# Patient Record
Sex: Male | Born: 1955
Health system: Southern US, Community
[De-identification: ages and names within clinical notes are randomized; demographics above are authoritative.]

## PROBLEM LIST (undated history)

## (undated) DIAGNOSIS — M5020 Other cervical disc displacement, unspecified cervical region: Secondary | ICD-10-CM

## (undated) DIAGNOSIS — I10 Essential (primary) hypertension: Secondary | ICD-10-CM

## (undated) DIAGNOSIS — K469 Unspecified abdominal hernia without obstruction or gangrene: Secondary | ICD-10-CM

## (undated) HISTORY — PX: OTHER SURGICAL HISTORY: SHX169

## (undated) HISTORY — PX: DENTAL SURGERY: SHX609

---

## 2001-11-19 ENCOUNTER — Encounter: Payer: Self-pay | Admitting: Emergency Medicine

## 2001-11-19 ENCOUNTER — Emergency Department (HOSPITAL_COMMUNITY): Admission: EM | Admit: 2001-11-19 | Discharge: 2001-11-19 | Payer: Self-pay | Admitting: Emergency Medicine

## 2010-03-01 ENCOUNTER — Emergency Department (HOSPITAL_COMMUNITY)
Admission: EM | Admit: 2010-03-01 | Discharge: 2010-03-01 | Payer: Self-pay | Source: Home / Self Care | Admitting: Emergency Medicine

## 2010-09-15 ENCOUNTER — Emergency Department (HOSPITAL_COMMUNITY): Payer: Medicare Other

## 2010-09-15 ENCOUNTER — Emergency Department (HOSPITAL_COMMUNITY)
Admission: EM | Admit: 2010-09-15 | Discharge: 2010-09-15 | Disposition: A | Discharge status: Home / Self Care | Payer: Medicare Other | Attending: Emergency Medicine | Admitting: Emergency Medicine

## 2010-09-15 DIAGNOSIS — I10 Essential (primary) hypertension: Secondary | ICD-10-CM | POA: Insufficient documentation

## 2010-09-15 DIAGNOSIS — M79609 Pain in unspecified limb: Secondary | ICD-10-CM | POA: Insufficient documentation

## 2010-09-15 DIAGNOSIS — M542 Cervicalgia: Secondary | ICD-10-CM | POA: Insufficient documentation

## 2010-09-15 DIAGNOSIS — M25519 Pain in unspecified shoulder: Secondary | ICD-10-CM | POA: Insufficient documentation

## 2010-09-15 DIAGNOSIS — M545 Low back pain, unspecified: Secondary | ICD-10-CM | POA: Insufficient documentation

## 2010-09-15 DIAGNOSIS — Z79899 Other long term (current) drug therapy: Secondary | ICD-10-CM | POA: Insufficient documentation

## 2010-09-15 DIAGNOSIS — M539 Dorsopathy, unspecified: Secondary | ICD-10-CM | POA: Insufficient documentation

## 2017-01-04 ENCOUNTER — Encounter (HOSPITAL_COMMUNITY): Payer: Self-pay | Admitting: Emergency Medicine

## 2017-01-04 ENCOUNTER — Inpatient Hospital Stay (HOSPITAL_COMMUNITY)
Admission: EM | Admit: 2017-01-04 | Discharge: 2017-01-07 | DRG: 442 | Disposition: A | Discharge status: Home / Self Care | Payer: Medicare Other | Attending: Nephrology | Admitting: Nephrology

## 2017-01-04 ENCOUNTER — Emergency Department (HOSPITAL_COMMUNITY): Payer: Medicare Other

## 2017-01-04 DIAGNOSIS — K7460 Unspecified cirrhosis of liver: Secondary | ICD-10-CM

## 2017-01-04 DIAGNOSIS — B182 Chronic viral hepatitis C: Secondary | ICD-10-CM | POA: Diagnosis not present

## 2017-01-04 DIAGNOSIS — Z87891 Personal history of nicotine dependence: Secondary | ICD-10-CM

## 2017-01-04 DIAGNOSIS — D6959 Other secondary thrombocytopenia: Secondary | ICD-10-CM | POA: Diagnosis present

## 2017-01-04 DIAGNOSIS — I10 Essential (primary) hypertension: Secondary | ICD-10-CM | POA: Diagnosis present

## 2017-01-04 DIAGNOSIS — R140 Abdominal distension (gaseous): Secondary | ICD-10-CM | POA: Diagnosis not present

## 2017-01-04 DIAGNOSIS — D696 Thrombocytopenia, unspecified: Secondary | ICD-10-CM | POA: Diagnosis present

## 2017-01-04 DIAGNOSIS — R042 Hemoptysis: Secondary | ICD-10-CM | POA: Diagnosis present

## 2017-01-04 DIAGNOSIS — E876 Hypokalemia: Secondary | ICD-10-CM | POA: Diagnosis present

## 2017-01-04 DIAGNOSIS — J9811 Atelectasis: Secondary | ICD-10-CM | POA: Diagnosis present

## 2017-01-04 DIAGNOSIS — R109 Unspecified abdominal pain: Secondary | ICD-10-CM | POA: Diagnosis present

## 2017-01-04 DIAGNOSIS — R601 Generalized edema: Secondary | ICD-10-CM | POA: Diagnosis present

## 2017-01-04 DIAGNOSIS — R945 Abnormal results of liver function studies: Secondary | ICD-10-CM | POA: Diagnosis present

## 2017-01-04 DIAGNOSIS — R188 Other ascites: Secondary | ICD-10-CM

## 2017-01-04 DIAGNOSIS — Z8249 Family history of ischemic heart disease and other diseases of the circulatory system: Secondary | ICD-10-CM

## 2017-01-04 DIAGNOSIS — D689 Coagulation defect, unspecified: Secondary | ICD-10-CM | POA: Diagnosis present

## 2017-01-04 DIAGNOSIS — Z91030 Bee allergy status: Secondary | ICD-10-CM

## 2017-01-04 HISTORY — DX: Unspecified abdominal hernia without obstruction or gangrene: K46.9

## 2017-01-04 HISTORY — DX: Other cervical disc displacement, unspecified cervical region: M50.20

## 2017-01-04 LAB — COMPREHENSIVE METABOLIC PANEL
ALBUMIN: 2.6 g/dL — AB (ref 3.5–5.0)
ALK PHOS: 77 U/L (ref 38–126)
ALT: 21 U/L (ref 17–63)
ANION GAP: 6 (ref 5–15)
AST: 60 U/L — ABNORMAL HIGH (ref 15–41)
BUN: 6 mg/dL (ref 6–20)
CALCIUM: 8.1 mg/dL — AB (ref 8.9–10.3)
CO2: 27 mmol/L (ref 22–32)
CREATININE: 1.01 mg/dL (ref 0.61–1.24)
Chloride: 103 mmol/L (ref 101–111)
GFR calc Af Amer: >60 mL/min (ref >60)
GFR calc non Af Amer: >60 mL/min (ref >60)
GLUCOSE: 120 mg/dL — AB (ref 65–99)
Potassium: 3 mmol/L — ABNORMAL LOW (ref 3.5–5.1)
SODIUM: 136 mmol/L (ref 135–145)
Total Bilirubin: 2.5 mg/dL — ABNORMAL HIGH (ref 0.3–1.2)
Total Protein: 8.2 g/dL — ABNORMAL HIGH (ref 6.5–8.1)

## 2017-01-04 LAB — CBC WITH DIFFERENTIAL/PLATELET
BASOS PCT: 1 %
Basophils Absolute: 0 10*3/uL (ref 0.0–0.1)
EOS ABS: 0.2 10*3/uL (ref 0.0–0.7)
Eosinophils Relative: 5 %
HCT: 37.4 % — ABNORMAL LOW (ref 39.0–52.0)
HEMOGLOBIN: 12.8 g/dL — AB (ref 13.0–17.0)
Lymphocytes Relative: 37 %
Lymphs Abs: 1.3 10*3/uL (ref 0.7–4.0)
MCH: 34.2 pg — ABNORMAL HIGH (ref 26.0–34.0)
MCHC: 34.2 g/dL (ref 30.0–36.0)
MCV: 100 fL (ref 78.0–100.0)
Monocytes Absolute: 0.3 10*3/uL (ref 0.1–1.0)
Monocytes Relative: 8 %
NEUTROS PCT: 49 %
Neutro Abs: 1.7 10*3/uL (ref 1.7–7.7)
Platelets: 129 10*3/uL — ABNORMAL LOW (ref 150–400)
RBC: 3.74 MIL/uL — AB (ref 4.22–5.81)
RDW: 14.2 % (ref 11.5–15.5)
WBC: 3.5 10*3/uL — AB (ref 4.0–10.5)

## 2017-01-04 LAB — LIPASE, BLOOD: Lipase: 28 U/L (ref 11–51)

## 2017-01-04 NOTE — ED Provider Notes (Signed)
[facility] DEPT Provider Note   CSN: [id number] Arrival date & time: 01/04/17  1535     History   Chief Complaint Chief Complaint  Patient presents with  . Abdominal Distention  . Shortness of Breath    HPI Jerry Mcintyre is a 61 y.o. male.  The history is provided by the patient.  Shortness of Breath  This is a chronic problem. The average episode lasts 6 weeks. The problem occurs frequently.The current episode started more than 1 week ago. The problem has been gradually worsening. Associated symptoms include cough, sputum production, hemoptysis, chest pain, abdominal pain and leg swelling. Pertinent negatives include no fever, no syncope and no vomiting. He has tried nothing for the symptoms. Associated medical issues comments: HTN.   Patient reports progressive worsening shortness of breath, abdominal distention, chest fullness starting over 6 weeks ago No fevers, no chills He also reports lower extremity edema He has never had this before He denies known history of liver disease, denies alcohol abuse, denies history of hepatitis Today he coughed a few times that produced blood and sputum, and his family suggesting go to the emergency department Reports quitting smoking several weeks ago  Past Medical History:  Diagnosis Date  . Hernia, abdominal   . 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   History reviewed. No pertinent surgical history.     Home Medications    Prior to Admission medications   Not on File    Family History No family history on file.  Social History Social History   Tobacco Use  . Smoking status: Former Smoker    Last attempt to quit: 11/01/2016    Years since quitting: 0.1  . Smokeless tobacco: Former Network engineer Use Topics  . Alcohol use: Yes    Comment: "drink a beer in the summertime"  . Drug use: No     Allergies   Patient has no known  allergies.   Review of Systems Review of Systems  Constitutional: Positive for unexpected weight change. Negative for fever.  Respiratory: Positive for cough, hemoptysis, sputum production, chest tightness and shortness of breath.   Cardiovascular: Positive for chest pain and leg swelling. Negative for syncope.  Gastrointestinal: Positive for abdominal pain and diarrhea. Negative for vomiting.  All other systems reviewed and are negative.    Physical Exam Updated Vital Signs BP (!) 149/99 (BP Location: Left Arm)   Pulse 74   Temp 98.9 F (37.2 C) (Oral)   Resp 20   Ht 1.778 m (5\' 10" )   Wt 99.8 kg (220 lb)   SpO2 99%   BMI 31.57 kg/m   Physical Exam CONSTITUTIONAL: Chronically ill-appearing HEAD: Normocephalic/atraumatic EYES: EOMI/PERRL, mild scleral icterus ENMT: Mucous membranes moist NECK: supple no meningeal signs SPINE/BACK:entire spine nontender CV: S1/S2 noted, no murmurs/rubs/gallops noted LUNGS: Lungs are clear to auscultation bilaterally, no apparent distress ABDOMEN: soft, significant abdominal distention noted is ascites, no rebound or guarding and no focal tenderness GU:no cva tenderness NEURO: Pt is awake/alert/appropriate, moves all extremitiesx4.  No facial droop.   EXTREMITIES: pulses normal/equal, full ROM, pitting edema noted bilateral lower extremities SKIN: warm, color normal PSYCH: no abnormalities of mood noted, alert and oriented to situation   ED Treatments / Results  Labs (all labs ordered are listed, but only abnormal results are displayed) Labs Reviewed  CBC WITH DIFFERENTIAL/PLATELET - Abnormal; Notable for the following components:  Result Value   WBC 3.5 (*)    RBC 3.74 (*)    Hemoglobin 12.8 (*)    HCT 37.4 (*)    MCH 34.2 (*)    Platelets 129 (*)    All other components within normal limits  COMPREHENSIVE METABOLIC PANEL - Abnormal; Notable for the following components:   Potassium 3.0 (*)    Glucose, Bld 120 (*)     Calcium 8.1 (*)    Total Protein 8.2 (*)    Albumin 2.6 (*)    AST 60 (*)    Total Bilirubin 2.5 (*)    All other components within normal limits  LIPASE, BLOOD  PROTIME-INR  HEPATITIS PANEL, ACUTE    EKG  EKG Interpretation  Date/Time:  Tuesday January 04 2017 15:45:19 EST Ventricular Rate:  85 PR Interval:    QRS Duration: 91 QT Interval:  369 QTC Calculation: 439 R Axis:   3 Text Interpretation:  Sinus rhythm Atrial premature complex Low voltage, precordial leads Nonspecific T abnormalities, diffuse leads Abnormal ekg Confirmed by Ripley Fraise [id number]) on 01/04/2017 11:09:10 PM       Radiology Dg Abd Acute W/chest  Result Date: 01/04/2017 CLINICAL DATA:  Abdominal swelling and distension, RIGHT upper quadrant abdominal pain with a roofing tearing nature, shortness of breath, hemoptysis EXAM: DG ABDOMEN ACUTE W/ 1V CHEST COMPARISON:  Chest radiograph 03/01/2010 FINDINGS: Normal heart size and pulmonary vascularity. Calcified tortuous thoracic aorta. Elevation of RIGHT diaphragm with RIGHT basilar atelectasis. Mild central peribronchial thickening. Lungs otherwise clear. No acute infiltrate, pleural effusion or pneumothorax. Increased attenuation of the abdomen with central displacement of bowel loops question ascites. Air-filled small bowel loops in the mid abdomen are nonspecific. Scattered gas in transverse colon and rectum. No definite bowel wall thickening or free air. Mild degenerative changes of the lumbar spine. No urine tract calcification. IMPRESSION: RIGHT basilar atelectasis. Probable ascites. Few air-filled minimally prominent loops of small bowel in the mid abdomen with some colonic gas present. Pattern is nonspecific, potentially ileus, obstruction considered less likely. Electronically Signed   By: Lavonia Dana M.D.   On: 01/04/2017 16:45    Procedures Procedures (including critical care time)  Medications Ordered in ED Medications - No data to  display   Initial Impression / Assessment and Plan / ED Course  I have reviewed the triage vital signs and the nursing notes.  Pertinent labs & imaging results that were available during my care of the patient were reviewed by me and considered in my medical decision making (see chart for details).     11:46 PM Patient with probable cirrhosis resulting in significant ascites and massive abdominal distention, which is causing his shortness of breath and chest fullness He has no idea he had liver disease Patient will be admitted for further evaluation testing and will require large volume paracentesis while in hospital. At this point he has no evidence of spontaneous bacterial peritonitis 12:32 AM Discussed with Dr. Blaine Hamper Will admit for cirrhosis workup Final Clinical Impressions(s) / ED Diagnoses   Final diagnoses:  Other ascites  Abdominal distention  Cirrhosis of liver with ascites, unspecified hepatic cirrhosis type [facility])    ED Discharge Orders    None       Ripley Fraise, MD 01/05/17 [id number]

## 2017-01-04 NOTE — ED Triage Notes (Signed)
Patient reports he has been feeling this way for past 6-7 weeks. C/o abdominal distention and edema to lower extremities that is now causing him to have difficulty breathing. Patient states today he has noticed some blood in his phlegm. Last BM today. Denies emesis. Pt states he quit smoking when this began and had smoked for 50+ years.   Abdomen is hard and distended. Tender to touch.

## 2017-01-05 ENCOUNTER — Inpatient Hospital Stay (HOSPITAL_COMMUNITY): Payer: Medicare Other

## 2017-01-05 ENCOUNTER — Other Ambulatory Visit: Payer: Self-pay

## 2017-01-05 ENCOUNTER — Encounter (HOSPITAL_COMMUNITY): Payer: Self-pay | Admitting: Internal Medicine

## 2017-01-05 DIAGNOSIS — E876 Hypokalemia: Secondary | ICD-10-CM | POA: Diagnosis present

## 2017-01-05 DIAGNOSIS — R601 Generalized edema: Secondary | ICD-10-CM | POA: Diagnosis present

## 2017-01-05 DIAGNOSIS — Z8249 Family history of ischemic heart disease and other diseases of the circulatory system: Secondary | ICD-10-CM | POA: Diagnosis not present

## 2017-01-05 DIAGNOSIS — R1011 Right upper quadrant pain: Secondary | ICD-10-CM

## 2017-01-05 DIAGNOSIS — R188 Other ascites: Secondary | ICD-10-CM | POA: Diagnosis present

## 2017-01-05 DIAGNOSIS — R945 Abnormal results of liver function studies: Secondary | ICD-10-CM | POA: Diagnosis present

## 2017-01-05 DIAGNOSIS — D6959 Other secondary thrombocytopenia: Secondary | ICD-10-CM | POA: Diagnosis present

## 2017-01-05 DIAGNOSIS — K7689 Other specified diseases of liver: Secondary | ICD-10-CM | POA: Diagnosis not present

## 2017-01-05 DIAGNOSIS — D696 Thrombocytopenia, unspecified: Secondary | ICD-10-CM | POA: Diagnosis not present

## 2017-01-05 DIAGNOSIS — R042 Hemoptysis: Secondary | ICD-10-CM

## 2017-01-05 DIAGNOSIS — K7460 Unspecified cirrhosis of liver: Secondary | ICD-10-CM

## 2017-01-05 DIAGNOSIS — Z87891 Personal history of nicotine dependence: Secondary | ICD-10-CM | POA: Diagnosis not present

## 2017-01-05 DIAGNOSIS — R109 Unspecified abdominal pain: Secondary | ICD-10-CM | POA: Diagnosis present

## 2017-01-05 DIAGNOSIS — J9811 Atelectasis: Secondary | ICD-10-CM | POA: Diagnosis present

## 2017-01-05 DIAGNOSIS — I10 Essential (primary) hypertension: Secondary | ICD-10-CM | POA: Diagnosis present

## 2017-01-05 DIAGNOSIS — R140 Abdominal distension (gaseous): Secondary | ICD-10-CM | POA: Diagnosis present

## 2017-01-05 DIAGNOSIS — B182 Chronic viral hepatitis C: Secondary | ICD-10-CM | POA: Diagnosis present

## 2017-01-05 DIAGNOSIS — D689 Coagulation defect, unspecified: Secondary | ICD-10-CM | POA: Diagnosis present

## 2017-01-05 DIAGNOSIS — Z91030 Bee allergy status: Secondary | ICD-10-CM | POA: Diagnosis not present

## 2017-01-05 LAB — URINALYSIS, ROUTINE W REFLEX MICROSCOPIC
Glucose, UA: NEGATIVE mg/dL
Hgb urine dipstick: NEGATIVE
Ketones, ur: NEGATIVE mg/dL
Leukocytes, UA: NEGATIVE
Nitrite: NEGATIVE
PH: 5 (ref 5.0–8.0)
Protein, ur: 30 mg/dL — AB
SPECIFIC GRAVITY, URINE: 1.028 (ref 1.005–1.030)

## 2017-01-05 LAB — COMPREHENSIVE METABOLIC PANEL
ALBUMIN: 2.3 g/dL — AB (ref 3.5–5.0)
ALK PHOS: 79 U/L (ref 38–126)
ALT: 20 U/L (ref 17–63)
AST: 59 U/L — AB (ref 15–41)
Anion gap: 6 (ref 5–15)
BUN: 6 mg/dL (ref 6–20)
CHLORIDE: 104 mmol/L (ref 101–111)
CO2: 25 mmol/L (ref 22–32)
CREATININE: 0.73 mg/dL (ref 0.61–1.24)
Calcium: 7.8 mg/dL — ABNORMAL LOW (ref 8.9–10.3)
GFR calc Af Amer: >60 mL/min (ref >60)
GFR calc non Af Amer: >60 mL/min (ref >60)
GLUCOSE: 105 mg/dL — AB (ref 65–99)
Potassium: 4.1 mmol/L (ref 3.5–5.1)
SODIUM: 135 mmol/L (ref 135–145)
Total Bilirubin: 1.8 mg/dL — ABNORMAL HIGH (ref 0.3–1.2)
Total Protein: 7.4 g/dL (ref 6.5–8.1)

## 2017-01-05 LAB — CBC
HCT: 34.2 % — ABNORMAL LOW (ref 39.0–52.0)
Hemoglobin: 12 g/dL — ABNORMAL LOW (ref 13.0–17.0)
MCH: 34.6 pg — AB (ref 26.0–34.0)
MCHC: 35.1 g/dL (ref 30.0–36.0)
MCV: 98.6 fL (ref 78.0–100.0)
PLATELETS: 117 10*3/uL — AB (ref 150–400)
RBC: 3.47 MIL/uL — ABNORMAL LOW (ref 4.22–5.81)
RDW: 14.4 % (ref 11.5–15.5)
WBC: 4.9 10*3/uL (ref 4.0–10.5)

## 2017-01-05 LAB — BODY FLUID CELL COUNT WITH DIFFERENTIAL
Lymphs, Fluid: 64 %
MONOCYTE-MACROPHAGE-SEROUS FLUID: 36 % — AB (ref 50–90)
WBC FLUID: 203 uL (ref 0–1000)

## 2017-01-05 LAB — APTT: aPTT: 28 seconds (ref 24–36)

## 2017-01-05 LAB — BILIRUBIN, DIRECT: BILIRUBIN DIRECT: 0.9 mg/dL — AB (ref 0.1–0.5)

## 2017-01-05 LAB — GLUCOSE, PLEURAL OR PERITONEAL FLUID: Glucose, Fluid: 108 mg/dL

## 2017-01-05 LAB — PROTEIN, PLEURAL OR PERITONEAL FLUID: Total protein, fluid: <3 g/dL

## 2017-01-05 LAB — AMMONIA: AMMONIA: 40 umol/L — AB (ref 9–35)

## 2017-01-05 LAB — PROTIME-INR
INR: 1.72
PROTHROMBIN TIME: 20.1 s — AB (ref 11.4–15.2)

## 2017-01-05 LAB — BRAIN NATRIURETIC PEPTIDE: B Natriuretic Peptide: 105.2 pg/mL — ABNORMAL HIGH (ref 0.0–100.0)

## 2017-01-05 LAB — MAGNESIUM: MAGNESIUM: 1.7 mg/dL (ref 1.7–2.4)

## 2017-01-05 LAB — HIV ANTIBODY (ROUTINE TESTING W REFLEX): HIV SCREEN 4TH GENERATION: NONREACTIVE

## 2017-01-05 MED ORDER — ONDANSETRON HCL 4 MG/2ML IJ SOLN: 4.0000 mg | Freq: Three times a day (TID) | INTRAMUSCULAR | Status: DC | PRN

## 2017-01-05 MED ORDER — IOPAMIDOL (ISOVUE-300) INJECTION 61%: 15.0000 mL | Freq: Two times a day (BID) | INTRAVENOUS | Status: DC | PRN

## 2017-01-05 MED ORDER — ZOLPIDEM TARTRATE 5 MG PO TABS: 5.0000 mg | ORAL_TABLET | Freq: Every evening | ORAL | Status: DC | PRN

## 2017-01-05 MED ORDER — LIDOCAINE HCL 2 % IJ SOLN
INTRAMUSCULAR | Status: AC
  Filled 2017-01-05: qty 10

## 2017-01-05 MED ORDER — MAGNESIUM SULFATE 2 GM/50ML IV SOLN
2.0000 g | Freq: Once | INTRAVENOUS | Status: AC
  Administered 2017-01-05: 2 g via INTRAVENOUS
  Filled 2017-01-05: qty 50

## 2017-01-05 MED ORDER — VITAMIN K1 10 MG/ML IJ SOLN
10.0000 mg | Freq: Once | INTRAVENOUS | Status: AC
  Administered 2017-01-05: 10 mg via INTRAVENOUS
  Filled 2017-01-05: qty 1

## 2017-01-05 MED ORDER — POTASSIUM CHLORIDE 20 MEQ/15ML (10%) PO SOLN
40.0000 meq | Freq: Once | ORAL | Status: AC
  Administered 2017-01-05: 40 meq via ORAL
  Filled 2017-01-05: qty 30

## 2017-01-05 MED ORDER — IOPAMIDOL (ISOVUE-300) INJECTION 61%
INTRAVENOUS | Status: AC
  Filled 2017-01-05: qty 100

## 2017-01-05 MED ORDER — MORPHINE SULFATE (PF) 4 MG/ML IV SOLN
2.0000 mg | INTRAVENOUS | Status: DC | PRN
  Administered 2017-01-05 (×2): 2 mg via INTRAVENOUS
  Filled 2017-01-05 (×2): qty 1

## 2017-01-05 MED ORDER — IOPAMIDOL (ISOVUE-300) INJECTION 61%
INTRAVENOUS | Status: AC
  Filled 2017-01-05: qty 30

## 2017-01-05 MED ORDER — IOPAMIDOL (ISOVUE-300) INJECTION 61%
100.0000 mL | Freq: Once | INTRAVENOUS | Status: AC | PRN
  Administered 2017-01-05: 100 mL via INTRAVENOUS

## 2017-01-05 MED ORDER — FUROSEMIDE 10 MG/ML IJ SOLN
40.0000 mg | Freq: Every day | INTRAMUSCULAR | Status: DC
  Administered 2017-01-05 – 2017-01-06 (×2): 40 mg via INTRAVENOUS
  Filled 2017-01-05 (×2): qty 4

## 2017-01-05 MED ORDER — LACTULOSE 10 GM/15ML PO SOLN
10.0000 g | Freq: Every day | ORAL | Status: DC
  Administered 2017-01-05 – 2017-01-07 (×3): 10 g via ORAL
  Filled 2017-01-05 (×3): qty 15

## 2017-01-05 MED ORDER — PANTOPRAZOLE SODIUM 40 MG IV SOLR
40.0000 mg | INTRAVENOUS | Status: DC
  Administered 2017-01-05 – 2017-01-06 (×2): 40 mg via INTRAVENOUS
  Filled 2017-01-05 (×2): qty 40

## 2017-01-05 MED ORDER — KCL IN DEXTROSE-NACL 20-5-0.9 MEQ/L-%-% IV SOLN
INTRAVENOUS | Status: DC
  Administered 2017-01-05: 18:00:00 via INTRAVENOUS
  Filled 2017-01-05: qty 1000

## 2017-01-05 MED ORDER — ALBUMIN HUMAN 25 % IV SOLN
25.0000 g | Freq: Four times a day (QID) | INTRAVENOUS | Status: AC
  Administered 2017-01-05 (×3): 25 g via INTRAVENOUS
  Filled 2017-01-05 (×3): qty 100

## 2017-01-05 MED ORDER — MORPHINE SULFATE (PF) 2 MG/ML IV SOLN
2.0000 mg | INTRAVENOUS | Status: DC | PRN
  Administered 2017-01-05: 2 mg via INTRAVENOUS
  Filled 2017-01-05: qty 1

## 2017-01-05 NOTE — Care Management Note (Signed)
Case Management Note  Patient Details  Name: Jerry Mcintyre MRN: [id number] Date of Birth: 02/16/1955  Subjective/Objective:                  ileus  Action/Plan: Date: January 05, 2017 Chart review for discharge needs:  None found for case management. Patient has no questions concerning post hospital care.  Expected Discharge Date:                  Expected Discharge Plan:  Home/Self Care  In-House Referral:     Discharge planning Services  CM Consult  Post Acute Care Choice:    Choice offered to:     DME Arranged:    DME Agency:     HH Arranged:    HH Agency:     Status of Service:  In process, will continue to follow  If discussed at Long Length of Stay Meetings, dates discussed:    Additional Comments:  Leeroy Cha, RN 01/05/2017, 9:26 AM

## 2017-01-05 NOTE — Progress Notes (Signed)
Patient left unit to have CT  Scan at 1410.

## 2017-01-05 NOTE — H&P (Signed)
History and Physical    Jerry Mcintyre NKN:[id number] DOB: [birth year] DOA: 01/04/2017  Referring MD/NP/PA:   PCP: Patient, No Pcp Per   Patient coming from:  The patient is coming from home.  At baseline, pt is independent for most of ADL.   Chief Complaint: Abdominal distention, abdominal pain and anasarca, hemoptysis.  HPI: Jerry Mcintyre is a 61 y.o. male without significant past medical history except for hernia, who presents with abdominal distention, abdominal pain, anasarca, hemoptysis.  Patient states that he noticed abdominal distention 6 weeks ago, which has been progressively getting worse. He also developed bilateral leg edema, and is brought into scrotum and abdomen, causing him to have difficulty breathing. Patient does not have chest pain or cough, but he states that he coughed up a little bright red blood this afternoon after he brushed teeth. He also reports abdominal pain, which is located in the right upper quadrant, constant, 10 out of 10 in severity, sharp, nonradiating. He denies nausea, vomiting, diarrhea or abdominal pain. Denies symptoms of UTI or unilateral weakness. Patient states that he drinks a little amount of the alcohol occasionally.  ED Course: pt was found to have abnormal liver function with ALP 6, AST 60, ALT 21, total bilirubin 2.5, WBC 3.5, platelets 129, lipase 28, pending urinalysis, temperature normal, no tachycardia, no tachypnea, oxygen saturation 99% on room air. Patient is admitted to telemetry bed as inpatient.  X ray of acute chest/abdomen showed:  1.RIGHT basilar atelectasis. 2. Probable ascites. 3.Few air-filled minimally prominent loops of small bowel in the midabdomen with some colonic gas present. Pattern is nonspecific, potentially ileus, obstruction considered less likely.  Review of Systems:   General: no fevers, chills, no body weight gain, has poor appetite, has fatigue HEENT: no blurry vision, hearing changes or sore  throat Respiratory: no dyspnea, coughing, wheezing CV: no chest pain, no palpitations GI: no nausea, vomiting,  diarrhea, constipation. Has abdominal pain and distension. GU: no dysuria, burning on urination, increased urinary frequency, hematuria  Ext: has  leg edema Neuro: no unilateral weakness, numbness, or tingling, no vision change or hearing loss Skin: no rash, no skin tear. MSK: No muscle spasm, no deformity, no limitation of range of movement in spin Heme: No easy bruising.  Travel history: No recent long distant travel.  Allergy:  Allergies  Allergen Reactions  . Bee Venom     Past Medical History:  Diagnosis Date  . Hernia, abdominal   . 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 DENTAL SURGERY      Social History:  reports that he quit smoking about 2 months ago. He has quit using smokeless tobacco. He reports that he drinks alcohol. He reports that he does not use drugs.  Family History:  Family History  Problem Relation Age of Onset  . Stroke Mother   . Hypertension Mother      Prior to Admission medications   Not on File    Physical Exam: Vitals:   01/04/17 1546 01/04/17 2310  BP: (!) 150/105 (!) 149/99  Pulse: 88 74  Resp: 18 20  Temp: 98.2 F (36.8 C) 98.9 F (37.2 C)  TempSrc: Oral Oral  SpO2: 98% 99%  Weight: 99.8 kg (220 lb)   Height: 5\' 10"  (1.778 m)    General: Not in acute distress. Has anasarca. HEENT:       Eyes: PERRL, EOMI, no scleral icterus.  ENT: No discharge from the ears and nose, no pharynx injection, no tonsillar enlargement.        Neck: No JVD, no bruit, no mass felt. Heme: No neck lymph node enlargement. Cardiac: S1/S2, RRR, No murmurs, No gallops or rubs. Respiratory: No rales, wheezing, rhonchi or rubs. GI: distended and tender in RUQ, no rebound pain, no organomegaly, BS present. GU: No hematuria Ext: 3+ pitting leg edema bilaterally. 2+DP/PT pulse  bilaterally. Musculoskeletal: No joint deformities, No joint redness or warmth, no limitation of ROM in spin. Skin: No rashes.  Neuro: Alert, oriented X3, cranial nerves II-XII grossly intact, moves all extremities normally. Psych: Patient is not psychotic, no suicidal or hemocidal ideation.  Labs on Admission: I have personally reviewed following labs and imaging studies  CBC: Recent Labs  Lab 01/04/17 1637  WBC 3.5*  NEUTROABS 1.7  HGB 12.8*  HCT 37.4*  MCV 100.0  PLT 024*   Basic Metabolic Panel: Recent Labs  Lab 01/04/17 1637  NA 136  K 3.0*  CL 103  CO2 27  GLUCOSE 120*  BUN 6  CREATININE 1.01  CALCIUM 8.1*   GFR: Estimated Creatinine Clearance: 88.6 mL/min (by C-G formula based on SCr of 1.01 mg/dL). Liver Function Tests: Recent Labs  Lab 01/04/17 1637  AST 60*  ALT 21  ALKPHOS 77  BILITOT 2.5*  PROT 8.2*  ALBUMIN 2.6*   Recent Labs  Lab 01/04/17 1637  LIPASE 28   No results for input(s): AMMONIA in the last 168 hours. Coagulation Profile: Recent Labs  Lab 01/05/17 0018  INR 1.72   Cardiac Enzymes: No results for input(s): CKTOTAL, CKMB, CKMBINDEX, TROPONINI in the last 168 hours. BNP (last 3 results) No results for input(s): PROBNP in the last 8760 hours. HbA1C: No results for input(s): HGBA1C in the last 72 hours. CBG: No results for input(s): GLUCAP in the last 168 hours. Lipid Profile: No results for input(s): CHOL, HDL, LDLCALC, TRIG, CHOLHDL, LDLDIRECT in the last 72 hours. Thyroid Function Tests: No results for input(s): TSH, T4TOTAL, FREET4, T3FREE, THYROIDAB in the last 72 hours. Anemia Panel: No results for input(s): VITAMINB12, FOLATE, FERRITIN, TIBC, IRON, RETICCTPCT in the last 72 hours. Urine analysis: No results found for: COLORURINE, APPEARANCEUR, LABSPEC, PHURINE, GLUCOSEU, HGBUR, BILIRUBINUR, KETONESUR, PROTEINUR, UROBILINOGEN, NITRITE, LEUKOCYTESUR Sepsis Labs: @LABRCNTIP (procalcitonin:4,lacticidven:4) )No results  found for this or any previous visit (from the past 240 hour(s)).   Radiological Exams on Admission: Dg Abd Acute W/chest  Result Date: 01/04/2017 CLINICAL DATA:  Abdominal swelling and distension, RIGHT upper quadrant abdominal pain with a roofing tearing nature, shortness of breath, hemoptysis EXAM: DG ABDOMEN ACUTE W/ 1V CHEST COMPARISON:  Chest radiograph 03/01/2010 FINDINGS: Normal heart size and pulmonary vascularity. Calcified tortuous thoracic aorta. Elevation of RIGHT diaphragm with RIGHT basilar atelectasis. Mild central peribronchial thickening. Lungs otherwise clear. No acute infiltrate, pleural effusion or pneumothorax. Increased attenuation of the abdomen with central displacement of bowel loops question ascites. Air-filled small bowel loops in the mid abdomen are nonspecific. Scattered gas in transverse colon and rectum. No definite bowel wall thickening or free air. Mild degenerative changes of the lumbar spine. No urine tract calcification. IMPRESSION: RIGHT basilar atelectasis. Probable ascites. Few air-filled minimally prominent loops of small bowel in the mid abdomen with some colonic gas present. Pattern is nonspecific, potentially ileus, obstruction considered less likely. Electronically Signed   By: Lavonia Dana M.D.   On: 01/04/2017 16:45     EKG: Independently reviewed. Sinus rhythm, QTC 439, low voltage, T-wave  inversion in V3-V6.   Assessment/Plan Principal Problem:   Ascites Active Problems:   Thrombocytopenia (HCC)   Hemoptysis   Abdominal pain   Abdominal distention   Hypokalemia   Abnormal liver function   Anasarca   Abdominal distention and abdominal pain due to ascites and anasarca, abnormal liver function and abdominal pain: The etiology is not clear. Patient likely has liver cirrhosis. He does not have drinker.  Differential diagnosis include viral hepatitis and NASH. Mental status normal, no sighs of hepatic encephalopathy. Patient has abdominal pain, which  is localized in right upper quadrant, less likely to have SBP.  -will admit to tele bed as inpt. -get US-RUQ -get CT-abd/pelvis with contrast -Hepatitis panel and HIV antibody -INT/PTT -please call IR for paracentesis in morning -prn Morphine for pain and zofran for nausea. -start low dose of lactulose, 10 g daily -f/u ammonia level -check BNP to r/o CHF which is less likely -check UA for proteinuria to rule out nephrotic syndrome  Thrombocytopenia ([place]): due to possible liver cirrhosis.  Patient reports that he had small amount of hemoptysis, but currently no active bleeding or bleeding tendency. -Follow-up by CBC  Hemoptysis: had small amount of hemoptysis per patient. X-ray of acute chest/abdomen did not show acute issues in chest. No CP . He has mild SOB, which is most likely due to extremely extended abdomen. -observe  Hypokalemia: K= 3.0  on admission. - Repleted - Check Mg level   DVT ppx: SCD Code Status: Full code Family Communication: None at bed side.    Disposition Plan:  Anticipate discharge back to previous home environment Consults called:  none Admission status: Inpatient/tele      Date of Service 01/05/2017    Ivor Costa [facility] Pager [phone]  If 7PM-7AM, please contact night-coverage [email or web address] Password [email or web address] 01/05/2017, 1:06 AM

## 2017-01-05 NOTE — ED Notes (Signed)
ED TO INPATIENT HANDOFF REPORT  Name/Age/Gender Jerry Mcintyre 61 y.o. male  Code Status    Code Status Orders  (From admission, onward)        Start     Ordered   01/05/17 0048  Full code  Continuous     01/05/17 0049    Code Status History    Date Active Date Inactive Code Status Order ID Comments User Context   This patient has a current code status but no historical code status.      Home/SNF/Other Home  Chief Complaint SOB, Blood in Saliva, Body swelling  Level of Care/Admitting Diagnosis ED Disposition    ED Disposition Condition Comment   Admit  Hospital Area: [facility] [[id number]]  Level of Care: Telemetry [5]  Admit to tele based on following criteria: Other see comments  Comments: Hypokalemia  Diagnosis: Anasarca [027253]  Admitting Physician: Ivor Costa [[id number]]  Attending Physician: Ivor Costa [phone]  Estimated length of stay: past midnight tomorrow  Certification:: I certify this patient will need inpatient services for at least 2 midnights  PT Class (Do Not Modify): Inpatient [101]  PT Acc Code (Do Not Modify): Private [1]       Medical History Past Medical History:  Diagnosis Date  . Hernia, abdominal   . 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 Bee Venom     IV Location/Drains/Wounds Patient Lines/Drains/Airways Status   Active Line/Drains/Airways    None          Labs/Imaging Results for orders placed or performed during the hospital encounter of 01/04/17 (from the past 48 hour(s))  CBC with Differential/Platelet     Status: Abnormal   Collection Time: 01/04/17  4:37 PM  Result Value Ref Range   WBC 3.5 (L) 4.0 - 10.5 K/uL   RBC 3.74 (L) 4.22 - 5.81 MIL/uL   Hemoglobin 12.8 (L) 13.0 - 17.0 g/dL   HCT 37.4 (L) 39.0 - 52.0 %   MCV 100.0 78.0 - 100.0 fL   MCH 34.2 (H) 26.0 - 34.0 pg   MCHC 34.2 30.0 - 36.0 g/dL   RDW 14.2 11.5 - 15.5 %   Platelets 129 (L) 150 - 400 K/uL   Neutrophils Relative % 49 %   Neutro Abs 1.7 1.7 - 7.7 K/uL   Lymphocytes Relative 37 %   Lymphs Abs 1.3 0.7 - 4.0 K/uL   Monocytes Relative 8 %   Monocytes Absolute 0.3 0.1 - 1.0 K/uL   Eosinophils Relative 5 %   Eosinophils Absolute 0.2 0.0 - 0.7 K/uL   Basophils Relative 1 %   Basophils Absolute 0.0 0.0 - 0.1 K/uL  Comprehensive metabolic panel     Status: Abnormal   Collection Time: 01/04/17  4:37 PM  Result Value Ref Range   Sodium 136 135 - 145 mmol/L   Potassium 3.0 (L) 3.5 - 5.1 mmol/L   Chloride 103 101 - 111 mmol/L   CO2 27 22 - 32 mmol/L   Glucose, Bld 120 (H) 65 - 99 mg/dL   BUN 6 6 - 20 mg/dL   Creatinine, Ser 1.01 0.61 - 1.24 mg/dL   Calcium 8.1 (L) 8.9 - 10.3 mg/dL   Total Protein 8.2 (H) 6.5 - 8.1 g/dL   Albumin 2.6 (L) 3.5 - 5.0 g/dL   AST 60 (H) 15 - 41 U/L   ALT 21 17 - 63 U/L   Alkaline Phosphatase 77 38 - 126  U/L   Total Bilirubin 2.5 (H) 0.3 - 1.2 mg/dL   GFR calc non Af Amer >60 >60 mL/min   GFR calc Af Amer >60 >60 mL/min    Comment: (NOTE) The eGFR has been calculated using the CKD EPI equation. This calculation has not been validated in all clinical situations. eGFR's persistently <60 mL/min signify possible Chronic Kidney Disease.    Anion gap 6 5 - 15  Lipase, blood     Status: None   Collection Time: 01/04/17  4:37 PM  Result Value Ref Range   Lipase 28 11 - 51 U/L  Protime-INR     Status: Abnormal   Collection Time: 01/05/17 12:18 AM  Result Value Ref Range   Prothrombin Time 20.1 (H) 11.4 - 15.2 seconds   INR 1.72   APTT     Status: None   Collection Time: 01/05/17 12:32 AM  Result Value Ref Range   aPTT 28 24 - 36 seconds  Brain natriuretic peptide     Status: Abnormal   Collection Time: 01/05/17 12:35 AM  Result Value Ref Range   B Natriuretic Peptide 105.2 (H) 0.0 - 100.0 pg/mL  Ammonia     Status: Abnormal   Collection Time: 01/05/17 12:37 AM  Result Value Ref Range   Ammonia 40 (H) 9 - 35 umol/L  Bilirubin, direct      Status: Abnormal   Collection Time: 01/05/17 12:52 AM  Result Value Ref Range   Bilirubin, Direct 0.9 (H) 0.1 - 0.5 mg/dL   Dg Abd Acute W/chest  Result Date: 01/04/2017 CLINICAL DATA:  Abdominal swelling and distension, RIGHT upper quadrant abdominal pain with a roofing tearing nature, shortness of breath, hemoptysis EXAM: DG ABDOMEN ACUTE W/ 1V CHEST COMPARISON:  Chest radiograph 03/01/2010 FINDINGS: Normal heart size and pulmonary vascularity. Calcified tortuous thoracic aorta. Elevation of RIGHT diaphragm with RIGHT basilar atelectasis. Mild central peribronchial thickening. Lungs otherwise clear. No acute infiltrate, pleural effusion or pneumothorax. Increased attenuation of the abdomen with central displacement of bowel loops question ascites. Air-filled small bowel loops in the mid abdomen are nonspecific. Scattered gas in transverse colon and rectum. No definite bowel wall thickening or free air. Mild degenerative changes of the lumbar spine. No urine tract calcification. IMPRESSION: RIGHT basilar atelectasis. Probable ascites. Few air-filled minimally prominent loops of small bowel in the mid abdomen with some colonic gas present. Pattern is nonspecific, potentially ileus, obstruction considered less likely. Electronically Signed   By: Lavonia Dana M.D.   On: 01/04/2017 16:45    Pending Labs Unresulted Labs (From admission, onward)   Start     Ordered   01/05/17 0500  Magnesium  Tomorrow morning,   R     01/05/17 0033   01/05/17 0500  Comprehensive metabolic panel  Tomorrow morning,   R     01/05/17 0049   01/05/17 0500  CBC  Tomorrow morning,   R     01/05/17 0049   01/05/17 0036  HIV antibody  Once,   R     01/05/17 0035   01/05/17 0035  Urinalysis, Routine w reflex microscopic  Once,   R     01/05/17 0034   01/04/17 2323  Hepatitis panel, acute  STAT,   STAT     01/04/17 2322      Vitals/Pain Today's Vitals   01/05/17 0345 01/05/17 0400 01/05/17 0441 01/05/17 0455  BP:   102/67    Pulse: 72 72    Resp: 14 (!) 9  Temp:      TempSrc:      SpO2: 97% 95%    Weight:      Height:      PainSc:   Asleep 5     Isolation Precautions No active isolations  Medications Medications  morphine 2 MG/ML injection 2 mg (2 mg Intravenous Given 01/05/17 0107)  lactulose (CHRONULAC) 10 GM/15ML solution 10 g (not administered)  ondansetron (ZOFRAN) injection 4 mg (not administered)  zolpidem (AMBIEN) tablet 5 mg (not administered)  potassium chloride 20 MEQ/15ML (10%) solution 40 mEq (40 mEq Oral Given 01/05/17 0107)    Mobility Ambulatory

## 2017-01-05 NOTE — Progress Notes (Addendum)
Patient was seen and examined at bedside.  Admitted after midnight.  Please see H&P for detail.  61 year old male with no significant past medical history presented with 6 weeks of worsening abdominal distention, abdominal pain, lower extremity edema and not feeling well.  Patient with elevated liver enzymes, bilirubin, elevated INR consistent with liver disease.  Patient has significant ascites on physical exam.  Follow-up acute hepatitis panel Ordered abdomen CT scan, right upper quadrant ultrasound Ultrasound paracentesis with fluid study Ordered vitamin K for coagulopathy.  Repeat INR in the morning. GI consult requested, discussed with Dr. Michail Sermon.  Ordered lasix 40 IV daily   Start IV fluid with dextrose+KCL since patient is n.p.o.  Ordered Protonix IV and continue supportive care.  Plan discussed with the patient and his sister at bedside. Repeat lab in the morning.

## 2017-01-05 NOTE — Procedures (Signed)
Ultrasound-guided diagnostic and therapeutic paracentesis performed yielding 6 liters of yellow fluid. No immediate complications.  A portion of the fluid was submitted to the lab for preordered studies.  Since this was the patient's initial paracentesis only the above amount of fluid was removed today.

## 2017-01-06 ENCOUNTER — Inpatient Hospital Stay (HOSPITAL_COMMUNITY): Payer: Medicare Other

## 2017-01-06 LAB — CBC
HCT: 36.2 % — ABNORMAL LOW (ref 39.0–52.0)
Hemoglobin: 12.1 g/dL — ABNORMAL LOW (ref 13.0–17.0)
MCH: 33.3 pg (ref 26.0–34.0)
MCHC: 33.4 g/dL (ref 30.0–36.0)
MCV: 99.7 fL (ref 78.0–100.0)
PLATELETS: 108 10*3/uL — AB (ref 150–400)
RBC: 3.63 MIL/uL — ABNORMAL LOW (ref 4.22–5.81)
RDW: 14.2 % (ref 11.5–15.5)
WBC: 6.4 10*3/uL (ref 4.0–10.5)

## 2017-01-06 LAB — HEPATITIS PANEL, ACUTE
HEP A IGM: NEGATIVE
HEP B C IGM: NEGATIVE
HEP B S AG: NEGATIVE

## 2017-01-06 LAB — COMPREHENSIVE METABOLIC PANEL
ALT: 15 U/L — ABNORMAL LOW (ref 17–63)
ANION GAP: 5 (ref 5–15)
AST: 40 U/L (ref 15–41)
Albumin: 3.1 g/dL — ABNORMAL LOW (ref 3.5–5.0)
Alkaline Phosphatase: 59 U/L (ref 38–126)
BUN: <5 mg/dL — ABNORMAL LOW (ref 6–20)
CALCIUM: 8.2 mg/dL — AB (ref 8.9–10.3)
CHLORIDE: 104 mmol/L (ref 101–111)
CO2: 28 mmol/L (ref 22–32)
CREATININE: 0.78 mg/dL (ref 0.61–1.24)
Glucose, Bld: 124 mg/dL — ABNORMAL HIGH (ref 65–99)
Potassium: 3.6 mmol/L (ref 3.5–5.1)
SODIUM: 137 mmol/L (ref 135–145)
Total Bilirubin: 3.4 mg/dL — ABNORMAL HIGH (ref 0.3–1.2)
Total Protein: 7.1 g/dL (ref 6.5–8.1)

## 2017-01-06 LAB — GRAM STAIN

## 2017-01-06 LAB — AMYLASE, PLEURAL OR PERITONEAL FLUID: AMYLASE FL: 13 U/L

## 2017-01-06 LAB — PROTIME-INR
INR: 1.57
PROTHROMBIN TIME: 18.6 s — AB (ref 11.4–15.2)

## 2017-01-06 LAB — GLUCOSE, CAPILLARY: Glucose-Capillary: 126 mg/dL — ABNORMAL HIGH (ref 65–99)

## 2017-01-06 MED ORDER — FUROSEMIDE 40 MG PO TABS
40.0000 mg | ORAL_TABLET | Freq: Every day | ORAL | Status: DC
  Administered 2017-01-07: 40 mg via ORAL
  Filled 2017-01-06: qty 1

## 2017-01-06 MED ORDER — ALBUMIN HUMAN 25 % IV SOLN
50.0000 g | Freq: Once | INTRAVENOUS | Status: AC
  Administered 2017-01-06: 50 g via INTRAVENOUS
  Filled 2017-01-06: qty 200

## 2017-01-06 MED ORDER — LIDOCAINE HCL 1 % IJ SOLN
INTRAMUSCULAR | Status: AC
  Filled 2017-01-06: qty 20

## 2017-01-06 MED ORDER — SPIRONOLACTONE 25 MG PO TABS
25.0000 mg | ORAL_TABLET | Freq: Every day | ORAL | Status: DC
  Administered 2017-01-07: 25 mg via ORAL
  Filled 2017-01-06: qty 1

## 2017-01-06 NOTE — Progress Notes (Signed)
PROGRESS NOTE    Jerry Mcintyre  VFI:[id number] DOB: 04/26/1955 DOA: 01/04/2017 PCP: Patient, No Pcp Per   Brief Narrative: 61 year old male with no significant past medical history presented with worsening abdominal distention, abdominal pain, anasarca for about 6 weeks.  Patient was found to have elevated liver enzymes, coagulopathy and ascites.  Admitted for further evaluation.  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  Now with worsening abdominal distention and edema. -Status post ultrasound-guided paracentesis with removal of 6 L of fluid.  No sign of infection. -Continue IV Lasix for the management of edema -Patient has hepatitis C antibody positive.  Likely needs treatment.  Follow-up GI consult and evaluation.  #Coagulopathy, thrombocytopenia, transaminase close in the setting of liver cirrhosis, hepatitis C.  Received a dose of vitamin K yesterday.  No sign of active bleeding.  Monitor labs, CBC.  #Hemoptysis on admission resolved now.  #Hypokalemia improved.   DVT prophylaxis: SCD.  Patient has coagulopathy Code Status: Full code Family Communication: Discussed with the patient's sister yesterday. Disposition Plan: Likely discharge home in 1-2 days.    Consultants:   GI  Procedures: CT scan, ultrasound paracentesis Antimicrobials: None  Subjective: Seen and examined at bedside.  Denies headache, dizziness, chest pain.  Abdominal pain and distention is better.  Has some nausea and decreased oral intake.  Not at baseline.  Objective: Vitals:   01/05/17 1505 01/05/17 1520 01/05/17 2103 01/06/17 0612  BP: 128/63 (!) 142/57 135/80 136/79  Pulse:   77 80  Resp:   20 20  Temp:   98.6 F (37 C) 99.2 F (37.3 C)  TempSrc:   Oral Oral  SpO2:   98% 97%  Weight:    95.3 kg (210 lb 1.6 oz)  Height:          Intake/Output Summary (Last 24 hours) at 01/06/2017 1228 Last data filed at 01/06/2017 0900 Gross per 24 hour  Intake 931.67 ml  Output 1000 ml  Net -68.33 ml   Filed Weights   01/04/17 1546 01/06/17 0612  Weight: 99.8 kg (220 lb) 95.3 kg (210 lb 1.6 oz)    Examination:  General exam: Appears calm and comfortable  Respiratory system: Clear to auscultation. Respiratory effort normal. No wheezing or crackle Cardiovascular system: S1 & S2 heard, RRR.  Lower extremities edema. Gastrointestinal system: Abdomen distention is better, firm, bowel sounds positive. Central nervous system: Alert and oriented. No focal neurological deficits. Extremities: Symmetric 5 x 5 power. Skin: No rashes, lesions or ulcers Psychiatry: Judgement and insight appear normal. Mood & affect appropriate.     Data Reviewed: I have personally reviewed following labs and imaging studies  CBC: Recent Labs  Lab 01/04/17 1637 01/05/17 0446 01/06/17 0540  WBC 3.5* 4.9 6.4  NEUTROABS 1.7  --   --   HGB 12.8* 12.0* 12.1*  HCT 37.4* 34.2* 36.2*  MCV 100.0 98.6 99.7  PLT 129* 117* 416*   Basic Metabolic Panel: Recent Labs  Lab 01/04/17 1637 01/05/17 0446 01/06/17 0540  NA 136 135 137  K 3.0* 4.1 3.6  CL 103 104 104  CO2 27 25 28   GLUCOSE 120* 105* 124*  BUN 6 6 <5*  CREATININE 1.01 0.73 0.78  CALCIUM 8.1* 7.8* 8.2*  MG  --  1.7  --    GFR: Estimated Creatinine Clearance: 109.5 mL/min (  by C-G formula based on SCr of 0.78 mg/dL). Liver Function Tests: Recent Labs  Lab 01/04/17 1637 01/05/17 0446 01/06/17 0540  AST 60* 59* 40  ALT 21 20 15*  ALKPHOS 77 79 59  BILITOT 2.5* 1.8* 3.4*  PROT 8.2* 7.4 7.1  ALBUMIN 2.6* 2.3* 3.1*   Recent Labs  Lab 01/04/17 1637  LIPASE 28   Recent Labs  Lab 01/05/17 0037  AMMONIA 40*   Coagulation Profile: Recent Labs  Lab 01/05/17 0018 01/06/17 0540  INR 1.72 1.57   Cardiac Enzymes: No results for input(s): CKTOTAL, CKMB, CKMBINDEX,  TROPONINI in the last 168 hours. BNP (last 3 results) No results for input(s): PROBNP in the last 8760 hours. HbA1C: No results for input(s): HGBA1C in the last 72 hours. CBG: Recent Labs  Lab 01/06/17 0749  GLUCAP 126*   Lipid Profile: No results for input(s): CHOL, HDL, LDLCALC, TRIG, CHOLHDL, LDLDIRECT in the last 72 hours. Thyroid Function Tests: No results for input(s): TSH, T4TOTAL, FREET4, T3FREE, THYROIDAB in the last 72 hours. Anemia Panel: No results for input(s): VITAMINB12, FOLATE, FERRITIN, TIBC, IRON, RETICCTPCT in the last 72 hours. Sepsis Labs: No results for input(s): PROCALCITON, LATICACIDVEN in the last 168 hours.  Recent Results (from the past 240 hour(s))  Culture, body fluid-bottle     Status: None (Preliminary result)   Collection Time: 01/05/17  2:55 PM  Result Value Ref Range Status   Specimen Description FLUID PERITONEAL  Final   Special Requests BOTTLES DRAWN AEROBIC AND ANAEROBIC  Final   Culture   Final    NO GROWTH < 24 HOURS Performed at [facility], [place] [address] [place], [place] [address]    Report Status PENDING  Incomplete  Gram stain     Status: None   Collection Time: 01/05/17  2:55 PM  Result Value Ref Range Status   Specimen Description FLUID PERITONEAL  Final   Special Requests NONE  Final   Gram Stain   Final    WBC PRESENT, PREDOMINANTLY MONONUCLEAR NO ORGANISMS SEEN CYTOSPIN SMEAR Performed at [facility], [place] [address] [place], [place] [address]    Report Status 01/06/2017 FINAL  Final         Radiology Studies: Ct Abdomen Pelvis W Contrast  Result Date: 01/05/2017 CLINICAL DATA:  Abdominal distension. Abnormal liver function. Ascites. EXAM: CT ABDOMEN AND PELVIS WITH CONTRAST TECHNIQUE: Multidetector CT imaging of the abdomen and pelvis was performed using the standard protocol following bolus administration of intravenous contrast. CONTRAST:  180mL ISOVUE-300 IOPAMIDOL (ISOVUE-300) INJECTION 61%  COMPARISON:  None. FINDINGS: Lower chest: Lung bases are clear. Hepatobiliary: Liver has a shrunken nodular contour. Portal veins are patent. Multiple gallstones and sludge within lumen gallbladder. No enhancing hepatic lesion. Pancreas: Pancreas is normal. No ductal dilatation. No pancreatic inflammation. Spleen: Spleen is normal volume. Adrenals/urinary tract: Adrenal glands and kidneys are normal. The ureters and bladder normal. Stomach/Bowel: Stomach, small-bowel, cecum normal. Appendix not identified. The colon and rectosigmoid colon are normal. Vascular/Lymphatic: Abdominal aorta is normal caliber. There is no retroperitoneal or periportal lymphadenopathy. No pelvic lymphadenopathy. Stable enlarged LEFT inguinal lymph node measuring 23 mm short axis Reproductive: Prostate normal Other: Moderate volume ascites throughout the peritoneal space Musculoskeletal: No aggressive osseous lesion. IMPRESSION: 1. Morphologic changes consistent cirrhosis. 2. Moderate volume ascites. 3. Normal volume spleen.  Minimal venous collateralization. 4. Solitary Enlarged LEFT inguinal lymph node of undetermined etiology. Lesion would be accessible for biopsy. Electronically Signed   By: Nicole Kindred  Leonia Reeves M.D.   On: 01/05/2017 16:30   US Paracentesis  Result Date: 01/05/2017 INDICATION: Abdominal pain/ distension, elevated liver function tests, ascites. Request made for diagnostic and therapeutic paracentesis. EXAM: ULTRASOUND GUIDED DIAGNOSTIC AND THERAPEUTIC PARACENTESIS MEDICATIONS: None. COMPLICATIONS: None immediate. PROCEDURE: Informed written consent was obtained from the patient after a discussion of the risks, benefits and alternatives to treatment. A timeout was performed prior to the initiation of the procedure. Initial ultrasound scanning demonstrates a large amount of ascites within the left lower abdominal quadrant. The left lower abdomen was prepped and draped in the usual sterile fashion. 2% lidocaine was used for  local anesthesia. Following this, a Yueh catheter was introduced. An ultrasound image was saved for documentation purposes. The paracentesis was performed. The catheter was removed and a dressing was applied. The patient tolerated the procedure well without immediate post procedural complication. FINDINGS: A total of approximately 6 liters of yellow fluid was removed. Samples were sent to the laboratory as requested by the clinical team. Since this was the patient's initial paracentesis only the above amount of fluid was removed today. IMPRESSION: Successful ultrasound-guided diagnostic and therapeutic paracentesis yielding 6 liters of peritoneal fluid. Read by: Rowe Robert, PA-C Electronically Signed   By: Marybelle Killings M.D.   On: 01/05/2017 15:38   Dg Abd Acute W/chest  Result Date: 01/04/2017 CLINICAL DATA:  Abdominal swelling and distension, RIGHT upper quadrant abdominal pain with a roofing tearing nature, shortness of breath, hemoptysis EXAM: DG ABDOMEN ACUTE W/ 1V CHEST COMPARISON:  Chest radiograph 03/01/2010 FINDINGS: Normal heart size and pulmonary vascularity. Calcified tortuous thoracic aorta. Elevation of RIGHT diaphragm with RIGHT basilar atelectasis. Mild central peribronchial thickening. Lungs otherwise clear. No acute infiltrate, pleural effusion or pneumothorax. Increased attenuation of the abdomen with central displacement of bowel loops question ascites. Air-filled small bowel loops in the mid abdomen are nonspecific. Scattered gas in transverse colon and rectum. No definite bowel wall thickening or free air. Mild degenerative changes of the lumbar spine. No urine tract calcification. IMPRESSION: RIGHT basilar atelectasis. Probable ascites. Few air-filled minimally prominent loops of small bowel in the mid abdomen with some colonic gas present. Pattern is nonspecific, potentially ileus, obstruction considered less likely. Electronically Signed   By: Lavonia Dana M.D.   On: 01/04/2017 16:45    US Abdomen Limited Ruq  Result Date: 01/05/2017 CLINICAL DATA:  Abdominal distention with elevated liver enzymes EXAM: ULTRASOUND ABDOMEN LIMITED RIGHT UPPER QUADRANT COMPARISON:  None. FINDINGS: Gallbladder: Sludge is noted within the gallbladder. There are no echogenic foci which move and shadow as is expected with gallstones. The gallbladder wall is thickened without obvious pericholecystic fluid. No sonographic Murphy sign noted by sonographer. Common bile duct: Diameter: 6 mm. No intrahepatic or extrahepatic biliary duct dilatation. Liver: No focal lesion identified. Liver echogenicity is increased diffusely. The liver is small with a nodular contour consistent with underlying cirrhosis. Portal vein is patent on color Doppler imaging with normal direction of blood flow towards the liver. There is fairly extensive ascites. IMPRESSION: 1. There is thickening of the gallbladder wall with sludge present. No gallstones are evident. No pericholecystic fluid is appreciable. There is ascites which may cause gallbladder wall thickening. A degree of acalculus cholecystitis is difficult to exclude in this circumstance, however. This finding may warrant nuclear medicine hepatobiliary imaging study to assess for cystic duct patency given this circumstance. 2. Liver is small with nodular contour increased echogenicity consistent with hepatic cirrhosis. While no focal liver lesions are evident on this  study, it must be cautioned that the sensitivity of ultrasound for detection of focal liver lesions is diminished significantly in this circumstance. Electronically Signed   By: Lowella Grip III M.D.   On: 01/05/2017 14:52        Scheduled Meds: . furosemide  40 mg Intravenous Daily  . lactulose  10 g Oral Daily  . pantoprazole (PROTONIX) IV  40 mg Intravenous Q24H   Continuous Infusions:   LOS: 1 day    Dron Tanna Furry, MD [facility] Pager [phone]  If 7PM-7AM, please contact  night-coverage [email or web address] Password [email or web address] 01/06/2017, 12:28 PM

## 2017-01-06 NOTE — Progress Notes (Signed)
PT Cancellation Note  Patient Details Name: RAYMONDO GARCIALOPEZ MRN: [id number] DOB: 1955-11-12   Cancelled Treatment:    Reason Eval/Treat Not Completed: PT screened, no needs identified, will sign off. Per OT, no needs.   Claretha Cooper 01/06/2017, 11:43 AM  Tresa Endo PT [phone]

## 2017-01-06 NOTE — Procedures (Signed)
Ultrasound-guided  therapeutic paracentesis performed yielding 6 liters (maximum ordered) of yellow  fluid. No immediate complications.  

## 2017-01-06 NOTE — Progress Notes (Signed)
Occupational Therapy Evaluation Patient Details Name: Jerry Mcintyre MRN: [id number] DOB: 22-Jun-1955 Today's Date: 01/06/2017    History of Present Illness 61 year old male with no significant past medical history presented with worsening abdominal distention, abdominal pain, anasarca for about 6 weeks.  Patient was found to have elevated liver enzymes, coagulopathy and ascites.   Clinical Impression   Patient presents to OT at independent level for ADLs and mobility. No OT needs identified. Will sign off. Encouraged patient to ambulate in hallway.    Follow Up Recommendations  No OT follow up    Equipment Recommendations  None recommended by OT    Recommendations for Other Services       Precautions / Restrictions Precautions Precautions: None Restrictions Weight Bearing Restrictions: No      Mobility Bed Mobility Overal bed mobility: Independent                Transfers Overall transfer level: Independent Equipment used: None                  Balance Overall balance assessment: Independent                                         ADL either performed or assessed with clinical judgement   ADL Overall ADL's : Independent                                             Vision         Perception     Praxis      Pertinent Vitals/Pain Pain Assessment: 0-10 Pain Score: 7  Pain Location: abdomen Pain Descriptors / Indicators: Discomfort;Tender Pain Intervention(s): Limited activity within patient's tolerance;Monitored during session;Repositioned     Hand Dominance     Extremity/Trunk Assessment Upper Extremity Assessment Upper Extremity Assessment: Overall WFL for tasks assessed   Lower Extremity Assessment Lower Extremity Assessment: Overall WFL for tasks assessed   Cervical / Trunk Assessment Cervical / Trunk Assessment: Normal   Communication Communication Communication: No difficulties    Cognition Arousal/Alertness: Awake/alert Behavior During Therapy: WFL for tasks assessed/performed Overall Cognitive Status: Within Functional Limits for tasks assessed                                     General Comments  BLE edema    Exercises     Shoulder Instructions      Home Living Family/patient expects to be discharged to:: Private residence Living Arrangements: Alone   Type of Home: House Home Access: Ramped entrance     Home Layout: One level     Bathroom Shower/Tub: Teacher, [employer] years/pre: Standard     Home Equipment: Grab bars - toilet;Grab bars - tub/shower          Prior Functioning/Environment Level of Independence: Independent                 OT Problem List: Pain      OT Treatment/Interventions:      OT Goals(Current goals can be found in the care plan section) Acute Rehab OT Goals Patient Stated Goal: to get better and go home OT Goal Formulation: All assessment and education complete,  DC therapy  OT Frequency:     Barriers to D/C:            Co-evaluation              AM-PAC PT "6 Clicks" Daily Activity     Outcome Measure Help from another person eating meals?: None Help from another person taking care of personal grooming?: None Help from another person toileting, which includes using toliet, bedpan, or urinal?: None Help from another person bathing (including washing, rinsing, drying)?: None Help from another person to put on and taking off regular upper body clothing?: None Help from another person to put on and taking off regular lower body clothing?: None 6 Click Score: 24   End of Session Nurse Communication: Mobility status  Activity Tolerance: Patient tolerated treatment well Patient left: in bed;with call bell/phone within reach  OT Visit Diagnosis: Pain Pain - part of body: (abdomen)                Time: 8185-6314 OT Time Calculation (min): 22 min Charges:  OT General  Charges $OT Visit: 1 Visit OT Evaluation $OT Eval Low Complexity: 1 Low G-Codes:       Everley Evora A Yahmir Sokolov 01/18/17, 12:39 PM

## 2017-01-06 NOTE — Consult Note (Signed)
Referring Provider: Dr. Carolin Sicks Primary Care Physician:  Patient, No Pcp Per Primary Gastroenterologist:  Althia Forts  Reason for Consultation:  Cirrhosis; Anasarca  HPI: Jerry Mcintyre is a 61 y.o. male with onset of ascites and LE edema in October stating that the edema abruptly started the day he quit smoking on 11/20/16. Denies abdominal pain. Denies history of hepatitis. Occasional alcohol. Had tattoos placed in the mid-1980's. Denies blood transfusions and denies IV drug use. Hepatitis C Ab positive. Hep A, B negative. Plts 108, Albumin 2.3. INR 1.57. CT shows cirrhosis with moderate ascites. Paracentesis yesterday with 6 L removed and negative for spontaneous bacterial peritonitis.  Past Medical History:  Diagnosis Date  . Hernia, abdominal   . 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 DENTAL SURGERY      Prior to Admission medications   Not on File    Scheduled Meds: . furosemide  40 mg Intravenous Daily  . lactulose  10 g Oral Daily  . pantoprazole (PROTONIX) IV  40 mg Intravenous Q24H   Continuous Infusions: PRN Meds:.iopamidol, morphine injection, ondansetron, zolpidem  Allergies as of 01/04/2017  . (No Known Allergies)    Family History  Problem Relation Age of Onset  . Stroke Mother   . Hypertension Mother     Social History   Socioeconomic History  . Marital status: Single    Spouse name: Not on file  . Number of children: Not on file  . Years of education: Not on file  . Highest education level: Not on file  Social Needs  . Financial resource strain: Not on file  . Food insecurity - worry: Not on file  . Food insecurity - inability: Not on file  . Transportation needs - medical: Not on file  . Transportation needs - non-medical: Not on file  Occupational History  . Not on file  Tobacco Use  . Smoking status: Former Smoker    Last attempt to quit: 11/01/2016    Years since quitting: 0.1  . Smokeless  tobacco: Former Network engineer and Sexual Activity  . Alcohol use: Yes    Comment: "drink a beer in the summertime"  . Drug use: No  . Sexual activity: Not on file  Other Topics Concern  . Not on file  Social History Narrative  . Not on file    Review of Systems: All negative except as stated above in HPI.  Physical Exam: Vital signs: Vitals:   01/05/17 2103 01/06/17 0612  BP: 135/80 136/79  Pulse: 77 80  Resp: 20 20  Temp: 98.6 F (37 C) 99.2 F (37.3 C)  SpO2: 98% 97%   Last BM Date: 01/05/17 General:   Lethargic, disheveled, well-nourished, no acute distress Head: normocephalic, atraumatic Eyes: +icteric sclera ENT: oropharynx clear Neck: supple, nontender Lungs:  Clear throughout to auscultation.   No wheezes, crackles, or rhonchi. No acute distress. Heart:  Regular rate and rhythm; no murmurs, clicks, rubs,  or gallops. Abdomen: distended, diffusely tender with guarding, +BS Rectal:  Deferred Ext: 2+ pitting LE edema  GI:  Lab Results: Recent Labs    01/04/17 1637 01/05/17 0446 01/06/17 0540  WBC 3.5* 4.9 6.4  HGB 12.8* 12.0* 12.1*  HCT 37.4* 34.2* 36.2*  PLT 129* 117* 108*   BMET Recent Labs    01/04/17 1637 01/05/17 0446 01/06/17 0540  NA 136 135 137  K 3.0* 4.1 3.6  CL 103 104 104  CO2  27 25 28   GLUCOSE 120* 105* 124*  BUN 6 6 <5*  CREATININE 1.01 0.73 0.78  CALCIUM 8.1* 7.8* 8.2*   LFT Recent Labs    01/05/17 0052  01/06/17 0540  PROT  --    < > 7.1  ALBUMIN  --    < > 3.1*  AST  --    < > 40  ALT  --    < > 15*  ALKPHOS  --    < > 59  BILITOT  --    < > 3.4*  BILIDIR 0.9*  --   --    < > = values in this interval not displayed.   PT/INR Recent Labs    01/05/17 0018 01/06/17 0540  LABPROT 20.1* 18.6*  INR 1.72 1.57     Studies/Results: Ct Abdomen Pelvis W Contrast  Result Date: 01/05/2017 CLINICAL DATA:  Abdominal distension. Abnormal liver function. Ascites. EXAM: CT ABDOMEN AND PELVIS WITH CONTRAST TECHNIQUE:  Multidetector CT imaging of the abdomen and pelvis was performed using the standard protocol following bolus administration of intravenous contrast. CONTRAST:  133mL ISOVUE-300 IOPAMIDOL (ISOVUE-300) INJECTION 61% COMPARISON:  None. FINDINGS: Lower chest: Lung bases are clear. Hepatobiliary: Liver has a shrunken nodular contour. Portal veins are patent. Multiple gallstones and sludge within lumen gallbladder. No enhancing hepatic lesion. Pancreas: Pancreas is normal. No ductal dilatation. No pancreatic inflammation. Spleen: Spleen is normal volume. Adrenals/urinary tract: Adrenal glands and kidneys are normal. The ureters and bladder normal. Stomach/Bowel: Stomach, small-bowel, cecum normal. Appendix not identified. The colon and rectosigmoid colon are normal. Vascular/Lymphatic: Abdominal aorta is normal caliber. There is no retroperitoneal or periportal lymphadenopathy. No pelvic lymphadenopathy. Stable enlarged LEFT inguinal lymph node measuring 23 mm short axis Reproductive: Prostate normal Other: Moderate volume ascites throughout the peritoneal space Musculoskeletal: No aggressive osseous lesion. IMPRESSION: 1. Morphologic changes consistent cirrhosis. 2. Moderate volume ascites. 3. Normal volume spleen.  Minimal venous collateralization. 4. Solitary Enlarged LEFT inguinal lymph node of undetermined etiology. Lesion would be accessible for biopsy. Electronically Signed   By: Suzy Bouchard M.D.   On: 01/05/2017 16:30   US Paracentesis  Result Date: 01/05/2017 INDICATION: Abdominal pain/ distension, elevated liver function tests, ascites. Request made for diagnostic and therapeutic paracentesis. EXAM: ULTRASOUND GUIDED DIAGNOSTIC AND THERAPEUTIC PARACENTESIS MEDICATIONS: None. COMPLICATIONS: None immediate. PROCEDURE: Informed written consent was obtained from the patient after a discussion of the risks, benefits and alternatives to treatment. A timeout was performed prior to the initiation of the  procedure. Initial ultrasound scanning demonstrates a large amount of ascites within the left lower abdominal quadrant. The left lower abdomen was prepped and draped in the usual sterile fashion. 2% lidocaine was used for local anesthesia. Following this, a Yueh catheter was introduced. An ultrasound image was saved for documentation purposes. The paracentesis was performed. The catheter was removed and a dressing was applied. The patient tolerated the procedure well without immediate post procedural complication. FINDINGS: A total of approximately 6 liters of yellow fluid was removed. Samples were sent to the laboratory as requested by the clinical team. Since this was the patient's initial paracentesis only the above amount of fluid was removed today. IMPRESSION: Successful ultrasound-guided diagnostic and therapeutic paracentesis yielding 6 liters of peritoneal fluid. Read by: Rowe Robert, PA-C Electronically Signed   By: Marybelle Killings M.D.   On: 01/05/2017 15:38   Dg Abd Acute W/chest  Result Date: 01/04/2017 CLINICAL DATA:  Abdominal swelling and distension, RIGHT upper quadrant abdominal pain with a  roofing tearing nature, shortness of breath, hemoptysis EXAM: DG ABDOMEN ACUTE W/ 1V CHEST COMPARISON:  Chest radiograph 03/01/2010 FINDINGS: Normal heart size and pulmonary vascularity. Calcified tortuous thoracic aorta. Elevation of RIGHT diaphragm with RIGHT basilar atelectasis. Mild central peribronchial thickening. Lungs otherwise clear. No acute infiltrate, pleural effusion or pneumothorax. Increased attenuation of the abdomen with central displacement of bowel loops question ascites. Air-filled small bowel loops in the mid abdomen are nonspecific. Scattered gas in transverse colon and rectum. No definite bowel wall thickening or free air. Mild degenerative changes of the lumbar spine. No urine tract calcification. IMPRESSION: RIGHT basilar atelectasis. Probable ascites. Few air-filled minimally prominent  loops of small bowel in the mid abdomen with some colonic gas present. Pattern is nonspecific, potentially ileus, obstruction considered less likely. Electronically Signed   By: Lavonia Dana M.D.   On: 01/04/2017 16:45   US Abdomen Limited Ruq  Result Date: 01/05/2017 CLINICAL DATA:  Abdominal distention with elevated liver enzymes EXAM: ULTRASOUND ABDOMEN LIMITED RIGHT UPPER QUADRANT COMPARISON:  None. FINDINGS: Gallbladder: Sludge is noted within the gallbladder. There are no echogenic foci which move and shadow as is expected with gallstones. The gallbladder wall is thickened without obvious pericholecystic fluid. No sonographic Murphy sign noted by sonographer. Common bile duct: Diameter: 6 mm. No intrahepatic or extrahepatic biliary duct dilatation. Liver: No focal lesion identified. Liver echogenicity is increased diffusely. The liver is small with a nodular contour consistent with underlying cirrhosis. Portal vein is patent on color Doppler imaging with normal direction of blood flow towards the liver. There is fairly extensive ascites. IMPRESSION: 1. There is thickening of the gallbladder wall with sludge present. No gallstones are evident. No pericholecystic fluid is appreciable. There is ascites which may cause gallbladder wall thickening. A degree of acalculus cholecystitis is difficult to exclude in this circumstance, however. This finding may warrant nuclear medicine hepatobiliary imaging study to assess for cystic duct patency given this circumstance. 2. Liver is small with nodular contour increased echogenicity consistent with hepatic cirrhosis. While no focal liver lesions are evident on this study, it must be cautioned that the sensitivity of ultrasound for detection of focal liver lesions is diminished significantly in this circumstance. Electronically Signed   By: Lowella Grip III M.D.   On: 01/05/2017 14:52    Impression/Plan: Newly diagnosed chronic Hep C cirrhosis with  decompensation (anasarca) - repeat paracentesis with large volume (max of 6 more liters) and give IV albumin with it. Low sodium diet. Check AFP. Will need diuretics at discharge (Lasix 40 mg/day and Spironolactone 100 mg/day) with close following of serum Cr. Supportive care. At discharge will ask one of my partners that treats Hep C to see him in f/u because he is interested in Hep C treatment. May be ok to go home in tomorrow or Saturday if remains stable.    LOS: 1 day   [place] C.  01/06/2017, 12:24 PM  Pager [phone]  AFTER 5 pm or on weekends please call [phone]

## 2017-01-07 DIAGNOSIS — B182 Chronic viral hepatitis C: Principal | ICD-10-CM

## 2017-01-07 LAB — CBC
HCT: 33.8 % — ABNORMAL LOW (ref 39.0–52.0)
HEMOGLOBIN: 11.4 g/dL — AB (ref 13.0–17.0)
MCH: 33.5 pg (ref 26.0–34.0)
MCHC: 33.7 g/dL (ref 30.0–36.0)
MCV: 99.4 fL (ref 78.0–100.0)
PLATELETS: 104 10*3/uL — AB (ref 150–400)
RBC: 3.4 MIL/uL — AB (ref 4.22–5.81)
RDW: 14.2 % (ref 11.5–15.5)
WBC: 4.9 10*3/uL (ref 4.0–10.5)

## 2017-01-07 LAB — COMPREHENSIVE METABOLIC PANEL
ALBUMIN: 3 g/dL — AB (ref 3.5–5.0)
ALK PHOS: 50 U/L (ref 38–126)
ALT: 13 U/L — ABNORMAL LOW (ref 17–63)
ANION GAP: 5 (ref 5–15)
AST: 34 U/L (ref 15–41)
BUN: 5 mg/dL — ABNORMAL LOW (ref 6–20)
CALCIUM: 8.2 mg/dL — AB (ref 8.9–10.3)
CHLORIDE: 105 mmol/L (ref 101–111)
CO2: 27 mmol/L (ref 22–32)
Creatinine, Ser: 0.82 mg/dL (ref 0.61–1.24)
GFR calc Af Amer: >60 mL/min (ref >60)
GFR calc non Af Amer: >60 mL/min (ref >60)
GLUCOSE: 124 mg/dL — AB (ref 65–99)
Potassium: 3 mmol/L — ABNORMAL LOW (ref 3.5–5.1)
SODIUM: 137 mmol/L (ref 135–145)
Total Bilirubin: 1.9 mg/dL — ABNORMAL HIGH (ref 0.3–1.2)
Total Protein: 6.5 g/dL (ref 6.5–8.1)

## 2017-01-07 LAB — GLUCOSE, CAPILLARY
Glucose-Capillary: 102 mg/dL — ABNORMAL HIGH (ref 65–99)
Glucose-Capillary: 96 mg/dL (ref 65–99)

## 2017-01-07 LAB — AFP TUMOR MARKER: AFP, SERUM, TUMOR MARKER: 7 ng/mL (ref 0.0–8.3)

## 2017-01-07 LAB — POTASSIUM: POTASSIUM: 3.5 mmol/L (ref 3.5–5.1)

## 2017-01-07 MED ORDER — PANTOPRAZOLE SODIUM 40 MG PO TBEC: 40.0000 mg | DELAYED_RELEASE_TABLET | Freq: Every day | ORAL | Status: DC

## 2017-01-07 MED ORDER — FUROSEMIDE 40 MG PO TABS: 40.0000 mg | ORAL_TABLET | Freq: Every day | ORAL | 0 refills | Status: DC

## 2017-01-07 MED ORDER — POTASSIUM CHLORIDE 20 MEQ PO PACK: 20.0000 meq | PACK | Freq: Every day | ORAL | 0 refills | Status: DC

## 2017-01-07 MED ORDER — POTASSIUM CHLORIDE 10 MEQ/100ML IV SOLN
10.0000 meq | INTRAVENOUS | Status: AC
  Administered 2017-01-07 (×3): 10 meq via INTRAVENOUS
  Filled 2017-01-07 (×3): qty 100

## 2017-01-07 MED ORDER — PANTOPRAZOLE SODIUM 40 MG PO TBEC: 40.0000 mg | DELAYED_RELEASE_TABLET | Freq: Every day | ORAL | 0 refills | Status: DC

## 2017-01-07 MED ORDER — POTASSIUM CHLORIDE CRYS ER 20 MEQ PO TBCR
60.0000 meq | EXTENDED_RELEASE_TABLET | Freq: Once | ORAL | Status: AC
  Administered 2017-01-07: 60 meq via ORAL
  Filled 2017-01-07: qty 3

## 2017-01-07 MED ORDER — SPIRONOLACTONE 100 MG PO TABS: 100.0000 mg | ORAL_TABLET | Freq: Every day | ORAL | 0 refills | Status: DC

## 2017-01-07 NOTE — Progress Notes (Signed)
[facility] Gastroenterology Progress Note  Jerry Mcintyre 61 y.o. Aug 27, 1955   Subjective: Feels "swollen." Denies abdominal pain. Tolerating diet.  Objective: Vital signs: Vitals:   01/06/17 2035 01/07/17 0542  BP: 116/66 128/72  Pulse: 71 73  Resp: 18 18  Temp: 98.4 F (36.9 C) 98.5 F (36.9 C)  SpO2: 96% 97%    Physical Exam: Gen: alert, no acute distress  HEENT: anicteric sclera CV: RRR Chest: CTA B Abd: distended, nontender, +BS Ext: 1+ pitting LE edema  Lab Results: Recent Labs    01/05/17 0446 01/06/17 0540 01/07/17 0535  NA 135 137 137  K 4.1 3.6 3.0*  CL 104 104 105  CO2 25 28 27   GLUCOSE 105* 124* 124*  BUN 6 <5* 5*  CREATININE 0.73 0.78 0.82  CALCIUM 7.8* 8.2* 8.2*  MG 1.7  --   --    Recent Labs    01/06/17 0540 01/07/17 0535  AST 40 34  ALT 15* 13*  ALKPHOS 59 50  BILITOT 3.4* 1.9*  PROT 7.1 6.5  ALBUMIN 3.1* 3.0*   Recent Labs    01/04/17 1637  01/06/17 0540 01/07/17 0535  WBC 3.5*   < > 6.4 4.9  NEUTROABS 1.7  --   --   --   HGB 12.8*   < > 12.1* 11.4*  HCT 37.4*   < > 36.2* 33.8*  MCV 100.0   < > 99.7 99.4  PLT 129*   < > 108* 104*   < > = values in this interval not displayed.      Assessment/Plan: Decompensated Hep C cirrhosis - s/p 6 L removed on paracentesis yesterday (total of 12 L since admit). IV Albumin given yesterday. Cr stable. Ok to go home today on diuretics (see consult note for dose/type recs). Will see if one of my partners willing to treat his Hep C as an outpt. Tried to reassure him. Stressed strict low sodium diet (less than 2 g/day). Avoid all alcohol. F/U with me in 3-4 weeks (will change to my partner if they are willing to treat his Hep C. Will sign off. Call if questions.   [place] C. 01/07/2017, 10:41 AM  Pager [phone]  AFTER 5 PM or on weekends please call [phone] ID: Jerry Mcintyre, male   DOB: Sep 19, 1955, 61 y.o.   MRN: [id number]

## 2017-01-07 NOTE — Discharge Summary (Signed)
Physician Discharge Summary  Jerry Mcintyre:[id number] DOB: 1955/06/03 DOA: 01/04/2017  PCP: Patient, No Pcp Per  Admit date: 01/04/2017 Discharge date: 01/07/2017  Admitted From:home Disposition:home  Recommendations for Outpatient Follow-up:  1. Follow up with PCP in 1-2 weeks 2. Please obtain CMP/CBC in one week with your PCP or GI. 3. Please take a low-salt diet.  Take your medication and check blood pressure daily.  If you feel dizzy or lightheadedness please contact your doctor or come to ER. 4. Please follow-up with GI for the treatment of hepatitis C and ascites.     Home Health:no Equipment/Devices:none Discharge Condition:stable CODE STATUS:full code Diet recommendation:low salt diet  Brief/Interim Summary: 61 year old male with no significant past medical history presented with worsening abdominal distention, abdominal pain, anasarca for about 6 weeks.  Patient was found to have elevated liver enzymes, coagulopathy and ascites.  Admitted for further evaluation.  #Ascites, liver cirrhosis likely contributed by chronic hepatitis C, untreated: Patient reported that in the remote past he was told that he has hepatitis but he was not sure about it.  Now with worsening abdominal distention and edema. -Status post ultrasound-guided paracentesis twice with removal of total 12 L.  Abdomen pain and distention better.  No sign of infection.  Evaluated by GI and I have discussed with Dr. Michail Sermon today.  Starting oral Lasix and Aldactone per GI recommendation.  Patient will follow-up with GI outpatient.  Also recommended to follow-up with GI for the treatment of hepatitis C.  I have reviewed this with the patient at bedside and he verbalized understanding.  Also reinforced low-salt diet, daily weight, daily blood pressure monitoring.   #Newly diagnosed hepatitis C: Follow-up with GI outpatient for the treatment.  #Coagulopathy, thrombocytopenia, transaminase close in the setting of  liver cirrhosis, hepatitis C.  Received a dose of vitamin K in the hospital.  No sign of bleeding.  Recommended to monitor labs outpatient.  #Hemoptysis on admission resolved now.  #Hypokalemia received IV and oral potassium chloride today.  Encourage oral intake.  Discharging with oral potassium chloride.  Recommend to monitor lab outpatient.  Discussed with GI.  Patient feels better.  Discharging with medication and recommended outpatient follow-up.  I think patient's care can be transferred to outpatient this time.  Discharge Diagnoses:  Principal Problem:   Ascites Active Problems:   Thrombocytopenia (HCC)   Hemoptysis   Abdominal pain   Abdominal distention   Hypokalemia   Abnormal liver function   Anasarca    Discharge Instructions  Discharge Instructions    Call MD for:  difficulty breathing, headache or visual disturbances   Complete by:  As directed    Call MD for:  extreme fatigue   Complete by:  As directed    Call MD for:  hives   Complete by:  As directed    Call MD for:  persistant dizziness or light-headedness   Complete by:  As directed    Call MD for:  persistant nausea and vomiting   Complete by:  As directed    Call MD for:  severe uncontrolled pain   Complete by:  As directed    Call MD for:  temperature >100.4   Complete by:  As directed    Diet - low sodium heart healthy   Complete by:  As directed    Discharge instructions   Complete by:  As directed    Please check BP daily at home. Please check lab (CBC, CMP) within a week with  your PCP or GI. Please call GI clinic to set up follow up appointment.   Increase activity slowly   Complete by:  As directed      Allergies as of 01/07/2017      Reactions   Bee Venom       Medication List    TAKE these medications   furosemide 40 MG tablet Commonly known as:  LASIX Take 1 tablet (40 mg total) by mouth daily. Start taking on:  01/08/2017   pantoprazole 40 MG tablet Commonly known as:   PROTONIX Take 1 tablet (40 mg total) by mouth at bedtime.   potassium chloride 20 MEQ packet Commonly known as:  KLOR-CON Take 20 mEq by mouth daily.   spironolactone 100 MG tablet Commonly known as:  ALDACTONE Take 1 tablet (100 mg total) by mouth daily.      Follow-up Information    Wilford Corner, MD. Schedule an appointment as soon as possible for a visit in 3 week(s).   Specialty:  Gastroenterology Contact information: [address] [place] [address] [phone]        [place]. Schedule an appointment as soon as possible for a visit in 1 week(s).   Contact information: [place] [address] [phone]         Allergies  Allergen Reactions  . Bee Venom     Consultations: GI  Procedures/Studies: None  Subjective: Seen and examined at bedside.  Denied nausea vomiting chest pain shortness of breath, abdominal pain.  Oral intake is not great.  Discharge Exam: Vitals:   01/07/17 0542 01/07/17 1049  BP: 128/72 128/74  Pulse: 73 68  Resp: 18 18  Temp: 98.5 F (36.9 C)   SpO2: 97% 97%   Vitals:   01/06/17 1524 01/06/17 2035 01/07/17 0542 01/07/17 1049  BP: 119/84 116/66 128/72 128/74  Pulse:  71 73 68  Resp:  18 18 18   Temp:  98.4 F (36.9 C) 98.5 F (36.9 C)   TempSrc:  Oral Oral   SpO2:  96% 97% 97%  Weight:   89.7 kg (197 lb 12 oz)   Height:        General: Pt is alert, awake, not in acute distress Cardiovascular: RRR, S1/S2 +, no rubs, no gallops Respiratory: CTA bilaterally, no wheezing, no rhonchi Abdominal: Soft, distended but much better than 2 days ago.  Bowel sounds positive. Extremities: no edema, no cyanosis    The results of significant diagnostics from this hospitalization (including imaging, microbiology, ancillary and laboratory) are listed below for reference.     Microbiology: Recent Results (from the past 240 hour(s))  Culture,  body fluid-bottle     Status: None (Preliminary result)   Collection Time: 01/05/17  2:55 PM  Result Value Ref Range Status   Specimen Description FLUID PERITONEAL  Final   Special Requests BOTTLES DRAWN AEROBIC AND ANAEROBIC  Final   Culture   Final    NO GROWTH 2 DAYS Performed at [facility], [address] [place], [place] [address]    Report Status PENDING  Incomplete  Gram stain     Status: None   Collection Time: 01/05/17  2:55 PM  Result Value Ref Range Status   Specimen Description FLUID PERITONEAL  Final   Special Requests NONE  Final   Gram Stain   Final    WBC PRESENT, PREDOMINANTLY MONONUCLEAR NO ORGANISMS SEEN CYTOSPIN SMEAR Performed at [facility], [place] [address], [place], [place] [address]    Report Status 01/06/2017 FINAL  Final     Labs: BNP (last 3 results) Recent Labs    01/05/17 0035  BNP 664.4*   Basic Metabolic Panel: Recent Labs  Lab 01/04/17 1637 01/05/17 0446 01/06/17 0540 01/07/17 0535 01/07/17 1212  NA 136 135 137 137  --   K 3.0* 4.1 3.6 3.0* 3.5  CL 103 104 104 105  --   CO2 27 25 28 27   --   GLUCOSE 120* 105* 124* 124*  --   BUN 6 6 <5* 5*  --   CREATININE 1.01 0.73 0.78 0.82  --   CALCIUM 8.1* 7.8* 8.2* 8.2*  --   MG  --  1.7  --   --   --    Liver Function Tests: Recent Labs  Lab 01/04/17 1637 01/05/17 0446 01/06/17 0540 01/07/17 0535  AST 60* 59* 40 34  ALT 21 20 15* 13*  ALKPHOS 77 79 59 50  BILITOT 2.5* 1.8* 3.4* 1.9*  PROT 8.2* 7.4 7.1 6.5  ALBUMIN 2.6* 2.3* 3.1* 3.0*   Recent Labs  Lab 01/04/17 1637  LIPASE 28   Recent Labs  Lab 01/05/17 0037  AMMONIA 40*   CBC: Recent Labs  Lab 01/04/17 1637 01/05/17 0446 01/06/17 0540 01/07/17 0535  WBC 3.5* 4.9 6.4 4.9  NEUTROABS 1.7  --   --   --   HGB 12.8* 12.0* 12.1* 11.4*  HCT 37.4* 34.2* 36.2* 33.8*  MCV 100.0 98.6 99.7 99.4  PLT 129* 117* 108* 104*   Cardiac Enzymes: No results for input(s): CKTOTAL, CKMB, CKMBINDEX, TROPONINI in the  last 168 hours. BNP: Invalid input(s): POCBNP CBG: Recent Labs  Lab 01/06/17 0749 01/07/17 0737 01/07/17 1131  GLUCAP 126* 102* 96   D-Dimer No results for input(s): DDIMER in the last 72 hours. Hgb A1c No results for input(s): HGBA1C in the last 72 hours. Lipid Profile No results for input(s): CHOL, HDL, LDLCALC, TRIG, CHOLHDL, LDLDIRECT in the last 72 hours. Thyroid function studies No results for input(s): TSH, T4TOTAL, T3FREE, THYROIDAB in the last 72 hours.  Invalid input(s): FREET3 Anemia work up No results for input(s): VITAMINB12, FOLATE, FERRITIN, TIBC, IRON, RETICCTPCT in the last 72 hours. Urinalysis    Component Value Date/Time   COLORURINE AMBER (A) 01/05/2017 0631   APPEARANCEUR CLEAR 01/05/2017 0631   LABSPEC 1.028 01/05/2017 0631   PHURINE 5.0 01/05/2017 0631   GLUCOSEU NEGATIVE 01/05/2017 0631   HGBUR NEGATIVE 01/05/2017 0631   BILIRUBINUR SMALL (A) 01/05/2017 0631   KETONESUR NEGATIVE 01/05/2017 0631   PROTEINUR 30 (A) 01/05/2017 0631   NITRITE NEGATIVE 01/05/2017 0631   LEUKOCYTESUR NEGATIVE 01/05/2017 0631   Sepsis Labs Invalid input(s): PROCALCITONIN,  WBC,  LACTICIDVEN Microbiology Recent Results (from the past 240 hour(s))  Culture, body fluid-bottle     Status: None (Preliminary result)   Collection Time: 01/05/17  2:55 PM  Result Value Ref Range Status   Specimen Description FLUID PERITONEAL  Final   Special Requests BOTTLES DRAWN AEROBIC AND ANAEROBIC  Final   Culture   Final    NO GROWTH 2 DAYS Performed at [facility], [place] [address], [place], [place] [address]    Report Status PENDING  Incomplete  Gram stain     Status: None   Collection Time: 01/05/17  2:55 PM  Result Value Ref Range Status   Specimen Description FLUID PERITONEAL  Final   Special  Requests NONE  Final   Gram Stain   Final    WBC PRESENT, PREDOMINANTLY MONONUCLEAR NO ORGANISMS SEEN CYTOSPIN SMEAR Performed at [facility], [place] [address]  [place], [place] [address]    Report Status 01/06/2017 FINAL  Final     Time coordinating discharge: 33 minutes  SIGNED:   Rosita Fire, MD  [facility] 01/07/2017, 1:20 PM  If 7PM-7AM, please contact night-coverage [email or web address] Password [email or web address]

## 2017-01-09 LAB — TOTAL BILIRUBIN, BODY FLUID: Total bilirubin, fluid: 0.5 mg/dL

## 2017-01-10 LAB — CULTURE, BODY FLUID W GRAM STAIN -BOTTLE

## 2017-01-10 LAB — CULTURE, BODY FLUID-BOTTLE: CULTURE: NO GROWTH

## 2017-03-31 ENCOUNTER — Inpatient Hospital Stay (HOSPITAL_COMMUNITY)
Admission: EM | Admit: 2017-03-31 | Discharge: 2017-04-03 | DRG: 442 | Disposition: A | Discharge status: Home / Self Care | Payer: Medicare Other | Attending: Internal Medicine | Admitting: Internal Medicine

## 2017-03-31 ENCOUNTER — Other Ambulatory Visit: Payer: Self-pay

## 2017-03-31 ENCOUNTER — Encounter (HOSPITAL_COMMUNITY): Payer: Self-pay | Admitting: Emergency Medicine

## 2017-03-31 ENCOUNTER — Emergency Department (HOSPITAL_COMMUNITY): Payer: Medicare Other

## 2017-03-31 DIAGNOSIS — Z91030 Bee allergy status: Secondary | ICD-10-CM

## 2017-03-31 DIAGNOSIS — K7460 Unspecified cirrhosis of liver: Secondary | ICD-10-CM | POA: Diagnosis present

## 2017-03-31 DIAGNOSIS — Z87891 Personal history of nicotine dependence: Secondary | ICD-10-CM | POA: Diagnosis not present

## 2017-03-31 DIAGNOSIS — E8770 Fluid overload, unspecified: Secondary | ICD-10-CM | POA: Diagnosis present

## 2017-03-31 DIAGNOSIS — Z9119 Patient's noncompliance with other medical treatment and regimen: Secondary | ICD-10-CM | POA: Diagnosis not present

## 2017-03-31 DIAGNOSIS — I10 Essential (primary) hypertension: Secondary | ICD-10-CM | POA: Diagnosis present

## 2017-03-31 DIAGNOSIS — R0600 Dyspnea, unspecified: Secondary | ICD-10-CM

## 2017-03-31 DIAGNOSIS — R791 Abnormal coagulation profile: Secondary | ICD-10-CM | POA: Diagnosis present

## 2017-03-31 DIAGNOSIS — Z79899 Other long term (current) drug therapy: Secondary | ICD-10-CM

## 2017-03-31 DIAGNOSIS — B182 Chronic viral hepatitis C: Secondary | ICD-10-CM | POA: Diagnosis present

## 2017-03-31 DIAGNOSIS — D539 Nutritional anemia, unspecified: Secondary | ICD-10-CM | POA: Diagnosis present

## 2017-03-31 DIAGNOSIS — R188 Other ascites: Secondary | ICD-10-CM | POA: Diagnosis present

## 2017-03-31 HISTORY — DX: Essential (primary) hypertension: I10

## 2017-03-31 LAB — CBC WITH DIFFERENTIAL/PLATELET
BASOS ABS: 0.1 10*3/uL (ref 0.0–0.1)
BASOS PCT: 1 %
EOS ABS: 0.2 10*3/uL (ref 0.0–0.7)
EOS PCT: 4 %
HCT: 35.8 % — ABNORMAL LOW (ref 39.0–52.0)
Hemoglobin: 11.9 g/dL — ABNORMAL LOW (ref 13.0–17.0)
LYMPHS PCT: 31 %
Lymphs Abs: 1.3 10*3/uL (ref 0.7–4.0)
MCH: 34 pg (ref 26.0–34.0)
MCHC: 33.2 g/dL (ref 30.0–36.0)
MCV: 102.3 fL — AB (ref 78.0–100.0)
Monocytes Absolute: 0.3 10*3/uL (ref 0.1–1.0)
Monocytes Relative: 7 %
Neutro Abs: 2.3 10*3/uL (ref 1.7–7.7)
Neutrophils Relative %: 57 %
PLATELETS: 157 10*3/uL (ref 150–400)
RBC: 3.5 MIL/uL — AB (ref 4.22–5.81)
RDW: 14.7 % (ref 11.5–15.5)
WBC: 4.1 10*3/uL (ref 4.0–10.5)

## 2017-03-31 LAB — COMPREHENSIVE METABOLIC PANEL
ALBUMIN: 2.5 g/dL — AB (ref 3.5–5.0)
ALT: 21 U/L (ref 17–63)
AST: 60 U/L — AB (ref 15–41)
Alkaline Phosphatase: 87 U/L (ref 38–126)
Anion gap: 6 (ref 5–15)
BUN: 12 mg/dL (ref 6–20)
CHLORIDE: 102 mmol/L (ref 101–111)
CO2: 26 mmol/L (ref 22–32)
CREATININE: 1.04 mg/dL (ref 0.61–1.24)
Calcium: 8.5 mg/dL — ABNORMAL LOW (ref 8.9–10.3)
GFR calc Af Amer: >60 mL/min (ref >60)
GFR calc non Af Amer: >60 mL/min (ref >60)
GLUCOSE: 154 mg/dL — AB (ref 65–99)
Potassium: 4.2 mmol/L (ref 3.5–5.1)
SODIUM: 134 mmol/L — AB (ref 135–145)
Total Bilirubin: 2.8 mg/dL — ABNORMAL HIGH (ref 0.3–1.2)
Total Protein: 8.9 g/dL — ABNORMAL HIGH (ref 6.5–8.1)

## 2017-03-31 LAB — PROTIME-INR
INR: 1.33
Prothrombin Time: 16.3 seconds — ABNORMAL HIGH (ref 11.4–15.2)

## 2017-03-31 LAB — LIPASE, BLOOD: Lipase: 41 U/L (ref 11–51)

## 2017-03-31 MED ORDER — FUROSEMIDE 10 MG/ML IJ SOLN
40.0000 mg | Freq: Once | INTRAMUSCULAR | Status: AC
  Administered 2017-03-31: 40 mg via INTRAVENOUS
  Filled 2017-03-31: qty 4

## 2017-03-31 MED ORDER — FUROSEMIDE 10 MG/ML IJ SOLN
40.0000 mg | Freq: Two times a day (BID) | INTRAMUSCULAR | Status: DC
Start: 2017-04-01 — End: 2017-03-31

## 2017-03-31 MED ORDER — PANTOPRAZOLE SODIUM 40 MG PO TBEC
40.0000 mg | DELAYED_RELEASE_TABLET | Freq: Every day | ORAL | Status: DC
  Administered 2017-03-31 – 2017-04-02 (×3): 40 mg via ORAL
  Filled 2017-03-31 (×3): qty 1

## 2017-03-31 MED ORDER — FUROSEMIDE 10 MG/ML IJ SOLN: 40.0000 mg | Freq: Two times a day (BID) | INTRAMUSCULAR | Status: DC

## 2017-03-31 MED ORDER — SPIRONOLACTONE 25 MG PO TABS
100.0000 mg | ORAL_TABLET | Freq: Every day | ORAL | Status: DC
  Administered 2017-03-31 – 2017-04-02 (×3): 100 mg via ORAL
  Filled 2017-03-31: qty 4
  Filled 2017-03-31: qty 1
  Filled 2017-03-31: qty 4

## 2017-03-31 MED ORDER — OXYCODONE HCL 5 MG PO TABS
5.0000 mg | ORAL_TABLET | ORAL | Status: DC | PRN
  Administered 2017-04-01 – 2017-04-02 (×4): 5 mg via ORAL
  Filled 2017-03-31 (×5): qty 1

## 2017-03-31 MED ORDER — ONDANSETRON HCL 4 MG PO TABS
4.0000 mg | ORAL_TABLET | Freq: Four times a day (QID) | ORAL | Status: DC | PRN
  Administered 2017-04-03: 4 mg via ORAL
  Filled 2017-03-31: qty 1

## 2017-03-31 MED ORDER — ENOXAPARIN SODIUM 40 MG/0.4ML ~~LOC~~ SOLN: 40.0000 mg | SUBCUTANEOUS | Status: DC

## 2017-03-31 MED ORDER — ONDANSETRON HCL 4 MG/2ML IJ SOLN: 4.0000 mg | Freq: Four times a day (QID) | INTRAMUSCULAR | Status: DC | PRN

## 2017-03-31 MED ORDER — TRAMADOL HCL 50 MG PO TABS
50.0000 mg | ORAL_TABLET | Freq: Two times a day (BID) | ORAL | Status: DC | PRN
  Administered 2017-03-31 – 2017-04-03 (×3): 50 mg via ORAL
  Filled 2017-03-31 (×3): qty 1

## 2017-03-31 NOTE — Progress Notes (Signed)
CCMD made this RN aware pt. Was in new Afib. EKG done. Pt. C/o of pain and SOB d/t severe ascites. Tramadol 50 mg given. On call Bodenheimer, MD made aware. Will continue to monitor pt. Closely   Drucilla Chalet, RN

## 2017-03-31 NOTE — ED Notes (Signed)
ED TO INPATIENT HANDOFF REPORT  Name/Age/Gender Jerry Mcintyre 62 y.o. male  Code Status Code Status History    Date Active Date Inactive Code Status Order ID Comments User Context   01/05/2017 00:49 01/07/2017 19:47 Full Code [id number]  Ivor Costa, MD ED      Home/SNF/Other Home  Chief Complaint chest pain / SOB   Level of Care/Admitting Diagnosis ED Disposition    ED Disposition Condition [facility] Area: [facility] [564332]  Level of Care: Telemetry [5]  Admit to tele based on following criteria: Other see comments  Comments: SOB  Diagnosis: Fluid overload [951884]  Admitting Physician: Bethena Roys [[id number]]  Attending Physician: Bethena Roys [phone]  Estimated length of stay: past midnight tomorrow  Certification:: I certify this patient will need inpatient services for at least 2 midnights  PT Class (Do Not Modify): Inpatient [101]  PT Acc Code (Do Not Modify): Private [1]       Medical History Past Medical History:  Diagnosis Date  . Hernia, abdominal   . Hypertension   . 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 Bee Venom     IV Location/Drains/Wounds Patient Lines/Drains/Airways Status   Active Line/Drains/Airways    Name:   Placement date:   Placement time:   Site:   Days:   Peripheral IV 03/31/17 Antecubital   03/31/17    1751    Antecubital   less than 1          Labs/Imaging Results for orders placed or performed during the hospital encounter of 03/31/17 (from the past 48 hour(s))  CBC with Differential/Platelet     Status: Abnormal   Collection Time: 03/31/17  5:14 PM  Result Value Ref Range   WBC 4.1 4.0 - 10.5 K/uL   RBC 3.50 (L) 4.22 - 5.81 MIL/uL   Hemoglobin 11.9 (L) 13.0 - 17.0 g/dL   HCT 35.8 (L) 39.0 - 52.0 %   MCV 102.3 (H) 78.0 - 100.0 fL   MCH 34.0 26.0 - 34.0 pg   MCHC 33.2 30.0 - 36.0 g/dL   RDW 14.7 11.5 - 15.5 %   Platelets 157 150 - 400  K/uL   Neutrophils Relative % 57 %   Neutro Abs 2.3 1.7 - 7.7 K/uL   Lymphocytes Relative 31 %   Lymphs Abs 1.3 0.7 - 4.0 K/uL   Monocytes Relative 7 %   Monocytes Absolute 0.3 0.1 - 1.0 K/uL   Eosinophils Relative 4 %   Eosinophils Absolute 0.2 0.0 - 0.7 K/uL   Basophils Relative 1 %   Basophils Absolute 0.1 0.0 - 0.1 K/uL    Comment: Performed at [facility], [place] [address] [place], [place] [address]  Comprehensive metabolic panel     Status: Abnormal   Collection Time: 03/31/17  5:14 PM  Result Value Ref Range   Sodium 134 (L) 135 - 145 mmol/L   Potassium 4.2 3.5 - 5.1 mmol/L   Chloride 102 101 - 111 mmol/L   CO2 26 22 - 32 mmol/L   Glucose, Bld 154 (H) 65 - 99 mg/dL   BUN 12 6 - 20 mg/dL   Creatinine, Ser 1.04 0.61 - 1.24 mg/dL   Calcium 8.5 (L) 8.9 - 10.3 mg/dL   Total Protein 8.9 (H) 6.5 - 8.1 g/dL   Albumin 2.5 (L) 3.5 - 5.0 g/dL   AST 60 (H) 15 - 41  U/L   ALT 21 17 - 63 U/L   Alkaline Phosphatase 87 38 - 126 U/L   Total Bilirubin 2.8 (H) 0.3 - 1.2 mg/dL   GFR calc non Af Amer >60 >60 mL/min   GFR calc Af Amer >60 >60 mL/min    Comment: (NOTE) The eGFR has been calculated using the CKD EPI equation. This calculation has not been validated in all clinical situations. eGFR's persistently <60 mL/min signify possible Chronic Kidney Disease.    Anion gap 6 5 - 15    Comment: Performed at [facility], [place] [address] [place], [place] [address]  Lipase, blood     Status: None   Collection Time: 03/31/17  5:14 PM  Result Value Ref Range   Lipase 41 11 - 51 U/L    Comment: Performed at [facility], [place] [address] [place], [place] [address]  Protime-INR     Status: Abnormal   Collection Time: 03/31/17  7:50 PM  Result Value Ref Range   Prothrombin Time 16.3 (H) 11.4 - 15.2 seconds   INR 1.33     Comment: Performed at [facility], [place] [address], [place],  [address]   Dg Chest 2  View  Result Date: 03/31/2017 CLINICAL DATA:  Short of breath EXAM: CHEST  2 VIEW COMPARISON:  01/04/2017 FINDINGS: Low lung volumes with elevation of the right diaphragm. Atelectasis in the right mid lung and right base. No focal consolidation or effusion. Cardiomediastinal silhouette within normal limits allowing for low lung volumes. Aortic atherosclerosis. No pneumothorax. IMPRESSION: Low lung volumes with right basilar atelectasis. Electronically Signed   By: Donavan Foil M.D.   On: 03/31/2017 17:45    Pending Labs [employer] (From admission, onward)   Start     Ordered   Signed and Held  Basic metabolic panel  Tomorrow morning,   R     Signed and Held      Vitals/Pain Today's Vitals   03/31/17 1623 03/31/17 1840 03/31/17 2024  BP: (!) 159/102 124/89 (!) 148/100  Pulse:  92 87  Resp: (!) 24 18 15   Temp: 98.6 F (37 C)    TempSrc: Oral    SpO2: 92% 98% 97%    Isolation Precautions No active isolations  Medications Medications  furosemide (LASIX) injection 40 mg (not administered)  spironolactone (ALDACTONE) tablet 100 mg (100 mg Oral Given 03/31/17 2023)  furosemide (LASIX) injection 40 mg (40 mg Intravenous Given 03/31/17 1857)    Mobility  Walks

## 2017-03-31 NOTE — ED Triage Notes (Signed)
Patient reports he was admitted in December for ascites and had a paracentesis. Reports worsening swelling to entire body since beginning of February. Patient c/o SOB r/t swelling in abdomen. Hx hepatitis.

## 2017-03-31 NOTE — H&P (Addendum)
History and Physical    Jerry Mcintyre QIH:[id number] DOB: April 09, 1955 DOA: 03/31/2017  PCP: Patient, No Pcp Per   Patient coming from: Home   Chief Complaint:   HPI: Jerry Mcintyre is a 62 y.o. male with medical history significant for liver cirrhosis chronic hep C, tented to the ED with complaints of abdominal distention leg swelling scrotal swelling shortness of breath of 4- 6 weeks duration.  Patient was admitted 01/04/17- 01/07/17, with similar symptoms, with new diagnoses of cirrhosis.  He had 2 paracentesis on admission, and total of 12 L of ascitic fluid drawn. He reported being told several years in the past that he had hepatitis C but did not follow-up.  Patient was sent home on 1 months prescription of Lasix, spironolactone which was compliant with but he did not follow up with GI as arranged prior to discharge, and so he could not get refills. He reports he missed his appointment because he was in bad shape, it was cold and snowing.  His sister says she encouraged patient severally to make this appointment, but he refused.   Pt denies fever chills nausea vomiting no abdominal pain just feels his abdomen is tense.  ED Course: Temp 99 otherwise stable vitals O2 sats greater than 92% on room air.  BMP CBC stable, AST mildly 60.  Chest x-ray right basilar atelectasis.  Patient was given IV Lasix x1 was called to admit for fluid overload.  Review of Systems: As per HPI otherwise 10 point review of systems negative.  Past Medical History:  Diagnosis Date  . Hernia, abdominal   . Hypertension   . 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 DENTAL SURGERY    . teeth removal       reports that he quit smoking about 4 months ago. he has never used smokeless tobacco. He reports that he drinks alcohol. He reports that he does not use drugs.  Allergies  Allergen Reactions  . Bee Venom     Family History  Problem Relation Age of Onset  .  Stroke Mother   . Hypertension Mother     Prior to Admission medications   Medication Sig Start Date End Date Taking? Authorizing Provider  furosemide (LASIX) 40 MG tablet Take 1 tablet (40 mg total) by mouth daily. 01/08/17   Rosita Fire, MD  pantoprazole (PROTONIX) 40 MG tablet Take 1 tablet (40 mg total) by mouth at bedtime. 01/07/17   Rosita Fire, MD  potassium chloride (KLOR-CON) 20 MEQ packet Take 20 mEq by mouth daily. 01/07/17   Rosita Fire, MD  spironolactone (ALDACTONE) 100 MG tablet Take 1 tablet (100 mg total) by mouth daily. 01/07/17   Rosita Fire, MD    Physical Exam: Vitals:   03/31/17 1623 03/31/17 1840  BP: (!) 159/102 124/89  Pulse:  92  Resp: (!) 24 18  Temp: 98.6 F (37 C)   TempSrc: Oral   SpO2: 92% 98%    Constitutional: NAD, calm, comfortable, mild dyspnea with long sentences, chronically ill-appearing Vitals:   03/31/17 1623 03/31/17 1840  BP: (!) 159/102 124/89  Pulse:  92  Resp: (!) 24 18  Temp: 98.6 F (37 C)   TempSrc: Oral   SpO2: 92% 98%   Eyes: PERRL, lids and conjunctivae normal ENMT: Mucous membranes are moist. Posterior pharynx clear of any exudate or lesions.Normal dentition.  Neck: normal, supple, no masses, no thyromegaly Respiratory: clear  to auscultation bilaterally, no wheezing, no crackles.  No accessory muscle use.  Cardiovascular: Regular rate and rhythm, no murmurs / rubs / gallops. No extremity edema. 3+ pitting pedal edema. Abdomen: Markedly distended abdomen, tense ascites.  Scrotal area appears swollen through dressing gown. Musculoskeletal: no clubbing / cyanosis. No joint deformity upper and lower extremities. Good ROM, no contractures. Normal muscle tone.  Skin: no rashes, lesions, ulcers. No induration Neurologic: CN 2-12 grossly intact. Strength 5/5 in all 4.  Psychiatric: Normal judgment and insight. Alert and oriented x 3. Normal mood.   Labs on Admission: I have personally  reviewed following labs and imaging studies  CBC: Recent Labs  Lab 03/31/17 1714  WBC 4.1  NEUTROABS 2.3  HGB 11.9*  HCT 35.8*  MCV 102.3*  PLT 235   Basic Metabolic Panel: Recent Labs  Lab 03/31/17 1714  NA 134*  K 4.2  CL 102  CO2 26  GLUCOSE 154*  BUN 12  CREATININE 1.04  CALCIUM 8.5*   Liver Function Tests: Recent Labs  Lab 03/31/17 1714  AST 60*  ALT 21  ALKPHOS 87  BILITOT 2.8*  PROT 8.9*  ALBUMIN 2.5*   Recent Labs  Lab 03/31/17 1714  LIPASE 41   Urine analysis:    Component Value Date/Time   COLORURINE AMBER (A) 01/05/2017 0631   APPEARANCEUR CLEAR 01/05/2017 0631   LABSPEC 1.028 01/05/2017 0631   PHURINE 5.0 01/05/2017 0631   GLUCOSEU NEGATIVE 01/05/2017 0631   HGBUR NEGATIVE 01/05/2017 0631   BILIRUBINUR SMALL (A) 01/05/2017 0631   KETONESUR NEGATIVE 01/05/2017 0631   PROTEINUR 30 (A) 01/05/2017 0631   NITRITE NEGATIVE 01/05/2017 0631   LEUKOCYTESUR NEGATIVE 01/05/2017 0631    Radiological Exams on Admission: Dg Chest 2 View  Result Date: 03/31/2017 CLINICAL DATA:  Short of breath EXAM: CHEST  2 VIEW COMPARISON:  01/04/2017 FINDINGS: Low lung volumes with elevation of the right diaphragm. Atelectasis in the right mid lung and right base. No focal consolidation or effusion. Cardiomediastinal silhouette within normal limits allowing for low lung volumes. Aortic atherosclerosis. No pneumothorax. IMPRESSION: Low lung volumes with right basilar atelectasis. Electronically Signed   By: Donavan Foil M.D.   On: 03/31/2017 17:45   EKG: Independently reviewed.   Assessment/Plan Principal Problem:   Fluid overload Active Problems:   Cirrhosis of liver with ascites (HCC)   Chronic hepatitis C without hepatic coma (HCC)   Fluid overload-marked abdominal distention, 3+ pitting edema bilaterally, scrotal swelling, likely 2/2 decompensated liver cirrhosis from untreated hep C. IV lasix 40 given in ED. No abdominal pain or leukocytosis to suggest  SBP. -Continue IV Lasix 40 twice daily -Order placed for ultrasound-guided paracentesis a.m. -Emphasized to patient the importance of following up on discharge -PT/INR -Resume aspirin spironolactone 100 daily -Strict input output -Daily weights -Fluid restrict - BMP daily while diuresing  Cirrhosis from chronic hep C-no significant alcohol abuse history -Follow-up GI outpatient - Platelet 157, will hold pharmacologic DVT prophylaxis for now  For paracentesis a.m.   DVT prophylaxis: Scds for now for paracentesis. Plt stable Code Status: Full  Family Communication: Sister Loraine at bedside Disposition Plan: 2-3 days  Consults called: None  Admission status: Inpt, tele.   Bethena Roys MD [facility] Pager [phone] From 6PM-2AM.  Otherwise please contact night-coverage [email or web address] Password [facility]  03/31/2017, 7:49 PM

## 2017-03-31 NOTE — ED Provider Notes (Signed)
[facility] DEPT Provider Note   CSN: [id number] Arrival date & time: 03/31/17  1614     History   Chief Complaint Chief Complaint  Patient presents with  . Ascites    HPI Jerry Mcintyre is a 62 y.o. male.  HPI  Patient presents with complaint of abdominal swelling as well as bilateral lower extremity swelling as well as scrotal swelling.  He was diagnosed with ascites due to hep C cirrhosis 2 months ago.  At that time he was admitted and had 12 L drawn off by paracentesis.  He was started on Lasix and Aldactone.  He states that he did not follow-up as an outpatient and took the 30 days of medication he was prescribed but did not continue taking the medication after the first 30 days.  He states over the past month he has started swelling and it has become progressively worse.  He now has such swelling in his abdomen that he feels it is difficult to take a deep breath.  No fever/chills.  No vomiting.  No abdominal pain other than discomfort due to distension.  There are no other associated systemic symptoms, there are no other alleviating or modifying factors.   Past Medical History:  Diagnosis Date  . Hernia, abdominal   . Hypertension   . 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 Fluid overload 03/31/2017  . Chronic hepatitis C without hepatic coma ([place])   . Thrombocytopenia ([place]) 01/05/2017  . Hemoptysis 01/05/2017  . Abdominal pain 01/05/2017  . Abdominal distention 01/05/2017  . Hypokalemia 01/05/2017  . Abnormal liver function 01/05/2017  . Ascites 01/05/2017  . Anasarca 01/05/2017  . Abdominal distension   . Cirrhosis of liver with ascites [facility])     Past Surgical History:  Procedure Laterality Date  . DENTAL SURGERY    . teeth removal         Home Medications    Prior to Admission medications   Medication Sig Start Date End Date Taking? Authorizing Provider  furosemide (LASIX) 40 MG  tablet Take 1 tablet (40 mg total) by mouth daily. 01/08/17   Rosita Fire, MD  pantoprazole (PROTONIX) 40 MG tablet Take 1 tablet (40 mg total) by mouth at bedtime. 01/07/17   Rosita Fire, MD  potassium chloride (KLOR-CON) 20 MEQ packet Take 20 mEq by mouth daily. 01/07/17   Rosita Fire, MD  spironolactone (ALDACTONE) 100 MG tablet Take 1 tablet (100 mg total) by mouth daily. 01/07/17   Rosita Fire, MD    Family History Family History  Problem Relation Age of Onset  . Stroke Mother   . Hypertension Mother     Social History Social History   Tobacco Use  . Smoking status: Former Smoker    Last attempt to quit: 11/01/2016    Years since quitting: 0.4  . Smokeless tobacco: Never Used  Substance Use Topics  . Alcohol use: Yes    Comment: "drink a beer in the summertime"  . Drug use: No     Allergies   Bee venom   Review of Systems Review of Systems  ROS reviewed and all otherwise negative except for mentioned in HPI   Physical Exam Updated Vital Signs BP (!) 148/100 (BP Location: Left Arm)   Pulse 87   Temp 98.6 F (37 C) (Oral)   Resp 15   SpO2 97%  Vitals reviewed Physical Exam  Physical Examination: General appearance - alert, chronically ill appearing, and in no distress Mental status - alert, oriented to person, place, and time Eyes -mild scleral icterus, no conjunctival injection Mouth - mucous membranes moist, pharynx normal without lesions Neck - supple, no significant adenopathy Chest - clear to auscultation, no wheezes, rales or rhonchi, symmetric air entry, decreased breath sounds Heart - normal rate, regular rhythm, normal S1, S2, no murmurs, rubs, clicks or gallops Abdomen - soft, nontender, very distended, no rebound tenderness or gaurding Neurological - alert, oriented, normal speech, Extremities - peripheral pulses normal, bilateral lower extremity/ pedal edema, no clubbing or cyanosis Skin - normal coloration and  turgor, no rashes   ED Treatments / Results  Labs (all labs ordered are listed, but only abnormal results are displayed) Labs Reviewed  CBC WITH DIFFERENTIAL/PLATELET - Abnormal; Notable for the following components:      Result Value   RBC 3.50 (*)    Hemoglobin 11.9 (*)    HCT 35.8 (*)    MCV 102.3 (*)    All other components within normal limits  COMPREHENSIVE METABOLIC PANEL - Abnormal; Notable for the following components:   Sodium 134 (*)    Glucose, Bld 154 (*)    Calcium 8.5 (*)    Total Protein 8.9 (*)    Albumin 2.5 (*)    AST 60 (*)    Total Bilirubin 2.8 (*)    All other components within normal limits  PROTIME-INR - Abnormal; Notable for the following components:   Prothrombin Time 16.3 (*)    All other components within normal limits  LIPASE, BLOOD    EKG  EKG Interpretation None       Radiology Dg Chest 2 View  Result Date: 03/31/2017 CLINICAL DATA:  Short of breath EXAM: CHEST  2 VIEW COMPARISON:  01/04/2017 FINDINGS: Low lung volumes with elevation of the right diaphragm. Atelectasis in the right mid lung and right base. No focal consolidation or effusion. Cardiomediastinal silhouette within normal limits allowing for low lung volumes. Aortic atherosclerosis. No pneumothorax. IMPRESSION: Low lung volumes with right basilar atelectasis. Electronically Signed   By: Donavan Foil M.D.   On: 03/31/2017 17:45    Procedures Procedures (including critical care time)  Medications Ordered in ED Medications  furosemide (LASIX) injection 40 mg (not administered)  spironolactone (ALDACTONE) tablet 100 mg (100 mg Oral Given 03/31/17 2023)  furosemide (LASIX) injection 40 mg (40 mg Intravenous Given 03/31/17 1857)     Initial Impression / Assessment and Plan / ED Course  I have reviewed the triage vital signs and the nursing notes.  Pertinent labs & imaging results that were available during my care of the patient were reviewed by me and considered in my  medical decision making (see chart for details).    6:44 PM  D/w [facility] for admission.    Patient presenting with abdominal distention that is been worsening and is now causing him to be short of breath.  He was recently diagnosed with hep C cirrhosis and ascites however did not continue taking outpatient medications and has not followed up with GI.  Doubt SBP given no abdominal tenderness, no fever and no elevation in white count.  Patient will need large volume paracentesis and diuresis as well.  Discussed with hospitalist for admission.  Discussed with patient the importance of outpatient follow-up and to take medications as prescribed to help relieve the symptoms that he is currently experiencing.  Final Clinical Impressions(s) / ED Diagnoses  Final diagnoses:  Other ascites  Dyspnea, unspecified type    ED Discharge Orders    None       Pixie Casino, MD 03/31/17 2055

## 2017-04-01 ENCOUNTER — Inpatient Hospital Stay (HOSPITAL_COMMUNITY): Payer: Medicare Other

## 2017-04-01 DIAGNOSIS — E8770 Fluid overload, unspecified: Secondary | ICD-10-CM

## 2017-04-01 LAB — BASIC METABOLIC PANEL
ANION GAP: 8 (ref 5–15)
BUN: 12 mg/dL (ref 6–20)
CALCIUM: 8.2 mg/dL — AB (ref 8.9–10.3)
CO2: 28 mmol/L (ref 22–32)
Chloride: 101 mmol/L (ref 101–111)
Creatinine, Ser: 1.05 mg/dL (ref 0.61–1.24)
GFR calc Af Amer: >60 mL/min (ref >60)
GLUCOSE: 111 mg/dL — AB (ref 65–99)
Potassium: 3.8 mmol/L (ref 3.5–5.1)
Sodium: 137 mmol/L (ref 135–145)

## 2017-04-01 LAB — BODY FLUID CELL COUNT WITH DIFFERENTIAL
LYMPHS FL: 36 %
MONOCYTE-MACROPHAGE-SEROUS FLUID: 62 % (ref 50–90)
Neutrophil Count, Fluid: 2 % (ref 0–25)
Total Nucleated Cell Count, Fluid: 125 cu mm (ref 0–1000)

## 2017-04-01 LAB — ALBUMIN, PLEURAL OR PERITONEAL FLUID

## 2017-04-01 LAB — GRAM STAIN

## 2017-04-01 LAB — PROTEIN, PLEURAL OR PERITONEAL FLUID

## 2017-04-01 LAB — GLUCOSE, PLEURAL OR PERITONEAL FLUID: Glucose, Fluid: 118 mg/dL

## 2017-04-01 MED ORDER — LIDOCAINE HCL 1 % IJ SOLN
INTRAMUSCULAR | Status: AC
  Filled 2017-04-01: qty 10

## 2017-04-01 MED ORDER — ALBUMIN HUMAN 25 % IV SOLN
100.0000 g | Freq: Once | INTRAVENOUS | Status: AC
  Administered 2017-04-01 (×8): 12.5 g via INTRAVENOUS
  Filled 2017-04-01: qty 400

## 2017-04-01 MED ORDER — FUROSEMIDE 40 MG PO TABS
40.0000 mg | ORAL_TABLET | Freq: Two times a day (BID) | ORAL | Status: DC
  Administered 2017-04-01 – 2017-04-02 (×2): 40 mg via ORAL
  Filled 2017-04-01 (×2): qty 1

## 2017-04-01 NOTE — Progress Notes (Signed)
PT Cancellation Note  Patient Details Name: Jerry Mcintyre MRN: [id number] DOB: 26-Mar-1955   Cancelled Treatment:     Spoke with pt at length, tried to encourage pt to get up with PT at this time, however pt insisted (politely) to wait until he received all his albumin IV. Pt stated he has been getting up to the bathroom in his room with not a lot of difficulties, just states that his feet hurt a little and his legs from the swelling. He lives in a home on one level, ramped entrance and uses no assistive devices to ambulate.   Encouraged pt to ambulate with nurses when he feels ready, and PT will check back tomorrow.    Clide Dales 04/01/2017, 4:56 PM Clide Dales, PT Pager: [phone] 04/01/2017

## 2017-04-01 NOTE — Progress Notes (Signed)
[facility] Progress Note  Patient: Jerry Mcintyre HCW:[id number]   PCP: Patient, No Pcp Per DOB: 04-Apr-1955   DOA: 03/31/2017   DOS: 04/01/2017   Date of Service: the patient was seen and examined on 04/01/2017  Subjective: Patient mentions that the reason he did not see any provider as he did not have any transportation, weather was not good and he did not felt the need to reschedule as well.  He was actually eating a lot after going home as he felt that the weight loss was concerning.  He has some complain about trouble breathing, abdominal distention, leg swelling.  No nausea no vomiting.  Complains of abdominal pain no diarrhea no constipation.  Brief hospital course: Pt. with PMH of chronic hep C with liver cirrhosis; admitted on 03/31/2017, presented with complaint of abdominal pain and distention, was found to have worsening ascites due to noncompliance. Currently further plan is continue current plan.  Assessment and Plan: 1.  Recurrent ascites. Noncompliance. Chronic hep C with cirrhosis of the liver. Volume overload. Patient remains noncompliant with medical regimen and has not followed up with gastroenterology as well as PCP as an outpatient. Recommended patient to maintain compliance and maintain outpatient follow-ups. S/P paracentesis, 6 L fluid removed. WBC not elevated no concern for SBP. Starting the patient back on Aldactone as well as Lasix. Continuing Protonix. Do not feel that the patient will require a repeat paracentesis. She was given IV albumin after the procedure.  2.  Chronic macrocytic anemia. In the setting of liver cirrhosis. Also possibly poor p.o. intake. Check B12.  3.  Liver cirrhosis. Does not appear to be significantly decompensated. All the patient remains noncompliant which will be difficult to manage in the future. Monitor for now.  4.  Global T. Patient actually had elevated INR as well as thrombus cytopenia during last admission but  currently appears to be having stable numbers. Monitor.  Diet: Low-salt diet DVT Prophylaxis: mechanical compression device  Advance goals of care discussion: full code  Family Communication: no family was present at bedside, at the time of interview.   Disposition:  Discharge to home tomorrow.  Consultants: none Procedures: none  Antibiotics: Anti-infectives (From admission, onward)   None       Objective: Physical Exam: Vitals:   04/01/17 1120 04/01/17 1123 04/01/17 1132 04/01/17 1427  BP: 124/65 (!) 117/53 125/72 (!) 109/58  Pulse:    88  Resp:    20  Temp:    99.2 F (37.3 C)  TempSrc:    Oral  SpO2:    97%  Weight:      Height:        Intake/Output Summary (Last 24 hours) at 04/01/2017 1900 Last data filed at 04/01/2017 1543 Gross per 24 hour  Intake 700 ml  Output -  Net 700 ml   Filed Weights   03/31/17 2114 04/01/17 0510  Weight: 104.6 kg (230 lb 9.6 oz) 105.2 kg (231 lb 14.8 oz)   General: Alert, Awake and Oriented to Time, Place and Person. Appear in mild distress, affect appropriate Eyes: PERRL, Conjunctiva normal ENT: Oral Mucosa clear moist. Neck: no JVD, no Abnormal Mass Or lumps Cardiovascular: S1 and S2 Present, no Murmur, Peripheral Pulses Present Respiratory: normal respiratory effort, Bilateral Air entry equal and Decreased, no use of accessory muscle, Clear to Auscultation, no Crackles, no wheezes Abdomen: Bowel Sound present, Soft and mild tenderness, no hernia Skin: no redness, no Rash, no induration Extremities: bilateral  Pedal edema,  no calf tenderness Neurologic: Grossly no focal neuro deficit. Bilaterally Equal motor strength  Data Reviewed: CBC: Recent Labs  Lab 03/31/17 1714  WBC 4.1  NEUTROABS 2.3  HGB 11.9*  HCT 35.8*  MCV 102.3*  PLT 381   Basic Metabolic Panel: Recent Labs  Lab 03/31/17 1714 04/01/17 0458  NA 134* 137  K 4.2 3.8  CL 102 101  CO2 26 28  GLUCOSE 154* 111*  BUN 12 12  CREATININE 1.04 1.05    CALCIUM 8.5* 8.2*    Liver Function Tests: Recent Labs  Lab 03/31/17 1714  AST 60*  ALT 21  ALKPHOS 87  BILITOT 2.8*  PROT 8.9*  ALBUMIN 2.5*   Recent Labs  Lab 03/31/17 1714  LIPASE 41   No results for input(s): AMMONIA in the last 168 hours. Coagulation Profile: Recent Labs  Lab 03/31/17 1950  INR 1.33   Cardiac Enzymes: No results for input(s): CKTOTAL, CKMB, CKMBINDEX, TROPONINI in the last 168 hours. BNP (last 3 results) No results for input(s): PROBNP in the last 8760 hours. CBG: No results for input(s): GLUCAP in the last 168 hours. Studies: US Paracentesis  Result Date: 04/01/2017 INDICATION: Hepatitis-C, cirrhosis, recurrent ascites. Request for diagnostic and therapeutic paracentesis up to 5 liters. EXAM: ULTRASOUND GUIDED RIGHT LATERAL ABDOMEN PARACENTESIS MEDICATIONS: 1% Lidocaine = 10 mL COMPLICATIONS: None immediate. PROCEDURE: Informed written consent was obtained from the patient after a discussion of the risks, benefits and alternatives to treatment. A timeout was performed prior to the initiation of the procedure. Initial ultrasound scanning demonstrates a large amount of ascites within the right lateral abdomen. The right lateral abdomen was prepped and draped in the usual sterile fashion. 1% lidocaine with epinephrine was used for local anesthesia. Following this, a 19 gauge, 7-cm, Yueh catheter was introduced. An ultrasound image was saved for documentation purposes. The paracentesis was performed. The catheter was removed and a dressing was applied. The patient tolerated the procedure well without immediate post procedural complication. FINDINGS: A total of approximately 5 liters of clear yellow fluid was removed. Samples were sent to the laboratory as requested by the clinical team. IMPRESSION: Successful ultrasound-guided paracentesis yielding 5 liters of peritoneal fluid. Read by: Gareth Eagle, PA-C Electronically Signed   By: Aletta Edouard M.D.   On:  04/01/2017 13:24    Scheduled Meds: . furosemide  40 mg Oral BID  . pantoprazole  40 mg Oral QHS  . spironolactone  100 mg Oral Daily   Continuous Infusions: PRN Meds: ondansetron **OR** ondansetron (ZOFRAN) IV, oxyCODONE, traMADol  Time spent: 35 minutes  Author: Berle Mull, MD [facility] Pager: [phone] 04/01/2017 7:00 PM  If 7PM-7AM, please contact night-coverage at [email or web address], password [facility]

## 2017-04-01 NOTE — Plan of Care (Signed)
  Progressing Education: Knowledge of General Education information will improve 04/01/2017 2229 - Progressing by Talbert Forest, RN Nutrition: Adequate nutrition will be maintained 04/01/2017 2229 - Progressing by Talbert Forest, RN Coping: Level of anxiety will decrease 04/01/2017 2229 - Progressing by Talbert Forest, RN Elimination: Will not experience complications related to urinary retention 04/01/2017 2229 - Progressing by Talbert Forest, RN Pain Managment: General experience of comfort will improve 04/01/2017 2229 - Progressing by Talbert Forest, RN Skin Integrity: Risk for impaired skin integrity will decrease 04/01/2017 2229 - Progressing by Talbert Forest, RN

## 2017-04-01 NOTE — Procedures (Signed)
PROCEDURE SUMMARY:  Successful US guided paracentesis from right lateral abdomen.  Yielded 5 liters of clear yellow fluid.  No immediate complications.  Pt tolerated well.   Specimen was sent for labs.  Roe Koffman S Aileena Iglesia PA-C 04/01/2017 1:24 PM

## 2017-04-02 LAB — BASIC METABOLIC PANEL
Anion gap: 5 (ref 5–15)
BUN: 11 mg/dL (ref 6–20)
CALCIUM: 8.4 mg/dL — AB (ref 8.9–10.3)
CHLORIDE: 101 mmol/L (ref 101–111)
CO2: 29 mmol/L (ref 22–32)
CREATININE: 1.16 mg/dL (ref 0.61–1.24)
Glucose, Bld: 94 mg/dL (ref 65–99)
Potassium: 4.1 mmol/L (ref 3.5–5.1)
SODIUM: 135 mmol/L (ref 135–145)

## 2017-04-02 LAB — CBC
HCT: 30.1 % — ABNORMAL LOW (ref 39.0–52.0)
Hemoglobin: 10 g/dL — ABNORMAL LOW (ref 13.0–17.0)
MCH: 33.9 pg (ref 26.0–34.0)
MCHC: 33.2 g/dL (ref 30.0–36.0)
MCV: 102 fL — AB (ref 78.0–100.0)
PLATELETS: 127 10*3/uL — AB (ref 150–400)
RBC: 2.95 MIL/uL — AB (ref 4.22–5.81)
RDW: 14.8 % (ref 11.5–15.5)
WBC: 4.1 10*3/uL (ref 4.0–10.5)

## 2017-04-02 LAB — PROTIME-INR
INR: 1.85
PROTHROMBIN TIME: 21.2 s — AB (ref 11.4–15.2)

## 2017-04-02 LAB — MAGNESIUM: MAGNESIUM: 1.9 mg/dL (ref 1.7–2.4)

## 2017-04-02 MED ORDER — FUROSEMIDE 10 MG/ML IJ SOLN: 40.0000 mg | Freq: Once | INTRAMUSCULAR | Status: DC

## 2017-04-02 MED ORDER — FUROSEMIDE 20 MG PO TABS: 60.0000 mg | ORAL_TABLET | Freq: Two times a day (BID) | ORAL | Status: DC

## 2017-04-02 MED ORDER — SPIRONOLACTONE 25 MG PO TABS
200.0000 mg | ORAL_TABLET | Freq: Every day | ORAL | Status: DC
  Administered 2017-04-03: 200 mg via ORAL
  Filled 2017-04-02: qty 8

## 2017-04-02 MED ORDER — FUROSEMIDE 40 MG PO TABS: 60.0000 mg | ORAL_TABLET | Freq: Two times a day (BID) | ORAL | Status: DC

## 2017-04-02 MED ORDER — SPIRONOLACTONE 25 MG PO TABS
100.0000 mg | ORAL_TABLET | Freq: Once | ORAL | Status: AC
  Administered 2017-04-02: 100 mg via ORAL
  Filled 2017-04-02: qty 4

## 2017-04-02 MED ORDER — FUROSEMIDE 40 MG PO TABS
60.0000 mg | ORAL_TABLET | Freq: Two times a day (BID) | ORAL | Status: DC
  Administered 2017-04-02 – 2017-04-03 (×2): 60 mg via ORAL
  Filled 2017-04-02 (×2): qty 1

## 2017-04-02 NOTE — Discharge Instructions (Signed)
Ascites °Ascites is a collection of excess fluid in the abdomen. Ascites can range from mild to severe. It can get worse without treatment. °What are the causes? °Possible causes include: °· Cirrhosis. This is the most common cause of ascites. °· Infection or inflammation in the abdomen. °· Cancer in the abdomen. °· Heart failure. °· Kidney disease. °· Inflammation of the pancreas. °· Clots in the veins of the liver. ° °What are the signs or symptoms? °Signs and symptoms may include: °· A feeling of fullness in your abdomen. This is common. °· An increase in the size of your abdomen or your waist. °· Swelling in your legs. °· Swelling of the scrotum in men. °· Difficulty breathing. °· Abdominal pain. °· Sudden weight gain. ° °If the condition is mild, you may not have symptoms. °How is this diagnosed? °To make a diagnosis, your health care provider will: °· Ask about your medical history. °· Perform a physical exam. °· Order imaging tests, such as an ultrasound or CT scan of your abdomen. ° °How is this treated? °Treatment depends on the cause of the ascites. It may include: °· Taking a pill to make you urinate. This is called a water pill (diuretic pill). °· Strictly reducing your salt (sodium) intake. Salt can cause extra fluid to be kept in the body, and this makes ascites worse. °· Having a procedure to remove fluid from your abdomen (paracentesis). °· Having a procedure to transfer fluid from your abdomen into a vein. °· Having a procedure that connects two of the major veins within your liver and relieves pressure on your liver (TIPS procedure). ° °Ascites may go away or improve with treatment of the condition that caused it. °Follow these instructions at home: °· Keep track of your weight. To do this, weigh yourself at the same time every day and record your weight. °· Keep track of how much you drink and any changes in the amount you urinate. °· Follow any instructions that your health care provider gives  you about how much to drink. °· Try not to eat salty (high-sodium) foods. °· Take medicines only as directed by your health care provider. °· Keep all follow-up visits as directed by your health care provider. This is important. °· Report any changes in your health to your health care provider, especially if you develop new symptoms or your symptoms get worse. °Contact a health care provider if: °· Your gain more than 3 pounds in 3 days. °· Your abdominal size or your waist size increases. °· You have new swelling in your legs. °· The swelling in your legs gets worse. °Get help right away if: °· You develop a fever. °· You develop confusion. °· You develop new or worsening difficulty breathing. °· You develop new or worsening abdominal pain. °· You develop new or worsening swelling in the scrotum (in men). °This information is not intended to replace advice given to you by your health care provider. Make sure you discuss any questions you have with your health care provider. °Document Released: 01/18/2005 Document Revised: 05/28/2015 Document Reviewed: 08/17/2013 °Elsevier Interactive Patient Education © 2018 [employer] ° °

## 2017-04-02 NOTE — Progress Notes (Signed)
Patient walked 14ft with RN, no SOB noted, no assistive devices used, O2 sats stayed above 95% on RA.

## 2017-04-02 NOTE — Progress Notes (Signed)
PT Note  Patient Details Name: Jerry Mcintyre MRN: [id number] DOB: [birth year]        Checked on pt again this morning. He states he has been ambulating in the room independently, and just settled down after being int the bathroom independently cleaning himself for the morning. He politely refused a hallway walk at  This time stating he is just getting settled in and that he doesn't have any concerns about ambulating at this time, just more limited than his baseline due to the edema in BLEs and scrotum area.   Relayed this to this nurse who will also try to encourage walking this afternoon.    Clide Dales 04/02/2017, 12:46 PM  Clide Dales, PT Pager: [phone] 04/02/2017

## 2017-04-02 NOTE — Progress Notes (Signed)
[facility] Progress Note  Patient: Jerry Mcintyre PNT:[id number]   PCP: Patient, No Pcp Per DOB: 12-16-1955   DOA: 03/31/2017   DOS: 04/02/2017   Date of Service: the patient was seen and examined on 04/02/2017  Subjective: Feeling better no nausea no vomiting no fever no chills.  Brief hospital course: Pt. with PMH of chronic hep C with liver cirrhosis; admitted on 03/31/2017, presented with complaint of abdominal pain and distention, was found to have worsening ascites due to noncompliance. Currently further plan is continue current plan.  Assessment and Plan: 1.  Recurrent ascites. Noncompliance. Chronic hep C with cirrhosis of the liver. Volume overload. Patient remains noncompliant with medical regimen and has not followed up with gastroenterology as well as PCP as an outpatient. Recommended patient to maintain compliance and maintain outpatient follow-ups. S/P paracentesis, 6 L fluid removed. WBC not elevated no concern for SBP. Starting the patient back on Aldactone as well as Lasix. Continuing Protonix. Do not feel that the patient will require a repeat paracentesis. She was given IV albumin after the procedure. Discussed with GI, recommend to continue volume removal with Lasix and Aldactone with dose increase.  Monitor.  2.  Chronic macrocytic anemia. In the setting of liver cirrhosis. Also possibly poor p.o. intake. Check B12.  3.  Liver cirrhosis. Does not appear to be significantly decompensated. All the patient remains noncompliant which will be difficult to manage in the future. Monitor for now.  4.  Coagulopathy Patient actually had elevated INR as well as thrombus cytopenia during last admission but currently appears to be having stable numbers. Monitor.  Diet: Low-salt diet DVT Prophylaxis: mechanical compression device  Advance goals of care discussion: full code  Family Communication: no family was present at bedside, at the time of interview.    Disposition:  Discharge to home tomorrow.  Consultants: none Procedures: none  Antibiotics: Anti-infectives (From admission, onward)   None       Objective: Physical Exam: Vitals:   04/01/17 2157 04/02/17 0500 04/02/17 0544 04/02/17 1310  BP: 113/75  119/75 109/79  Pulse: 81  80 85  Resp: 18  16 16   Temp: 97.9 F (36.6 C)  98.3 F (36.8 C) 98.5 F (36.9 C)  TempSrc: Oral  Oral Oral  SpO2: 98%  97% 96%  Weight:  100.9 kg (222 lb 7.1 oz)    Height:        Intake/Output Summary (Last 24 hours) at 04/02/2017 1955 Last data filed at 04/02/2017 0536 Gross per 24 hour  Intake 700 ml  Output -  Net 700 ml   Filed Weights   03/31/17 2114 04/01/17 0510 04/02/17 0500  Weight: 104.6 kg (230 lb 9.6 oz) 105.2 kg (231 lb 14.8 oz) 100.9 kg (222 lb 7.1 oz)   General: Alert, Awake and Oriented to Time, Place and Person. Appear in mild distress, affect appropriate Eyes: PERRL, Conjunctiva normal ENT: Oral Mucosa clear moist. Neck: no JVD, no Abnormal Mass Or lumps Cardiovascular: S1 and S2 Present, no Murmur, Peripheral Pulses Present Respiratory: normal respiratory effort, Bilateral Air entry equal and Decreased, no use of accessory muscle, Clear to Auscultation, no Crackles, no wheezes Abdomen: Bowel Sound present, Soft and mild tenderness, no hernia Skin: no redness, no Rash, no induration Extremities: bilateral  Pedal edema, no calf tenderness Neurologic: Grossly no focal neuro deficit. Bilaterally Equal motor strength  Data Reviewed: CBC: Recent Labs  Lab 03/31/17 1714 04/02/17 0459  WBC 4.1 4.1  NEUTROABS 2.3  --  HGB 11.9* 10.0*  HCT 35.8* 30.1*  MCV 102.3* 102.0*  PLT 157 161*   Basic Metabolic Panel: Recent Labs  Lab 03/31/17 1714 04/01/17 0458 04/02/17 0459  NA 134* 137 135  K 4.2 3.8 4.1  CL 102 101 101  CO2 26 28 29   GLUCOSE 154* 111* 94  BUN 12 12 11   CREATININE 1.04 1.05 1.16  CALCIUM 8.5* 8.2* 8.4*  MG  --   --  1.9    Liver Function  Tests: Recent Labs  Lab 03/31/17 1714  AST 60*  ALT 21  ALKPHOS 87  BILITOT 2.8*  PROT 8.9*  ALBUMIN 2.5*   Recent Labs  Lab 03/31/17 1714  LIPASE 41   No results for input(s): AMMONIA in the last 168 hours. Coagulation Profile: Recent Labs  Lab 03/31/17 1950 04/02/17 0459  INR 1.33 1.85   Cardiac Enzymes: No results for input(s): CKTOTAL, CKMB, CKMBINDEX, TROPONINI in the last 168 hours. BNP (last 3 results) No results for input(s): PROBNP in the last 8760 hours. CBG: No results for input(s): GLUCAP in the last 168 hours. Studies: No results found.  Scheduled Meds: . furosemide  60 mg Oral BID  . pantoprazole  40 mg Oral QHS  . [START ON 04/03/2017] spironolactone  200 mg Oral Daily   Continuous Infusions: PRN Meds: ondansetron **OR** ondansetron (ZOFRAN) IV, traMADol  Time spent: 35 minutes  Author: Berle Mull, MD [facility] Pager: [phone] 04/02/2017 7:55 PM  If 7PM-7AM, please contact night-coverage at [email or web address], password [facility]

## 2017-04-02 NOTE — Progress Notes (Signed)
SATURATION QUALIFICATIONS: (This note is used to comply with regulatory documentation for home oxygen)  Patient Saturations on Room Air at Rest = 95%  Patient Saturations on Room Air while Ambulating = 97%  Please briefly explain why patient needs home oxygen:

## 2017-04-03 LAB — BASIC METABOLIC PANEL
Anion gap: 7 (ref 5–15)
BUN: 11 mg/dL (ref 6–20)
CO2: 28 mmol/L (ref 22–32)
CREATININE: 1.04 mg/dL (ref 0.61–1.24)
Calcium: 8.4 mg/dL — ABNORMAL LOW (ref 8.9–10.3)
Chloride: 96 mmol/L — ABNORMAL LOW (ref 101–111)
GFR calc Af Amer: >60 mL/min (ref >60)
GFR calc non Af Amer: >60 mL/min (ref >60)
GLUCOSE: 86 mg/dL (ref 65–99)
POTASSIUM: 3.5 mmol/L (ref 3.5–5.1)
SODIUM: 131 mmol/L — AB (ref 135–145)

## 2017-04-03 LAB — CBC
HEMATOCRIT: 31.5 % — AB (ref 39.0–52.0)
Hemoglobin: 10.7 g/dL — ABNORMAL LOW (ref 13.0–17.0)
MCH: 34 pg (ref 26.0–34.0)
MCHC: 34 g/dL (ref 30.0–36.0)
MCV: 100 fL (ref 78.0–100.0)
PLATELETS: 132 10*3/uL — AB (ref 150–400)
RBC: 3.15 MIL/uL — ABNORMAL LOW (ref 4.22–5.81)
RDW: 14.6 % (ref 11.5–15.5)
WBC: 4.4 10*3/uL (ref 4.0–10.5)

## 2017-04-03 LAB — VITAMIN B12: Vitamin B-12: 356 pg/mL (ref 180–914)

## 2017-04-03 LAB — MAGNESIUM: Magnesium: 1.6 mg/dL — ABNORMAL LOW (ref 1.7–2.4)

## 2017-04-03 MED ORDER — PANTOPRAZOLE SODIUM 40 MG PO TBEC: 40.0000 mg | DELAYED_RELEASE_TABLET | Freq: Every day | ORAL | 0 refills | Status: DC

## 2017-04-03 MED ORDER — METHOCARBAMOL 500 MG PO TABS: 500.0000 mg | ORAL_TABLET | Freq: Three times a day (TID) | ORAL | 0 refills | Status: DC | PRN

## 2017-04-03 MED ORDER — OXYCODONE HCL 5 MG PO TABS
10.0000 mg | ORAL_TABLET | Freq: Once | ORAL | Status: AC
  Administered 2017-04-03: 10 mg via ORAL
  Filled 2017-04-03: qty 2

## 2017-04-03 MED ORDER — SPIRONOLACTONE 100 MG PO TABS: 200.0000 mg | ORAL_TABLET | Freq: Every day | ORAL | 0 refills | Status: DC

## 2017-04-03 MED ORDER — POTASSIUM CHLORIDE CRYS ER 20 MEQ PO TBCR
40.0000 meq | EXTENDED_RELEASE_TABLET | Freq: Once | ORAL | Status: AC
  Administered 2017-04-03: 40 meq via ORAL
  Filled 2017-04-03: qty 2

## 2017-04-03 MED ORDER — MAGNESIUM SULFATE 2 GM/50ML IV SOLN
2.0000 g | Freq: Once | INTRAVENOUS | Status: AC
  Administered 2017-04-03: 2 g via INTRAVENOUS
  Filled 2017-04-03: qty 50

## 2017-04-03 MED ORDER — FUROSEMIDE 40 MG PO TABS: 40.0000 mg | ORAL_TABLET | Freq: Two times a day (BID) | ORAL | 0 refills | Status: DC

## 2017-04-03 NOTE — Care Management Note (Signed)
Case Management Note  Patient Details  Name: Jerry Mcintyre MRN: [id number] Date of Birth: 11-Dec-1955  Subjective/Objective:      Reoccurring  Ascites, Hep C             Action/Plan: NCM spoke to pt and offered choice for HH/list provided. Pt declined [facility] RN. Lives alone. Sisters will assist as needed. Has a RW at home, but does not need to use. Explained to pt importance of follow up with his PCP.   PCP Harlan Stains MD  Expected Discharge Date:  04/03/17               Expected Discharge Plan:  [place]  In-House Referral:  NA  Discharge planning Services  CM Consult  Post Acute Care Choice:  Home Health Choice offered to:  Patient  DME Arranged:  N/A DME Agency:  NA  HH Arranged:  Patient Refused [facility] Agency:  NA  Status of Service:  Completed, signed off  If discussed at [place] of Stay Meetings, dates discussed:    Additional Comments:  Erenest Rasher, RN 04/03/2017, 12:24 PM

## 2017-04-03 NOTE — Care Management Important Message (Signed)
Important Message  Patient Details  Name: Jerry Mcintyre MRN: [id number] Date of Birth: [birth year]   Medicare Important Message Given:  Yes    Erenest Rasher, RN 04/03/2017, 12:22 PM

## 2017-04-03 NOTE — Progress Notes (Signed)
Reviewed AVS with pt. Pt able to teach back medication schedule. Pt knows to see PCP if weight gain > 3lbs. Pt able to change own dressing to abdomen. Still refuses Home health. Has no questions at this time. Ride will come at 1530

## 2017-04-03 NOTE — Progress Notes (Signed)
PT Cancellation Note  Patient Details Name: Jerry Mcintyre MRN: [id number] DOB: 1955/04/05   Cancelled Treatment:    Reason Eval/Treat Not Completed: PT screened, no needs identified, will sign off. Pt refused PT evaluation last 2 days.  Yesterday he ambulated with nursing without difficulty.  Spoke with pt and he states he has been getting up in room [place] and has handicapped accessible home with ramped entry.  No skilled PT needs identified and will sign off at this time.  Pt was in agreement.   Galen Manila 04/03/2017, 10:11 AM

## 2017-04-05 NOTE — Discharge Summary (Signed)
[facility] Discharge Summary   Patient: Jerry Mcintyre ZOX:[id number]   PCP: Harlan Stains, MD DOB: 12/12/1955   Date of admission: 03/31/2017   Date of discharge: 04/03/2017    Discharge Diagnoses:  Principal Problem:   Fluid overload Active Problems:   Cirrhosis of liver with ascites ([place])   Chronic hepatitis C without hepatic coma ([place])   Admitted From: home Disposition:  home  Recommendations for Outpatient Follow-up:  1. This follow-up with PCP in 1 week. 2. Also recommend to reconnect with GI and follow-up with them in 1 week.  Follow-up Information    PCP. Schedule an appointment as soon as possible for a visit in 1 week(s).        Wilford Corner, MD. Schedule an appointment as soon as possible for a visit in 1 week(s).   Specialty:  Gastroenterology Contact information: [address] [place] [address] [phone]          Diet recommendation: Cardiac diet  Activity: The patient is advised to gradually reintroduce usual activities.  Discharge Condition: good  Code Status: Full code  History of present illness: As per the H and P dictated on admission, "Jerry Mcintyre is a 62 y.o. male with medical history significant for liver cirrhosis chronic hep C, tented to the ED with complaints of abdominal distention leg swelling scrotal swelling shortness of breath of 4- 6 weeks duration.  Patient was admitted 01/04/17- 01/07/17, with similar symptoms, with new diagnoses of cirrhosis.  He had 2 paracentesis on admission, and total of 12 L of ascitic fluid drawn. He reported being told several years in the past that he had hepatitis C but did not follow-up.  Patient was sent home on 1 months prescription of Lasix, spironolactone which was compliant with but he did not follow up with GI as arranged prior to discharge, and so he could not get refills. He reports he missed his appointment because he was in bad shape, it was cold and snowing.  His  sister says she encouraged patient severally to make this appointment, but he refused.   Pt denies fever chills nausea vomiting no abdominal pain just feels his abdomen is tense"  Hospital Course:  Summary of his active problems in the hospital is as following. 1.  Recurrent ascites. Noncompliance. Chronic hep C with cirrhosis of the liver. Volume overload. Patient remains noncompliant with medical regimen and has not followed up with gastroenterology as well as PCP as an outpatient. Recommended patient to maintain compliance and maintain outpatient follow-ups. S/P paracentesis, 6 L fluid removed. WBC not elevated no concern for SBP. Starting the patient back on Aldactone as well as Lasix. Continuing Protonix. Do not feel that the patient will require a repeat paracentesis. She was given IV albumin after the procedure. Discussed with GI, recommend outpatient follow-up. Will discharge on increased dose of Lasix and Aldactone.  Patient recommended to follow-up with PCP as well as GI in 1 week to reassess the need for current dosage.  2.  Chronic macrocytic anemia. In the setting of liver cirrhosis. Also possibly poor p.o. intake. Stable B12.  3.  Liver cirrhosis. Does not appear to be significantly decompensated. All the patient remains noncompliant which will be difficult to manage in the future. Monitor for now.  Follow-up with GI  4.  Coagulopathy Patient actually had elevated INR as well as thrombocytopenia during last admission but currently appears to be having stable numbers.  All other chronic medical condition were  stable during the hospitalization.  Patient was seen by physical therapy, who recommended no PT follow up needed  I recommended home health RN monitoring as I question patient's compliance on discharge.  Patient refused home health RN. On the day of the discharge the patient's vitals were stable , and no other acute medical condition were reported by patient.  the patient was felt safe to be discharge at home with family.  Procedures and Results:  Ultrasound-guided paracentesis  Consultations:  IR  DISCHARGE MEDICATION: Allergies as of 04/03/2017      Reactions   Bee Venom       Medication List    TAKE these medications   furosemide 40 MG tablet Commonly known as:  LASIX Take 1 tablet (40 mg total) by mouth 2 (two) times daily. For 1 week, start 40 mg daily from 04/10/2017 What changed:    when to take this  additional instructions   methocarbamol 500 MG tablet Commonly known as:  ROBAXIN Take 1 tablet (500 mg total) by mouth every 8 (eight) hours as needed for muscle spasms.   pantoprazole 40 MG tablet Commonly known as:  PROTONIX Take 1 tablet (40 mg total) by mouth daily. What changed:  when to take this   potassium chloride 20 MEQ packet Commonly known as:  KLOR-CON Take 20 mEq by mouth daily.   spironolactone 100 MG tablet Commonly known as:  ALDACTONE Take 2 tablets (200 mg total) by mouth daily. What changed:  how much to take      Allergies  Allergen Reactions  . Bee Venom    Discharge Instructions    Diet - low sodium heart healthy   Complete by:  As directed    Discharge instructions   Complete by:  As directed    It is important that you read following instructions as well as go over your medication list with RN to help you understand your care after this hospitalization.  Discharge Instructions: Please follow-up with PCP in one week  Please request your primary care physician to go over all Hospital Tests and Procedure/Radiological results at the follow up,  Please get all Hospital records sent to your PCP by signing hospital release before you go home.   Do not take more than prescribed Pain, Sleep and Anxiety Medications. You were cared for by a hospitalist during your hospital stay. If you have any questions about your discharge medications or the care you received while you were in the  hospital after you are discharged, you can call the unit and ask to speak with the hospitalist on call if the hospitalist that took care of you is not available.  Once you are discharged, your primary care physician will handle any further medical issues. Please note that NO REFILLS for any discharge medications will be authorized once you are discharged, as it is imperative that you return to your primary care physician (or establish a relationship with a primary care physician if you do not have one) for your aftercare needs so that they can reassess your need for medications and monitor your lab values. You Must read complete instructions/literature along with all the possible adverse reactions/side effects for all the Medicines you take and that have been prescribed to you. Take any new Medicines after you have completely understood and accept all the possible adverse reactions/side effects. Wear Seat belts while driving. If you have smoked or chewed Tobacco in the last 2 yrs please stop smoking and/or stop any Recreational  drug use.   Increase activity slowly   Complete by:  As directed      Discharge Exam: Filed Weights   04/01/17 0510 04/02/17 0500 04/03/17 0609  Weight: 105.2 kg (231 lb 14.8 oz) 100.9 kg (222 lb 7.1 oz) 99 kg (218 lb 4.1 oz)   Vitals:   04/02/17 2017 04/03/17 0612  BP: 114/77 118/77  Pulse: 76 89  Resp: 16 16  Temp: 97.9 F (36.6 C) 98.6 F (37 C)  SpO2: 99% 97%   General: Appear in no distress, no Rash; Oral Mucosa moist. Cardiovascular: S1 and S2 Present, no Murmur, no JVD Respiratory: Bilateral Air entry present and Clear to Auscultation, no Crackles, no wheezes Abdomen: Bowel Sound present, Soft and distended, no tenderness, lateral scrotal swelling as well Extremities: bilateral  Pedal edema, no calf tenderness Neurology: Grossly no focal neuro deficit.  The results of significant diagnostics from this hospitalization (including imaging, microbiology,  ancillary and laboratory) are listed below for reference.    Significant Diagnostic Studies: Dg Chest 2 View  Result Date: 03/31/2017 CLINICAL DATA:  Short of breath EXAM: CHEST  2 VIEW COMPARISON:  01/04/2017 FINDINGS: Low lung volumes with elevation of the right diaphragm. Atelectasis in the right mid lung and right base. No focal consolidation or effusion. Cardiomediastinal silhouette within normal limits allowing for low lung volumes. Aortic atherosclerosis. No pneumothorax. IMPRESSION: Low lung volumes with right basilar atelectasis. Electronically Signed   By: Donavan Foil M.D.   On: 03/31/2017 17:45   US Paracentesis  Result Date: 04/01/2017 INDICATION: Hepatitis-C, cirrhosis, recurrent ascites. Request for diagnostic and therapeutic paracentesis up to 5 liters. EXAM: ULTRASOUND GUIDED RIGHT LATERAL ABDOMEN PARACENTESIS MEDICATIONS: 1% Lidocaine = 10 mL COMPLICATIONS: None immediate. PROCEDURE: Informed written consent was obtained from the patient after a discussion of the risks, benefits and alternatives to treatment. A timeout was performed prior to the initiation of the procedure. Initial ultrasound scanning demonstrates a large amount of ascites within the right lateral abdomen. The right lateral abdomen was prepped and draped in the usual sterile fashion. 1% lidocaine with epinephrine was used for local anesthesia. Following this, a 19 gauge, 7-cm, Yueh catheter was introduced. An ultrasound image was saved for documentation purposes. The paracentesis was performed. The catheter was removed and a dressing was applied. The patient tolerated the procedure well without immediate post procedural complication. FINDINGS: A total of approximately 5 liters of clear yellow fluid was removed. Samples were sent to the laboratory as requested by the clinical team. IMPRESSION: Successful ultrasound-guided paracentesis yielding 5 liters of peritoneal fluid. Read by: Gareth Eagle, PA-C Electronically Signed    By: Aletta Edouard M.D.   On: 04/01/2017 13:24    Microbiology: Recent Results (from the past 240 hour(s))  Culture, body fluid-bottle     Status: None (Preliminary result)   Collection Time: 04/01/17 11:14 AM  Result Value Ref Range Status   Specimen Description PERITONEAL  Final   Special Requests NONE  Final   Culture   Final    NO GROWTH 4 DAYS Performed at [facility], [address], [place], [place] [address]    Report Status PENDING  Incomplete  Gram stain     Status: None   Collection Time: 04/01/17 11:14 AM  Result Value Ref Range Status   Specimen Description PERITONEAL  Final   Special Requests NONE  Final   Gram Stain   Final    RARE WBC PRESENT, PREDOMINANTLY MONONUCLEAR NO ORGANISMS SEEN Performed at  [facility], [place] [address], [place], [place] [address]    Report Status 04/01/2017 FINAL  Final     Labs: CBC: Recent Labs  Lab 03/31/17 1714 04/02/17 0459 04/03/17 0518  WBC 4.1 4.1 4.4  NEUTROABS 2.3  --   --   HGB 11.9* 10.0* 10.7*  HCT 35.8* 30.1* 31.5*  MCV 102.3* 102.0* 100.0  PLT 157 127* 131*   Basic Metabolic Panel: Recent Labs  Lab 03/31/17 1714 04/01/17 0458 04/02/17 0459 04/03/17 0518  NA 134* 137 135 131*  K 4.2 3.8 4.1 3.5  CL 102 101 101 96*  CO2 26 28 29 28   GLUCOSE 154* 111* 94 86  BUN 12 12 11 11   CREATININE 1.04 1.05 1.16 1.04  CALCIUM 8.5* 8.2* 8.4* 8.4*  MG  --   --  1.9 1.6*   Liver Function Tests: Recent Labs  Lab 03/31/17 1714  AST 60*  ALT 21  ALKPHOS 87  BILITOT 2.8*  PROT 8.9*  ALBUMIN 2.5*   Recent Labs  Lab 03/31/17 1714  LIPASE 41   No results for input(s): AMMONIA in the last 168 hours. Cardiac Enzymes: No results for input(s): CKTOTAL, CKMB, CKMBINDEX, TROPONINI in the last 168 hours. BNP (last 3 results) Recent Labs    01/05/17 0035  BNP 105.2*   CBG: No results for input(s): GLUCAP in the last 168 hours. Time spent: 35 minutes  Signed:  Berle Mull  [facility] 04/03/2017 , 9:59 PM

## 2017-04-06 LAB — CULTURE, BODY FLUID W GRAM STAIN -BOTTLE: Culture: NO GROWTH

## 2017-04-06 LAB — CULTURE, BODY FLUID-BOTTLE

## 2017-04-21 ENCOUNTER — Other Ambulatory Visit: Payer: Self-pay | Admitting: Gastroenterology

## 2017-05-23 ENCOUNTER — Encounter (HOSPITAL_COMMUNITY): Admission: RE | Payer: Self-pay | Source: Ambulatory Visit

## 2017-05-23 ENCOUNTER — Ambulatory Visit (HOSPITAL_COMMUNITY): Admission: RE | Admit: 2017-05-23 | Payer: Medicare Other | Source: Ambulatory Visit | Admitting: Gastroenterology

## 2017-05-23 SURGERY — ESOPHAGOGASTRODUODENOSCOPY (EGD) WITH PROPOFOL
Anesthesia: Monitor Anesthesia Care

## 2017-06-09 ENCOUNTER — Other Ambulatory Visit: Payer: Self-pay | Admitting: Gastroenterology

## 2017-07-14 ENCOUNTER — Ambulatory Visit (HOSPITAL_COMMUNITY): Admit: 2017-07-14 | Payer: Medicare Other | Admitting: Gastroenterology

## 2017-07-14 ENCOUNTER — Encounter (HOSPITAL_COMMUNITY): Payer: Self-pay

## 2017-07-14 SURGERY — COLONOSCOPY WITH PROPOFOL
Anesthesia: Monitor Anesthesia Care

## 2017-08-09 ENCOUNTER — Other Ambulatory Visit: Payer: Self-pay

## 2017-08-09 ENCOUNTER — Emergency Department (HOSPITAL_COMMUNITY): Payer: Medicare Other

## 2017-08-09 ENCOUNTER — Encounter (HOSPITAL_COMMUNITY): Payer: Self-pay | Admitting: Emergency Medicine

## 2017-08-09 ENCOUNTER — Inpatient Hospital Stay (HOSPITAL_COMMUNITY)
Admission: EM | Admit: 2017-08-09 | Discharge: 2017-08-16 | DRG: 432 | Disposition: A | Discharge status: Home / Self Care | Payer: Medicare Other | Attending: Internal Medicine | Admitting: Internal Medicine

## 2017-08-09 DIAGNOSIS — R601 Generalized edema: Secondary | ICD-10-CM | POA: Diagnosis not present

## 2017-08-09 DIAGNOSIS — R000 Tachycardia, unspecified: Secondary | ICD-10-CM | POA: Diagnosis not present

## 2017-08-09 DIAGNOSIS — Z87891 Personal history of nicotine dependence: Secondary | ICD-10-CM | POA: Diagnosis not present

## 2017-08-09 DIAGNOSIS — E43 Unspecified severe protein-calorie malnutrition: Secondary | ICD-10-CM

## 2017-08-09 DIAGNOSIS — Z6841 Body Mass Index (BMI) 40.0 and over, adult: Secondary | ICD-10-CM | POA: Diagnosis not present

## 2017-08-09 DIAGNOSIS — K7290 Hepatic failure, unspecified without coma: Secondary | ICD-10-CM | POA: Diagnosis present

## 2017-08-09 DIAGNOSIS — R531 Weakness: Secondary | ICD-10-CM | POA: Diagnosis present

## 2017-08-09 DIAGNOSIS — Z91030 Bee allergy status: Secondary | ICD-10-CM

## 2017-08-09 DIAGNOSIS — K7460 Unspecified cirrhosis of liver: Secondary | ICD-10-CM | POA: Diagnosis present

## 2017-08-09 DIAGNOSIS — Z79899 Other long term (current) drug therapy: Secondary | ICD-10-CM

## 2017-08-09 DIAGNOSIS — K767 Hepatorenal syndrome: Secondary | ICD-10-CM | POA: Diagnosis present

## 2017-08-09 DIAGNOSIS — B182 Chronic viral hepatitis C: Secondary | ICD-10-CM | POA: Diagnosis present

## 2017-08-09 DIAGNOSIS — R140 Abdominal distension (gaseous): Secondary | ICD-10-CM | POA: Diagnosis present

## 2017-08-09 DIAGNOSIS — R5381 Other malaise: Secondary | ICD-10-CM | POA: Diagnosis present

## 2017-08-09 DIAGNOSIS — D61818 Other pancytopenia: Secondary | ICD-10-CM | POA: Diagnosis present

## 2017-08-09 DIAGNOSIS — N183 Chronic kidney disease, stage 3 (moderate): Secondary | ICD-10-CM | POA: Diagnosis present

## 2017-08-09 DIAGNOSIS — E8809 Other disorders of plasma-protein metabolism, not elsewhere classified: Secondary | ICD-10-CM | POA: Diagnosis present

## 2017-08-09 DIAGNOSIS — C220 Liver cell carcinoma: Secondary | ICD-10-CM | POA: Diagnosis present

## 2017-08-09 DIAGNOSIS — Z515 Encounter for palliative care: Secondary | ICD-10-CM | POA: Diagnosis not present

## 2017-08-09 DIAGNOSIS — I959 Hypotension, unspecified: Secondary | ICD-10-CM | POA: Diagnosis not present

## 2017-08-09 DIAGNOSIS — E871 Hypo-osmolality and hyponatremia: Secondary | ICD-10-CM | POA: Diagnosis present

## 2017-08-09 DIAGNOSIS — Z66 Do not resuscitate: Secondary | ICD-10-CM | POA: Diagnosis not present

## 2017-08-09 DIAGNOSIS — N179 Acute kidney failure, unspecified: Secondary | ICD-10-CM | POA: Diagnosis present

## 2017-08-09 DIAGNOSIS — Z9114 Patient's other noncompliance with medication regimen: Secondary | ICD-10-CM

## 2017-08-09 DIAGNOSIS — R0602 Shortness of breath: Secondary | ICD-10-CM | POA: Diagnosis present

## 2017-08-09 DIAGNOSIS — E8770 Fluid overload, unspecified: Secondary | ICD-10-CM | POA: Diagnosis present

## 2017-08-09 DIAGNOSIS — I129 Hypertensive chronic kidney disease with stage 1 through stage 4 chronic kidney disease, or unspecified chronic kidney disease: Secondary | ICD-10-CM | POA: Diagnosis present

## 2017-08-09 DIAGNOSIS — R188 Other ascites: Secondary | ICD-10-CM | POA: Diagnosis present

## 2017-08-09 DIAGNOSIS — Z7189 Other specified counseling: Secondary | ICD-10-CM | POA: Diagnosis not present

## 2017-08-09 DIAGNOSIS — Z9119 Patient's noncompliance with other medical treatment and regimen: Secondary | ICD-10-CM

## 2017-08-09 DIAGNOSIS — R600 Localized edema: Secondary | ICD-10-CM

## 2017-08-09 LAB — COMPREHENSIVE METABOLIC PANEL
ALT: 23 U/L (ref 0–44)
AST: 67 U/L — ABNORMAL HIGH (ref 15–41)
Albumin: 3.5 g/dL (ref 3.5–5.0)
Alkaline Phosphatase: 60 U/L (ref 38–126)
Anion gap: 7 (ref 5–15)
BUN: 25 mg/dL — ABNORMAL HIGH (ref 8–23)
CO2: 26 mmol/L (ref 22–32)
Calcium: 9.1 mg/dL (ref 8.9–10.3)
Chloride: 102 mmol/L (ref 98–111)
Creatinine, Ser: 1.82 mg/dL — ABNORMAL HIGH (ref 0.61–1.24)
GFR calc Af Amer: 44 mL/min — ABNORMAL LOW (ref >60)
GFR calc non Af Amer: 38 mL/min — ABNORMAL LOW (ref >60)
Glucose, Bld: 100 mg/dL — ABNORMAL HIGH (ref 70–99)
Potassium: 4.9 mmol/L (ref 3.5–5.1)
Sodium: 135 mmol/L (ref 135–145)
Total Bilirubin: 4.2 mg/dL — ABNORMAL HIGH (ref 0.3–1.2)
Total Protein: 8.6 g/dL — ABNORMAL HIGH (ref 6.5–8.1)

## 2017-08-09 LAB — CBC WITH DIFFERENTIAL/PLATELET
Basophils Absolute: 0.1 10*3/uL (ref 0.0–0.1)
Basophils Relative: 2 %
Eosinophils Absolute: 0.2 10*3/uL (ref 0.0–0.7)
Eosinophils Relative: 5 %
HCT: 32.2 % — ABNORMAL LOW (ref 39.0–52.0)
Hemoglobin: 11.2 g/dL — ABNORMAL LOW (ref 13.0–17.0)
Lymphocytes Relative: 24 %
Lymphs Abs: 0.7 10*3/uL (ref 0.7–4.0)
MCH: 34.4 pg — ABNORMAL HIGH (ref 26.0–34.0)
MCHC: 34.8 g/dL (ref 30.0–36.0)
MCV: 98.8 fL (ref 78.0–100.0)
Monocytes Absolute: 0.4 10*3/uL (ref 0.1–1.0)
Monocytes Relative: 12 %
Neutro Abs: 1.7 10*3/uL (ref 1.7–7.7)
Neutrophils Relative %: 57 %
Platelets: 122 10*3/uL — ABNORMAL LOW (ref 150–400)
RBC: 3.26 MIL/uL — ABNORMAL LOW (ref 4.22–5.81)
RDW: 14.8 % (ref 11.5–15.5)
WBC: 3.1 10*3/uL — ABNORMAL LOW (ref 4.0–10.5)

## 2017-08-09 LAB — PROTIME-INR
INR: 1.71
Prothrombin Time: 19.9 seconds — ABNORMAL HIGH (ref 11.4–15.2)

## 2017-08-09 LAB — LIPASE, BLOOD: Lipase: 48 U/L (ref 11–51)

## 2017-08-09 MED ORDER — ACETAMINOPHEN 650 MG RE SUPP: 650.0000 mg | Freq: Four times a day (QID) | RECTAL | Status: DC | PRN

## 2017-08-09 MED ORDER — ENSURE ENLIVE PO LIQD: 237.0000 mL | Freq: Two times a day (BID) | ORAL | Status: DC

## 2017-08-09 MED ORDER — ALBUMIN HUMAN 25 % IV SOLN
12.5000 g | Freq: Four times a day (QID) | INTRAVENOUS | Status: AC
  Administered 2017-08-09 – 2017-08-11 (×6): 12.5 g via INTRAVENOUS
  Filled 2017-08-09 (×6): qty 50

## 2017-08-09 MED ORDER — ONDANSETRON HCL 4 MG/2ML IJ SOLN: 4.0000 mg | Freq: Four times a day (QID) | INTRAMUSCULAR | Status: DC | PRN

## 2017-08-09 MED ORDER — ONDANSETRON HCL 4 MG PO TABS: 4.0000 mg | ORAL_TABLET | Freq: Four times a day (QID) | ORAL | Status: DC | PRN

## 2017-08-09 MED ORDER — ACETAMINOPHEN 325 MG PO TABS: 650.0000 mg | ORAL_TABLET | Freq: Four times a day (QID) | ORAL | Status: DC | PRN

## 2017-08-09 MED ORDER — OXYCODONE HCL 5 MG PO TABS
5.0000 mg | ORAL_TABLET | Freq: Four times a day (QID) | ORAL | Status: DC | PRN
  Administered 2017-08-09 – 2017-08-11 (×4): 5 mg via ORAL
  Filled 2017-08-09 (×4): qty 1

## 2017-08-09 NOTE — ED Triage Notes (Signed)
Patient c/o SOB r/t abdominal distention. Last paracentesis in June.

## 2017-08-09 NOTE — H&P (Signed)
History and Physical    Jerry Mcintyre:[id number] DOB: 1955/04/04 DOA: 08/09/2017  PCP: Harlan Stains, MD  Patient coming from: Home.  Chief Complaint: Abdominal distention.  HPI: Jerry Mcintyre is a 62 y.o. male with history of hepatitis C with cirrhosis who has had recent embolization on Jun 16, 2017 at [facility] for hepatocellular carcinoma presents to the ER with increasing abdominal distention shortness of breath and peripheral edema gradually developing over the last 3 weeks.  Patient states he stopped taking all his diuretics last 3 weeks ago since he thought it was not helping him.  Has abdominal distention but denies any acute pain or any nausea vomiting or diarrhea.  Denies any fever chills.  ED Course: In the ER on exam patient has significant abdominal distention.  Chest x-ray does not show anything acute.  Labs reveal anemia thrombocytopenia which appears to be chronic.  Patient also has a creatinine of 1.8 and creatinine in May 2019 at [facility] was around 1.5.  Patient admitted for anasarca with decompensated cirrhosis.  Review of Systems: As per HPI, rest all negative.   Past Medical History:  Diagnosis Date  . Hernia, abdominal   . Hypertension   . 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 DENTAL SURGERY    . teeth removal       reports that he quit smoking about 9 months ago. He has never used smokeless tobacco. He reports that he drinks alcohol. He reports that he does not use drugs.  Allergies  Allergen Reactions  . Bee Venom     Family History  Problem Relation Age of Onset  . Stroke Mother   . Hypertension Mother     Prior to Admission medications   Medication Sig Start Date End Date Taking? Authorizing Provider  lactose free nutrition (BOOST) LIQD Take 237 mLs by mouth 3 (three) times daily between meals.   Yes [provider]  furosemide (LASIX) 40 MG tablet Take 1 tablet (40 mg  total) by mouth 2 (two) times daily. For 1 week, start 40 mg daily from 04/10/2017 Patient not taking: Reported on 08/09/2017 04/03/17   Lavina Hamman, MD  methocarbamol (ROBAXIN) 500 MG tablet Take 1 tablet (500 mg total) by mouth every 8 (eight) hours as needed for muscle spasms. Patient not taking: Reported on 08/09/2017 04/03/17   Lavina Hamman, MD  oxyCODONE (OXY IR/ROXICODONE) 5 MG immediate release tablet TAKE 1 TABLET (5 MG TOTAL) BY MOUTH EVERY 3 (THREE) HOURS AS NEEDED FOR PAIN 06/18/17   [provider]  pantoprazole (PROTONIX) 40 MG tablet Take 1 tablet (40 mg total) by mouth daily. Patient not taking: Reported on 08/09/2017 04/03/17   Lavina Hamman, MD  potassium chloride (KLOR-CON) 20 MEQ packet Take 20 mEq by mouth daily. Patient not taking: Reported on 08/09/2017 01/07/17   Rosita Fire, MD  spironolactone (ALDACTONE) 100 MG tablet Take 2 tablets (200 mg total) by mouth daily. Patient not taking: Reported on 08/09/2017 04/03/17   Lavina Hamman, MD    Physical Exam: Vitals:   08/09/17 1857 08/09/17 2148 08/09/17 2227 08/09/17 2229  BP: 133/82 (!) 120/100 (!) 139/93 (!) 139/93  Pulse: 76 90 80 80  Resp: (!) 23 (!) 26 (!) 22 (!) 22  Temp:    97.6 F (36.4 C)  TempSrc:    Oral  SpO2: 100% 97% 97%   Weight:  88.7 kg (195 lb 8 oz)  Height:    5' 9.5" (1.765 m)      Constitutional: Moderately built and poorly nourished. Vitals:   08/09/17 1857 08/09/17 2148 08/09/17 2227 08/09/17 2229  BP: 133/82 (!) 120/100 (!) 139/93 (!) 139/93  Pulse: 76 90 80 80  Resp: (!) 23 (!) 26 (!) 22 (!) 22  Temp:    97.6 F (36.4 C)  TempSrc:    Oral  SpO2: 100% 97% 97%   Weight:    88.7 kg (195 lb 8 oz)  Height:    5' 9.5" (1.765 m)   Eyes: Anicteric no pallor. ENMT: No discharge from the ears eyes nose or mouth. Neck: No mass felt.  No neck rigidity no JVD appreciated. Respiratory: No rhonchi or crepitations. Cardiovascular: S1-S2 heard no murmurs appreciated. Abdomen: Soft  distended nontender bowel sounds present. Musculoskeletal: Bilateral lower extremity edema. Skin: No rash. Neurologic: Alert awake oriented to time place and person.  Moves all extremities. Psychiatric: Appears normal per normal affect.   Labs on Admission: I have personally reviewed following labs and imaging studies  CBC: Recent Labs  Lab 08/09/17 1836  WBC 3.1*  NEUTROABS 1.7  HGB 11.2*  HCT 32.2*  MCV 98.8  PLT 027*   Basic Metabolic Panel: Recent Labs  Lab 08/09/17 1836  NA 135  K 4.9  CL 102  CO2 26  GLUCOSE 100*  BUN 25*  CREATININE 1.82*  CALCIUM 9.1   GFR: Estimated Creatinine Clearance: 46.2 mL/min (A) (by C-G formula based on SCr of 1.82 mg/dL (H)). Liver Function Tests: Recent Labs  Lab 08/09/17 1836  AST 67*  ALT 23  ALKPHOS 60  BILITOT 4.2*  PROT 8.6*  ALBUMIN 3.5   Recent Labs  Lab 08/09/17 1836  LIPASE 48   No results for input(s): AMMONIA in the last 168 hours. Coagulation Profile: Recent Labs  Lab 08/09/17 1836  INR 1.71   Cardiac Enzymes: No results for input(s): CKTOTAL, CKMB, CKMBINDEX, TROPONINI in the last 168 hours. BNP (last 3 results) No results for input(s): PROBNP in the last 8760 hours. HbA1C: No results for input(s): HGBA1C in the last 72 hours. CBG: No results for input(s): GLUCAP in the last 168 hours. Lipid Profile: No results for input(s): CHOL, HDL, LDLCALC, TRIG, CHOLHDL, LDLDIRECT in the last 72 hours. Thyroid Function Tests: No results for input(s): TSH, T4TOTAL, FREET4, T3FREE, THYROIDAB in the last 72 hours. Anemia Panel: No results for input(s): VITAMINB12, FOLATE, FERRITIN, TIBC, IRON, RETICCTPCT in the last 72 hours. Urine analysis:    Component Value Date/Time   COLORURINE AMBER (A) 01/05/2017 0631   APPEARANCEUR CLEAR 01/05/2017 0631   LABSPEC 1.028 01/05/2017 0631   PHURINE 5.0 01/05/2017 0631   GLUCOSEU NEGATIVE 01/05/2017 0631   HGBUR NEGATIVE 01/05/2017 0631   BILIRUBINUR SMALL (A)  01/05/2017 0631   KETONESUR NEGATIVE 01/05/2017 0631   PROTEINUR 30 (A) 01/05/2017 0631   NITRITE NEGATIVE 01/05/2017 0631   LEUKOCYTESUR NEGATIVE 01/05/2017 0631   Sepsis Labs: @LABRCNTIP (procalcitonin:4,lacticidven:4) )No results found for this or any previous visit (from the past 240 hour(s)).   Radiological Exams on Admission: Dg Chest 2 View  Result Date: 08/09/2017 CLINICAL DATA:  Dyspnea and abdominal distention. EXAM: CHEST - 2 VIEW COMPARISON:  None. FINDINGS: AP lordotic semi upright view of the chest demonstrates low lung volumes with right basilar atelectasis and/or scarring. Chronic mild elevation of the right hemidiaphragm. No pulmonary consolidation, effusion or pneumothorax. Mild aortic atherosclerosis. Normal size cardiac silhouette.  No acute osseous abnormality. IMPRESSION: Low lung volumes with right basilar atelectasis and/or scarring. Aortic atherosclerosis. No active pulmonary disease. Electronically Signed   By: Ashley Royalty M.D.   On: 08/09/2017 18:58    EKG: Independently reviewed.  Normal sinus rhythm.  Assessment/Plan Active Problems:   Anasarca   Chronic hepatitis C without hepatic coma ([facility])   Decompensated hepatic cirrhosis ([facility])   Bilateral lower extremity edema   ARF (acute renal failure) ([place])    1. Decompensated liver cirrhosis with large ascites and anasarca -I have placed patient on IV albumin infusion.  Will order ultrasound-guided paracentesis in the morning.  Closely follow respiratory status and renal function.  After which if patient's clinical status and renal functions are low we will restart patient's spironolactone and Lasix.  Closely follow intake output. 2. Chronic kidney disease stage II with mild worsening of creatinine likely hepatorenal syndrome presently receiving albumin infusion.  Follow metabolic panel intake output.  May need to restart Lasix and spironolactone based on clinical status and renal function. 3. Hepatocellular carcinoma  status post recent embolization in Jun 16, 2017 at [facility].  Being followed by transplant hepatologist. 4. History of tachycardia being evaluated at [facility] and had a Holter monitor recently. 5. Anemia and thrombocytopenia appears to be chronic likely from cirrhosis. 6. History of cirrhosis secondary to hepatitis C.   DVT prophylaxis: SCDs. Code Status: Full code. Family Communication: Discussed with patient. Disposition Plan: Home. Consults called: None. Admission status: Inpatient.   Rise Patience MD [facility] Pager [phone].  If 7PM-7AM, please contact night-coverage [email or web address] Password [email or web address]  08/09/2017, 10:35 PM

## 2017-08-09 NOTE — ED Notes (Signed)
Pt. Made aware for the need of urine specimen. 

## 2017-08-09 NOTE — ED Notes (Signed)
Bed: PG98 Expected date:  Expected time:  Means of arrival:  Comments: [place] Done

## 2017-08-09 NOTE — ED Provider Notes (Signed)
[place] DEPT Provider Note   CSN: [id number] Arrival date & time: 08/09/17  1704     History   Chief Complaint Chief Complaint  Patient presents with  . Ascites    HPI Jerry Mcintyre is a 62 y.o. male with history of hypertension, hepatitis C, thrombocytopenia, cirrhosis of the liver with ascites presents for evaluation of acute onset, gradually worsening abdominal distention and bilateral lower extremity edema for the past 2 weeks.  States that at this point he is extremely short of breath secondary to his abdominal distention.  He notes dyspnea on exertion, orthopnea, and PND.  He denies any chest pain but has some chest tightness sometimes when he is working hard to breathe.  He notes generalized abdominal pain which does not radiate.  He states that when these symptoms occur he typically comes to the ED for paracentesis, last occurred in May at an outside hospital.  He denies fevers, nausea, vomiting, diarrhea, constipation, melena, or hematochezia.  The history is provided by the patient.    Past Medical History:  Diagnosis Date  . Hernia, abdominal   . Hypertension   . 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 Decompensated hepatic cirrhosis ([place]) 08/09/2017  . Bilateral lower extremity edema 08/09/2017  . ARF (acute renal failure) ([place]) 08/09/2017  . Fluid overload 03/31/2017  . Chronic hepatitis C without hepatic coma ([place])   . Thrombocytopenia ([place]) 01/05/2017  . Hemoptysis 01/05/2017  . Abdominal pain 01/05/2017  . Abdominal distention 01/05/2017  . Hypokalemia 01/05/2017  . Abnormal liver function 01/05/2017  . Ascites 01/05/2017  . Anasarca 01/05/2017  . Abdominal distension   . Cirrhosis of liver with ascites [facility])     Past Surgical History:  Procedure Laterality Date  . DENTAL SURGERY    . teeth removal          Home Medications    Prior to Admission medications     Medication Sig Start Date End Date Taking? Authorizing Provider  lactose free nutrition (BOOST) LIQD Take 237 mLs by mouth 3 (three) times daily between meals.   Yes [provider]  furosemide (LASIX) 40 MG tablet Take 1 tablet (40 mg total) by mouth 2 (two) times daily. For 1 week, start 40 mg daily from 04/10/2017 Patient not taking: Reported on 08/09/2017 04/03/17   Lavina Hamman, MD  methocarbamol (ROBAXIN) 500 MG tablet Take 1 tablet (500 mg total) by mouth every 8 (eight) hours as needed for muscle spasms. Patient not taking: Reported on 08/09/2017 04/03/17   Lavina Hamman, MD  oxyCODONE (OXY IR/ROXICODONE) 5 MG immediate release tablet TAKE 1 TABLET (5 MG TOTAL) BY MOUTH EVERY 3 (THREE) HOURS AS NEEDED FOR PAIN 06/18/17   [provider]  pantoprazole (PROTONIX) 40 MG tablet Take 1 tablet (40 mg total) by mouth daily. Patient not taking: Reported on 08/09/2017 04/03/17   Lavina Hamman, MD  potassium chloride (KLOR-CON) 20 MEQ packet Take 20 mEq by mouth daily. Patient not taking: Reported on 08/09/2017 01/07/17   Rosita Fire, MD  spironolactone (ALDACTONE) 100 MG tablet Take 2 tablets (200 mg total) by mouth daily. Patient not taking: Reported on 08/09/2017 04/03/17   Lavina Hamman, MD    Family History Family History  Problem Relation Age of Onset  . Stroke Mother   . Hypertension Mother     Social History Social History   Tobacco  Use  . Smoking status: Former Smoker    Last attempt to quit: 11/01/2016    Years since quitting: 0.7  . Smokeless tobacco: Never Used  Substance Use Topics  . Alcohol use: Yes    Comment: "drink a beer in the summertime"  . Drug use: No     Allergies   Bee venom   Review of Systems Review of Systems  Constitutional: Negative for chills and fever.  Respiratory: Positive for chest tightness and shortness of breath.   Cardiovascular: Positive for leg swelling. Negative for chest pain.  Gastrointestinal: Positive for  abdominal distention and abdominal pain. Negative for constipation, diarrhea, nausea and vomiting.  All other systems reviewed and are negative.    Physical Exam Updated Vital Signs BP (!) 139/93   Pulse 80   Temp 97.6 F (36.4 C) (Oral)   Resp (!) 22   Ht 5' 9.5" (1.765 m)   Wt 88.7 kg (195 lb 8 oz)   SpO2 97%   BMI 28.46 kg/m   Physical Exam  Constitutional: He appears well-developed and well-nourished. No distress.  HENT:  Head: Normocephalic and atraumatic.  Eyes: Conjunctivae are normal. Right eye exhibits no discharge. Left eye exhibits no discharge.  Neck: Normal range of motion. No JVD present. No tracheal deviation present.  Cardiovascular: Normal rate, regular rhythm and normal heart sounds.  3+ pitting edema of the bilateral lower extremities.  2+ radial and DP/PT pulses bilaterally  Pulmonary/Chest:  Tachypneic, bibasilar crackles noted on auscultation of the lungs  Abdominal: Bowel sounds are normal. He exhibits distension. There is tenderness.  Significant distention of the abdomen with diffuse tenderness to palpation, no focal tenderness.  Positive fluid wave.  Musculoskeletal: He exhibits no edema.  Neurological: He is alert.  Skin: Skin is warm and dry. No erythema.  Psychiatric: He has a normal mood and affect. His behavior is normal.  Nursing note and vitals reviewed.    ED Treatments / Results  Labs (all labs ordered are listed, but only abnormal results are displayed) Labs Reviewed  CBC WITH DIFFERENTIAL/PLATELET - Abnormal; Notable for the following components:      Result Value   WBC 3.1 (*)    RBC 3.26 (*)    Hemoglobin 11.2 (*)    HCT 32.2 (*)    MCH 34.4 (*)    Platelets 122 (*)    All other components within normal limits  COMPREHENSIVE METABOLIC PANEL - Abnormal; Notable for the following components:   Glucose, Bld 100 (*)    BUN 25 (*)    Creatinine, Ser 1.82 (*)    Total Protein 8.6 (*)    AST 67 (*)    Total Bilirubin 4.2 (*)     GFR calc non Af Amer 38 (*)    GFR calc Af Amer 44 (*)    All other components within normal limits  PROTIME-INR - Abnormal; Notable for the following components:   Prothrombin Time 19.9 (*)    All other components within normal limits  LIPASE, BLOOD  URINALYSIS, ROUTINE W REFLEX MICROSCOPIC  BASIC METABOLIC PANEL  CBC  HEPATIC FUNCTION PANEL    EKG None  Radiology Dg Chest 2 View  Result Date: 08/09/2017 CLINICAL DATA:  Dyspnea and abdominal distention. EXAM: CHEST - 2 VIEW COMPARISON:  None. FINDINGS: AP lordotic semi upright view of the chest demonstrates low lung volumes with right basilar atelectasis and/or scarring. Chronic mild elevation of the right hemidiaphragm. No pulmonary consolidation, effusion or pneumothorax. Mild aortic  atherosclerosis. Normal size cardiac silhouette. No acute osseous abnormality. IMPRESSION: Low lung volumes with right basilar atelectasis and/or scarring. Aortic atherosclerosis. No active pulmonary disease. Electronically Signed   By: Ashley Royalty M.D.   On: 08/09/2017 18:58    Procedures Procedures (including critical care time)  Medications Ordered in ED Medications  oxyCODONE (Oxy IR/ROXICODONE) immediate release tablet 5 mg (5 mg Oral Given 08/09/17 2313)  acetaminophen (TYLENOL) tablet 650 mg (has no administration in time range)    Or  acetaminophen (TYLENOL) suppository 650 mg (has no administration in time range)  ondansetron (ZOFRAN) tablet 4 mg (has no administration in time range)    Or  ondansetron (ZOFRAN) injection 4 mg (has no administration in time range)  albumin human 25 % solution 12.5 g (12.5 g Intravenous New Bag/Given 08/09/17 2310)  feeding supplement (ENSURE ENLIVE) (ENSURE ENLIVE) liquid 237 mL (has no administration in time range)     Initial Impression / Assessment and Plan / ED Course  I have reviewed the triage vital signs and the nursing notes.  Pertinent labs & imaging results that were available during my care of  the patient were reviewed by me and considered in my medical decision making (see chart for details).     Patient presents with significant ascites and bilateral lower extremity edema worsening over the past 2 weeks.  He is afebrile, somewhat tachypneic while in the ED.  Does appear to be uncomfortable secondary to volume overload.  Chest x-ray shows low lung volumes with right basilar atelectasis, no evidence of pleural effusion or pneumonia.  Lab work shows AKI with creatinine 1.82 up from baseline of around 1.  BUN is also acutely elevated.  He has a stable anemia, stable thrombocytopenia.  Will require admission for paracentesis and management of AKI. 9:03 PM Spoke with Dr. Hal Hope with [facility] who agrees to assume care patient bring him into the hospital for further evaluation and management. Final Clinical Impressions(s) / ED Diagnoses   Final diagnoses:  Other ascites  Bilateral lower extremity edema    ED Discharge Orders    None       Debroah Baller 08/09/17 2340    Lacretia Leigh, MD 08/10/17 1415

## 2017-08-09 NOTE — ED Notes (Addendum)
Pt aware that urine sample is needed.  

## 2017-08-09 NOTE — ED Notes (Signed)
Pt allowed to eat per Zenia Resides, MD

## 2017-08-10 ENCOUNTER — Inpatient Hospital Stay (HOSPITAL_COMMUNITY): Payer: Medicare Other

## 2017-08-10 DIAGNOSIS — R140 Abdominal distension (gaseous): Secondary | ICD-10-CM

## 2017-08-10 DIAGNOSIS — E43 Unspecified severe protein-calorie malnutrition: Secondary | ICD-10-CM

## 2017-08-10 DIAGNOSIS — R600 Localized edema: Secondary | ICD-10-CM

## 2017-08-10 DIAGNOSIS — B182 Chronic viral hepatitis C: Secondary | ICD-10-CM

## 2017-08-10 LAB — BASIC METABOLIC PANEL
Anion gap: 7 (ref 5–15)
BUN: 25 mg/dL — ABNORMAL HIGH (ref 8–23)
CALCIUM: 8.7 mg/dL — AB (ref 8.9–10.3)
CO2: 25 mmol/L (ref 22–32)
CREATININE: 1.78 mg/dL — AB (ref 0.61–1.24)
Chloride: 99 mmol/L (ref 98–111)
GFR calc Af Amer: 45 mL/min — ABNORMAL LOW (ref >60)
GFR calc non Af Amer: 39 mL/min — ABNORMAL LOW (ref >60)
Glucose, Bld: 111 mg/dL — ABNORMAL HIGH (ref 70–99)
Potassium: 4.5 mmol/L (ref 3.5–5.1)
Sodium: 131 mmol/L — ABNORMAL LOW (ref 135–145)

## 2017-08-10 LAB — CBC
HCT: 30 % — ABNORMAL LOW (ref 39.0–52.0)
Hemoglobin: 10.3 g/dL — ABNORMAL LOW (ref 13.0–17.0)
MCH: 33.9 pg (ref 26.0–34.0)
MCHC: 34.3 g/dL (ref 30.0–36.0)
MCV: 98.7 fL (ref 78.0–100.0)
PLATELETS: 113 10*3/uL — AB (ref 150–400)
RBC: 3.04 MIL/uL — AB (ref 4.22–5.81)
RDW: 15 % (ref 11.5–15.5)
WBC: 3.3 10*3/uL — ABNORMAL LOW (ref 4.0–10.5)

## 2017-08-10 LAB — GRAM STAIN

## 2017-08-10 LAB — HEPATIC FUNCTION PANEL
ALT: 23 U/L (ref 0–44)
AST: 59 U/L — ABNORMAL HIGH (ref 15–41)
Albumin: 3.4 g/dL — ABNORMAL LOW (ref 3.5–5.0)
Alkaline Phosphatase: 56 U/L (ref 38–126)
Bilirubin, Direct: 1.5 mg/dL — ABNORMAL HIGH (ref 0.0–0.2)
Indirect Bilirubin: 2.5 mg/dL — ABNORMAL HIGH (ref 0.3–0.9)
TOTAL PROTEIN: 8 g/dL (ref 6.5–8.1)
Total Bilirubin: 4 mg/dL — ABNORMAL HIGH (ref 0.3–1.2)

## 2017-08-10 LAB — URINALYSIS, ROUTINE W REFLEX MICROSCOPIC
Bilirubin Urine: NEGATIVE
Glucose, UA: NEGATIVE mg/dL
Hgb urine dipstick: NEGATIVE
Ketones, ur: NEGATIVE mg/dL
LEUKOCYTES UA: NEGATIVE
NITRITE: NEGATIVE
PH: 5 (ref 5.0–8.0)
Protein, ur: NEGATIVE mg/dL
SPECIFIC GRAVITY, URINE: 1.02 (ref 1.005–1.030)

## 2017-08-10 LAB — SODIUM, URINE, RANDOM

## 2017-08-10 LAB — LACTATE DEHYDROGENASE, PLEURAL OR PERITONEAL FLUID: LD FL: 61 U/L — AB (ref 3–23)

## 2017-08-10 LAB — ALBUMIN, PLEURAL OR PERITONEAL FLUID: Albumin, Fluid: 1.6 g/dL

## 2017-08-10 LAB — SAVE SMEAR

## 2017-08-10 MED ORDER — LIDOCAINE HCL 1 % IJ SOLN
INTRAMUSCULAR | Status: AC
  Filled 2017-08-10: qty 10

## 2017-08-10 MED ORDER — SPIRONOLACTONE 12.5 MG HALF TABLET
12.5000 mg | ORAL_TABLET | Freq: Every day | ORAL | Status: DC
  Administered 2017-08-10 – 2017-08-12 (×3): 12.5 mg via ORAL
  Filled 2017-08-10 (×3): qty 1

## 2017-08-10 MED ORDER — BOOST PO LIQD
237.0000 mL | Freq: Three times a day (TID) | ORAL | Status: DC
  Administered 2017-08-10 – 2017-08-15 (×7): 237 mL via ORAL
  Filled 2017-08-10 (×20): qty 237

## 2017-08-10 MED ORDER — PANTOPRAZOLE SODIUM 40 MG PO TBEC
40.0000 mg | DELAYED_RELEASE_TABLET | Freq: Every day | ORAL | Status: DC
  Administered 2017-08-10 – 2017-08-16 (×7): 40 mg via ORAL
  Filled 2017-08-10 (×7): qty 1

## 2017-08-10 MED ORDER — SODIUM CHLORIDE 0.9 % IV SOLN
2.0000 g | INTRAVENOUS | Status: DC
  Administered 2017-08-10 – 2017-08-15 (×6): 2 g via INTRAVENOUS
  Filled 2017-08-10 (×6): qty 2

## 2017-08-10 MED ORDER — LACTULOSE 10 GM/15ML PO SOLN
10.0000 g | Freq: Every day | ORAL | Status: DC
  Administered 2017-08-10: 10 g via ORAL
  Filled 2017-08-10 (×2): qty 15

## 2017-08-10 MED ORDER — CEFTRIAXONE SODIUM IN DEXTROSE 40 MG/ML IV SOLN
2.0000 g | INTRAVENOUS | Status: DC
  Filled 2017-08-10: qty 50

## 2017-08-10 MED ORDER — SPIRONOLACTONE 25 MG PO TABS: 200.0000 mg | ORAL_TABLET | Freq: Every day | ORAL | Status: DC

## 2017-08-10 MED ORDER — BOOST PLUS PO LIQD
237.0000 mL | Freq: Two times a day (BID) | ORAL | Status: DC
  Administered 2017-08-10: 237 mL via ORAL
  Filled 2017-08-10 (×2): qty 237

## 2017-08-10 NOTE — Progress Notes (Signed)
PROGRESS NOTE  Jerry Mcintyre XVQ:[id number] DOB: October 08, 1955 DOA: 08/09/2017 PCP: No primary care provider on file.  HPI/Recap of past 24 hours: Jerry Mcintyre is a 62 year old male with past medical history significant for advanced cirrhosis, hepatocellular carcinoma who presented to the ED with increasing abdominal distention and shortness of breath.  Found to be in anasarca with severe ascites.  08/10/2017: Patient seen and examined at his bedside.  He revealed last paracentesis was on May 13th 2019 at [facility].  Has been followed in the past by Dr. Alessandra Bevels who has been consulted and will see him during this admission.  Reports dyspnea at rest due to abdominal distention.  Paracentesis done this morning with 8 L of fluid removed by interventional radiology.  Fluid analysis pending.  Assessment/Plan: Active Problems:   Anasarca   Chronic hepatitis C without hepatic coma (HCC)   Decompensated hepatic cirrhosis (HCC)   Bilateral lower extremity edema   ARF (acute renal failure) (HCC)  Decompensated liver cirrhosis with large ascites and anasarca Post paracentesis on 08/10/2017 by interventional radiology with 8 L of fluid removed Continue IV albumin Restart spironolactone Monitor blood pressure GI consulted Dr. Alessandra Bevels who has seen the patient in the past and will see during this admission  Advanced liver cirrhosis with concern for esophageal varices IV ceftriaxone started empirically Continue PPI May need a repeat EGD- Defer to GI to decide  Hyperbilirubinemia Indirect bilirubin 2.5 Direct bilirubin 1.5 Total bilirubin 4.0 Obtain peripheral smear  Chronic thrombocytopenia Platelets 113 K No sign of overt bleeding Repeat CBC in the morning  Pancytopenia Could be contributed by hemodilution WBC 3.3 Hemoglobin 10.3 Platelets 113 Repeat CBC in the morning Peripheral smear pending  Hypervolemic hyponatremia Sodium 131 Resume diuretics Repeat BMP in the morning Monitor  urine output  Anasarca Most likely secondary to hypoalbuminemia in the setting of advanced liver failure Resume diuretics  Noncompliance with medical management Patient needs reinforcement      Code Status: Full code  Family Communication: None at bedside  Disposition Plan: Home when clinically stable in 1 to 2 days   Consultants:  GI  Procedures:  Paracentesis done on 08/10/2017  Antimicrobials:  IV ceftriaxone  DVT prophylaxis: SCDs   Objective: Vitals:   08/10/17 1035 08/10/17 1042 08/10/17 1100 08/10/17 1239  BP: 125/85 116/75 107/69 106/72  Pulse:    79  Resp:    20  Temp:    97.6 F (36.4 C)  TempSrc:    Oral  SpO2:    96%  Weight:      Height:        Intake/Output Summary (Last 24 hours) at 08/10/2017 1442 Last data filed at 08/10/2017 1239 Gross per 24 hour  Intake 60 ml  Output 120 ml  Net -60 ml   Filed Weights   08/09/17 2229  Weight: 88.7 kg (195 lb 8 oz)    Exam:  . General: 62 y.o. year-old male well developed well nourished in no acute distress.  Alert and oriented x3. . Cardiovascular: Regular rate and rhythm with no rubs or gallops.  No thyromegaly or JVD noted.   Marland Kitchen Respiratory: Clear to auscultation with no wheezes or rales. Good inspiratory effort. . Abdomen: Severely distended with hypoactive bowel sounds x4. . Musculoskeletal: 1+ pitting edema in lower extremities bilaterally. Marland Kitchen Psychiatry: Mood is appropriate for condition and setting   Data Reviewed: CBC: Recent Labs  Lab 08/09/17 1836 08/10/17 0506  WBC 3.1* 3.3*  NEUTROABS 1.7  --  HGB 11.2* 10.3*  HCT 32.2* 30.0*  MCV 98.8 98.7  PLT 122* 474*   Basic Metabolic Panel: Recent Labs  Lab 08/09/17 1836 08/10/17 0506  NA 135 131*  K 4.9 4.5  CL 102 99  CO2 26 25  GLUCOSE 100* 111*  BUN 25* 25*  CREATININE 1.82* 1.78*  CALCIUM 9.1 8.7*   GFR: Estimated Creatinine Clearance: 47.2 mL/min (A) (by C-G formula based on SCr of 1.78 mg/dL (H)). Liver  Function Tests: Recent Labs  Lab 08/09/17 1836 08/10/17 0506  AST 67* 59*  ALT 23 23  ALKPHOS 60 56  BILITOT 4.2* 4.0*  PROT 8.6* 8.0  ALBUMIN 3.5 3.4*   Recent Labs  Lab 08/09/17 1836  LIPASE 48   No results for input(s): AMMONIA in the last 168 hours. Coagulation Profile: Recent Labs  Lab 08/09/17 1836  INR 1.71   Cardiac Enzymes: No results for input(s): CKTOTAL, CKMB, CKMBINDEX, TROPONINI in the last 168 hours. BNP (last 3 results) No results for input(s): PROBNP in the last 8760 hours. HbA1C: No results for input(s): HGBA1C in the last 72 hours. CBG: No results for input(s): GLUCAP in the last 168 hours. Lipid Profile: No results for input(s): CHOL, HDL, LDLCALC, TRIG, CHOLHDL, LDLDIRECT in the last 72 hours. Thyroid Function Tests: No results for input(s): TSH, T4TOTAL, FREET4, T3FREE, THYROIDAB in the last 72 hours. Anemia Panel: No results for input(s): VITAMINB12, FOLATE, FERRITIN, TIBC, IRON, RETICCTPCT in the last 72 hours. Urine analysis:    Component Value Date/Time   COLORURINE AMBER (A) 08/10/2017 0524   APPEARANCEUR CLEAR 08/10/2017 0524   LABSPEC 1.020 08/10/2017 0524   PHURINE 5.0 08/10/2017 0524   GLUCOSEU NEGATIVE 08/10/2017 0524   HGBUR NEGATIVE 08/10/2017 0524   BILIRUBINUR NEGATIVE 08/10/2017 0524   KETONESUR NEGATIVE 08/10/2017 0524   PROTEINUR NEGATIVE 08/10/2017 0524   NITRITE NEGATIVE 08/10/2017 0524   LEUKOCYTESUR NEGATIVE 08/10/2017 0524   Sepsis Labs: @LABRCNTIP (procalcitonin:4,lacticidven:4)  ) Recent Results (from the past 240 hour(s))  Gram stain     Status: None   Collection Time: 08/10/17 10:31 AM  Result Value Ref Range Status   Specimen Description PERITONEAL  Final   Special Requests NONE  Final   Gram Stain   Final    RARE WBC PRESENT, PREDOMINANTLY MONONUCLEAR NO ORGANISMS SEEN Performed at [facility], [address], [place], [place] [address]    Report Status 08/10/2017 FINAL  Final       Studies: Dg Chest 2 View  Result Date: 08/09/2017 CLINICAL DATA:  Dyspnea and abdominal distention. EXAM: CHEST - 2 VIEW COMPARISON:  None. FINDINGS: AP lordotic semi upright view of the chest demonstrates low lung volumes with right basilar atelectasis and/or scarring. Chronic mild elevation of the right hemidiaphragm. No pulmonary consolidation, effusion or pneumothorax. Mild aortic atherosclerosis. Normal size cardiac silhouette. No acute osseous abnormality. IMPRESSION: Low lung volumes with right basilar atelectasis and/or scarring. Aortic atherosclerosis. No active pulmonary disease. Electronically Signed   By: Ashley Royalty M.D.   On: 08/09/2017 18:58   Dg Abd 1 View  Result Date: 08/10/2017 CLINICAL DATA:  Hepatitis-C and cirrhosis, abdominal distension, hypertension EXAM: ABDOMEN - 1 VIEW COMPARISON:  01/04/2017 FINDINGS: Increased attenuation of abdomen with medial displacement of bowel loops due to ascites. No bowel dilatation or bowel wall thickening. Bones demineralized with mild scattered degenerative disc disease changes of the lumbar spine. IMPRESSION: Diffuse ascites. Otherwise normal bowel gas pattern. Electronically Signed   By: Crist Infante.D.  On: 08/10/2017 14:34   US Paracentesis  Result Date: 08/10/2017 INDICATION: Patient with history of Hep C and cirrhosis who presents with recurrent ascites. Request is made for diagnostic and therapeutic paracentesis. Patient to receive albumin post-procedure. EXAM: ULTRASOUND GUIDED DIAGNOSTIC AND THERAPEUTIC PARACENTESIS MEDICATIONS: 10 mL 1% lidocaine COMPLICATIONS: None immediate. PROCEDURE: Informed written consent was obtained from the patient after a discussion of the risks, benefits and alternatives to treatment. A timeout was performed prior to the initiation of the procedure. Initial ultrasound scanning demonstrates a large amount of ascites within the right lower abdominal quadrant. The right lower abdomen was prepped and draped  in the usual sterile fashion. 1% lidocaine was used for local anesthesia. Following this, a 19 gauge, 7-cm, Yueh catheter was introduced. An ultrasound image was saved for documentation purposes. The paracentesis was performed. The catheter was removed and a dressing was applied. The patient tolerated the procedure well without immediate post procedural complication. FINDINGS: A total of approximately 8.0 liters of yellow fluid was removed. Samples were sent to the laboratory as requested by the clinical team. IMPRESSION: Successful ultrasound-guided diagnostic and therapeutic paracentesis yielding 8.0 liters of peritoneal fluid. Read by: Brynda Greathouse PA-C Electronically Signed   By: Aletta Edouard M.D.   On: 08/10/2017 11:27    Scheduled Meds: . lactose free nutrition  237 mL Oral BID BM    Continuous Infusions: . albumin human 12.5 g (08/10/17 1243)  . cefTRIAXone (ROCEPHIN)  IV Stopped (08/10/17 0945)     LOS: 1 day     Kayleen Memos, MD [facility] Pager [phone]  If 7PM-7AM, please contact night-coverage [email or web address] Password [email or web address] 08/10/2017, 2:42 PM

## 2017-08-10 NOTE — Progress Notes (Signed)
Initial Nutrition Assessment  DOCUMENTATION CODES:   Severe malnutrition in context of chronic illness  INTERVENTION:  - Will d/c Ensure per patient preference. - Will order Boost Plus BID, each supplement provides 360 kcal and 14 grams of protein. - Continue to encourage PO intakes.  - Provided patient with Boost coupons.   NUTRITION DIAGNOSIS:   Severe Malnutrition related to chronic illness, catabolic illness, cancer and cancer related treatments as evidenced by severe fat depletion, severe muscle depletion, edema.  GOAL:   Patient will meet greater than or equal to 90% of their needs  MONITOR:   PO intake, Supplement acceptance, Weight trends, Labs, I & O's  REASON FOR ASSESSMENT:   Malnutrition Screening Tool  ASSESSMENT:   62 y.o. male with history of hepatitis C with cirrhosis who has had recent embolization on Jun 16, 2017 at [facility] for hepatocellular carcinoma presents to the ER with increasing abdominal distention shortness of breath and peripheral edema gradually developing over the last 3 weeks.  Patient states he stopped taking all his diuretics last 3 weeks ago since he thought it was not helping him.  Has abdominal distention but denies any acute pain or any nausea vomiting or diarrhea.   BMI indicates overweight status but is likely impacted by ascites and edema. No intakes documented since admission. Patient had ordered lunch shortly before RD visit and was waiting for it to arrive (pot roast, mashed potatoes, corn, a dinner roll, and a drink). He was feeling hungry and was looking forward to lunch.   He states that "since I got sick," which he states occurred in October 2018, he has had a poor appetite. He tries to monitor fluid intake but otherwise does not monitor the foods he eats closely d/t poor appetite and not eating much. He often has items delivered or will cook if a particular item appeals to him. He lives alone but states his sister is his  caregiver.   RN alerted RD that patient likes chocolate Boost, which he drinks at home, and that he buys 2 cases and tries to make them last for a month. Patient confirms this and was very appreciative of coupons.   He underwent paracentesis this AM with 8L removed and states he was informed of likely need to have more removed tomorrow or Friday. He states last paracenteses were in May (May 13: 6-8L removed, May 16: 4-5L removed, and May 18: 4-5L removed).   Medications reviewed; 12.5 g albumin x6 doses. Labs reviewed; Na: 131 mmol/L, BUN: 25 mg/dL, creatinine: 1.78 mg/dL, Ca: 8.7 mg/dL, GFR: 39 mL/min.       NUTRITION - FOCUSED PHYSICAL EXAM:    Most Recent Value  Orbital Region  Moderate depletion  Upper Arm Region  Severe depletion  Thoracic and Lumbar Region  Unable to assess [ascites]  Buccal Region  Moderate depletion  Temple Region  Moderate depletion  Clavicle Bone Region  Severe depletion  Clavicle and Acromion Bone Region  Severe depletion  Scapular Bone Region  Unable to assess  Dorsal Hand  Severe depletion  Patellar Region  No depletion  Anterior Thigh Region  No depletion  Posterior Calf Region  No depletion  Edema (RD Assessment)  Severe [BLE]  Hair  Reviewed  Eyes  Reviewed  Mouth  Reviewed  Skin  Reviewed  Nails  Reviewed       Diet Order:   Diet Order           Diet 2 gram sodium Room  service appropriate? Yes; Fluid consistency: Thin  Diet effective now          EDUCATION NEEDS:   No education needs have been identified at this time  Skin:  Skin Assessment: Reviewed RN Assessment  Last BM:  PTA/unknown  Height:   Ht Readings from Last 1 Encounters:  08/09/17 5' 9.5" (1.765 m)    Weight:   Wt Readings from Last 1 Encounters:  08/09/17 195 lb 8 oz (88.7 kg)    Ideal Body Weight:  74.09 kg  BMI:  Body mass index is 28.46 kg/m.  Estimated Nutritional Needs:   Kcal:  2458-0998 (25-28 kcal/kg)  Protein:  115-127 grams (1.5-1.7  grams/kg)  Fluid:  >/= 1.5 L/day     Jarome Matin, MS, RD, LDN, [facility] Inpatient Clinical Dietitian Pager # [phone] After hours/weekend pager # [phone]

## 2017-08-10 NOTE — Consult Note (Signed)
Referring Provider:  [facility] Primary Care Physician:  No primary care provider on file. Primary Gastroenterologist:  Dr. Alessandra Bevels  Reason for Consultation:  Decompensated cirrhosis, ascites  HPI: Jerry Mcintyre is a 62 y.o. male with past medical history of cirrhosis from chronic hepatitis C and prior alcohol use.\ complicated by recurrent ascites and hepatocellular carcinoma. Currently followed at [facility]  Liver clinic for evaluation of possible liver transplant. Underwent embolization on 06/16/17 followed by prolonged hospitalization.  He presented to the hospital with worsening abdominal distention.patient seen and examined at bedside. Apparently He received a letter from [facility] liver transplant team  about taking him off the list. Subsequently he  stopped taking all of his medications. It can develop worsening abdominal distention and weakness and came to the hospital. He denies any black tarry stool or bright red blood per rectum. Complaining of trace of blood in the sputum as well as bleeding from gums while pressing teeth. Denies any fever or chills.    Past Medical History:  Diagnosis Date  . Hernia, abdominal   . Hypertension   . 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 DENTAL SURGERY    . teeth removal      Prior to Admission medications   Medication Sig Start Date End Date Taking? Authorizing Provider  lactose free nutrition (BOOST) LIQD Take 237 mLs by mouth 3 (three) times daily between meals.   Yes [provider]  furosemide (LASIX) 40 MG tablet Take 1 tablet (40 mg total) by mouth 2 (two) times daily. For 1 week, start 40 mg daily from 04/10/2017 Patient not taking: Reported on 08/09/2017 04/03/17   Lavina Hamman, MD  methocarbamol (ROBAXIN) 500 MG tablet Take 1 tablet (500 mg total) by mouth every 8 (eight) hours as needed for muscle spasms. Patient not taking: Reported on 08/09/2017 04/03/17   Lavina Hamman, MD  oxyCODONE (OXY  IR/ROXICODONE) 5 MG immediate release tablet TAKE 1 TABLET (5 MG TOTAL) BY MOUTH EVERY 3 (THREE) HOURS AS NEEDED FOR PAIN 06/18/17   [provider]  pantoprazole (PROTONIX) 40 MG tablet Take 1 tablet (40 mg total) by mouth daily. Patient not taking: Reported on 08/09/2017 04/03/17   Lavina Hamman, MD  potassium chloride (KLOR-CON) 20 MEQ packet Take 20 mEq by mouth daily. Patient not taking: Reported on 08/09/2017 01/07/17   Rosita Fire, MD  spironolactone (ALDACTONE) 100 MG tablet Take 2 tablets (200 mg total) by mouth daily. Patient not taking: Reported on 08/09/2017 04/03/17   Lavina Hamman, MD    Scheduled Meds: . lactose free nutrition  237 mL Oral BID BM   Continuous Infusions: . albumin human 12.5 g (08/10/17 1243)  . cefTRIAXone (ROCEPHIN)  IV Stopped (08/10/17 0945)   PRN Meds:.acetaminophen **OR** acetaminophen, ondansetron **OR** ondansetron (ZOFRAN) IV, oxyCODONE  Allergies as of 08/09/2017 - Review Complete 08/09/2017  Allergen Reaction Noted  . Bee venom  01/05/2017    Family History  Problem Relation Age of Onset  . Stroke Mother   . Hypertension Mother     Social History   Socioeconomic History  . Marital status: Single    Spouse name: Not on file  . Number of children: Not on file  . Years of education: Not on file  . Highest education level: Not on file  Occupational History  . Not on file  Social Needs  . Financial resource strain: Not on file  . Food  insecurity:    Worry: Not on file    Inability: Not on file  . Transportation needs:    Medical: Not on file    Non-medical: Not on file  Tobacco Use  . Smoking status: Former Smoker    Last attempt to quit: 11/01/2016    Years since quitting: 0.7  . Smokeless tobacco: Never Used  Substance and Sexual Activity  . Alcohol use: Yes    Comment: "drink a beer in the summertime"  . Drug use: No  . Sexual activity: Not on file  Lifestyle  . Physical activity:    Days per week: Not on  file    Minutes per session: Not on file  . Stress: Not on file  Relationships  . Social connections:    Talks on phone: Not on file    Gets together: Not on file    Attends religious service: Not on file    Active member of club or organization: Not on file    Attends meetings of clubs or organizations: Not on file    Relationship status: Not on file  . Intimate partner violence:    Fear of current or ex partner: Not on file    Emotionally abused: Not on file    Physically abused: Not on file    Forced sexual activity: Not on file  Other Topics Concern  . Not on file  Social History Narrative  . Not on file    Review of Systems: Review of Systems  Constitutional: Positive for malaise/fatigue. Negative for chills and fever.  HENT: Negative for hearing loss and tinnitus.   Eyes: Negative for blurred vision and double vision.  Respiratory: Positive for cough, hemoptysis and sputum production.   Cardiovascular: Negative for chest pain and palpitations.  Gastrointestinal: Positive for abdominal pain and nausea. Negative for blood in stool, heartburn, melena and vomiting.  Genitourinary: Negative for dysuria and urgency.  Musculoskeletal: Positive for back pain and myalgias.  Skin: Negative for itching and rash.  Neurological: Negative for seizures and loss of consciousness.  Endo/Heme/Allergies: Bruises/bleeds easily.  Psychiatric/Behavioral: Negative for hallucinations and suicidal ideas.    Physical Exam: Vital signs: Vitals:   08/10/17 1100 08/10/17 1239  BP: 107/69 106/72  Pulse:  79  Resp:  20  Temp:  97.6 F (36.4 C)  SpO2:  96%     Physical Exam  Constitutional: He is oriented to person, place, and time.  Chronically ill-appearing patient. Resting comfortably  HENT:  Mouth/Throat: No oropharyngeal exudate.  Oral mucosa dry.  Eyes: EOM are normal.  Mild scleral icterus.  Neck: Normal range of motion. Neck supple.  Cardiovascular: Normal rate.  Murmur  heard. Pulmonary/Chest: Effort normal. No respiratory distress.  Basilar crackles.  Abdominal: Bowel sounds are normal. He exhibits distension. There is no tenderness. There is no rebound and no guarding.  Significantly distended abdomen with periumbilical hernia.  Musculoskeletal: Normal range of motion. He exhibits edema.  2+ lower Ext edema  Neurological: He is alert and oriented to person, place, and time.  Skin: Skin is dry. No erythema.  Psychiatric: He has a normal mood and affect. Judgment normal.  Vitals reviewed.   GI:  Lab Results: Recent Labs    08/09/17 1836 08/10/17 0506  WBC 3.1* 3.3*  HGB 11.2* 10.3*  HCT 32.2* 30.0*  PLT 122* 113*   BMET Recent Labs    08/09/17 1836 08/10/17 0506  NA 135 131*  K 4.9 4.5  CL 102 99  CO2  26 25  GLUCOSE 100* 111*  BUN 25* 25*  CREATININE 1.82* 1.78*  CALCIUM 9.1 8.7*   LFT Recent Labs    08/10/17 0506  PROT 8.0  ALBUMIN 3.4*  AST 59*  ALT 23  ALKPHOS 56  BILITOT 4.0*  BILIDIR 1.5*  IBILI 2.5*   PT/INR Recent Labs    08/09/17 1836  LABPROT 19.9*  INR 1.71     Studies/Results: Dg Chest 2 View  Result Date: 08/09/2017 CLINICAL DATA:  Dyspnea and abdominal distention. EXAM: CHEST - 2 VIEW COMPARISON:  None. FINDINGS: AP lordotic semi upright view of the chest demonstrates low lung volumes with right basilar atelectasis and/or scarring. Chronic mild elevation of the right hemidiaphragm. No pulmonary consolidation, effusion or pneumothorax. Mild aortic atherosclerosis. Normal size cardiac silhouette. No acute osseous abnormality. IMPRESSION: Low lung volumes with right basilar atelectasis and/or scarring. Aortic atherosclerosis. No active pulmonary disease. Electronically Signed   By: Ashley Royalty M.D.   On: 08/09/2017 18:58   Dg Abd 1 View  Result Date: 08/10/2017 CLINICAL DATA:  Hepatitis-C and cirrhosis, abdominal distension, hypertension EXAM: ABDOMEN - 1 VIEW COMPARISON:  01/04/2017 FINDINGS: Increased  attenuation of abdomen with medial displacement of bowel loops due to ascites. No bowel dilatation or bowel wall thickening. Bones demineralized with mild scattered degenerative disc disease changes of the lumbar spine. IMPRESSION: Diffuse ascites. Otherwise normal bowel gas pattern. Electronically Signed   By: Lavonia Dana M.D.   On: 08/10/2017 14:34   US Paracentesis  Result Date: 08/10/2017 INDICATION: Patient with history of Hep C and cirrhosis who presents with recurrent ascites. Request is made for diagnostic and therapeutic paracentesis. Patient to receive albumin post-procedure. EXAM: ULTRASOUND GUIDED DIAGNOSTIC AND THERAPEUTIC PARACENTESIS MEDICATIONS: 10 mL 1% lidocaine COMPLICATIONS: None immediate. PROCEDURE: Informed written consent was obtained from the patient after a discussion of the risks, benefits and alternatives to treatment. A timeout was performed prior to the initiation of the procedure. Initial ultrasound scanning demonstrates a large amount of ascites within the right lower abdominal quadrant. The right lower abdomen was prepped and draped in the usual sterile fashion. 1% lidocaine was used for local anesthesia. Following this, a 19 gauge, 7-cm, Yueh catheter was introduced. An ultrasound image was saved for documentation purposes. The paracentesis was performed. The catheter was removed and a dressing was applied. The patient tolerated the procedure well without immediate post procedural complication. FINDINGS: A total of approximately 8.0 liters of yellow fluid was removed. Samples were sent to the laboratory as requested by the clinical team. IMPRESSION: Successful ultrasound-guided diagnostic and therapeutic paracentesis yielding 8.0 liters of peritoneal fluid. Read by: Brynda Greathouse PA-C Electronically Signed   By: Aletta Edouard M.D.   On: 08/10/2017 11:27    Impression/Plan: - decompensated cirrhosis from chronic hepatitis C and prior alcohol use. MELD score 25 as of  yesterday/  - Refractory/recurrence ascites. - Hepatocellular carcinoma. Status post blended embolization in May 2019 at [facility]. - Chronic anemia - acute kidney insufficiency  Recommendations --------------------------- - difficult situation because of decompensated cirrhosis and refractory ascites.now with elevation in kidney function which will prevent up titration of diuretics.meld score 25 points towards a poor long-term prognosis. Unfortunately, he has been taken off of the liver transplant list. - recommend supportive care for now. - Patient declined EGD to screen/treat for esophageal varices. His hemoglobin has been stable. - he is due for  contrasted imaging of the liver for f/up for [facility] but would hold off at this time because of  acute kidney injury. - overall poor prognosis discussed with the patient. He verbalized understanding. Palliative care consult. - GI will follow   LOS: 1 day   Otis Brace  MD, FACP 08/10/2017, 2:45 PM  Contact #  [phone]

## 2017-08-10 NOTE — Procedures (Signed)
PROCEDURE SUMMARY:  Successful US guided paracentesis from right lateral abdomen.  Yielded 8.0 liters of yellow fluid.  No immediate complications.  Procedure was stopped prior to removal of all fluid per patient request.  He states 6-8 L is usually his maximum removed and he is most comfortable with this volume.   Pt tolerated well.  Spoke with Dr. Nevada Crane.   Specimen was sent for labs. Patient to receive albumin on unit.   Docia Barrier PA-C 08/10/2017 11:15 AM

## 2017-08-11 ENCOUNTER — Encounter (HOSPITAL_COMMUNITY): Payer: Self-pay

## 2017-08-11 DIAGNOSIS — R188 Other ascites: Secondary | ICD-10-CM

## 2017-08-11 DIAGNOSIS — R601 Generalized edema: Secondary | ICD-10-CM

## 2017-08-11 DIAGNOSIS — K7290 Hepatic failure, unspecified without coma: Secondary | ICD-10-CM

## 2017-08-11 DIAGNOSIS — E43 Unspecified severe protein-calorie malnutrition: Secondary | ICD-10-CM

## 2017-08-11 DIAGNOSIS — Z515 Encounter for palliative care: Secondary | ICD-10-CM

## 2017-08-11 DIAGNOSIS — Z7189 Other specified counseling: Secondary | ICD-10-CM

## 2017-08-11 LAB — COMPREHENSIVE METABOLIC PANEL
ALT: 18 U/L (ref 0–44)
ANION GAP: 6 (ref 5–15)
AST: 50 U/L — ABNORMAL HIGH (ref 15–41)
Albumin: 3.3 g/dL — ABNORMAL LOW (ref 3.5–5.0)
Alkaline Phosphatase: 46 U/L (ref 38–126)
BUN: 24 mg/dL — ABNORMAL HIGH (ref 8–23)
CHLORIDE: 101 mmol/L (ref 98–111)
CO2: 24 mmol/L (ref 22–32)
Calcium: 8.5 mg/dL — ABNORMAL LOW (ref 8.9–10.3)
Creatinine, Ser: 1.69 mg/dL — ABNORMAL HIGH (ref 0.61–1.24)
GFR calc non Af Amer: 41 mL/min — ABNORMAL LOW (ref >60)
GFR, EST AFRICAN AMERICAN: 48 mL/min — AB (ref >60)
Glucose, Bld: 107 mg/dL — ABNORMAL HIGH (ref 70–99)
Potassium: 5.1 mmol/L (ref 3.5–5.1)
Sodium: 131 mmol/L — ABNORMAL LOW (ref 135–145)
TOTAL PROTEIN: 7.1 g/dL (ref 6.5–8.1)
Total Bilirubin: 2.6 mg/dL — ABNORMAL HIGH (ref 0.3–1.2)

## 2017-08-11 LAB — AMMONIA: Ammonia: 40 umol/L — ABNORMAL HIGH (ref 9–35)

## 2017-08-11 LAB — CBC
HCT: 28.7 % — ABNORMAL LOW (ref 39.0–52.0)
Hemoglobin: 10 g/dL — ABNORMAL LOW (ref 13.0–17.0)
MCH: 33.9 pg (ref 26.0–34.0)
MCHC: 34.8 g/dL (ref 30.0–36.0)
MCV: 97.3 fL (ref 78.0–100.0)
PLATELETS: 91 10*3/uL — AB (ref 150–400)
RBC: 2.95 MIL/uL — ABNORMAL LOW (ref 4.22–5.81)
RDW: 15 % (ref 11.5–15.5)
WBC: 3.3 10*3/uL — ABNORMAL LOW (ref 4.0–10.5)

## 2017-08-11 MED ORDER — OXYCODONE HCL 5 MG PO TABS
5.0000 mg | ORAL_TABLET | Freq: Four times a day (QID) | ORAL | Status: DC | PRN
  Administered 2017-08-11 – 2017-08-12 (×2): 5 mg via ORAL
  Administered 2017-08-12 – 2017-08-16 (×5): 10 mg via ORAL
  Filled 2017-08-11: qty 2
  Filled 2017-08-11: qty 1
  Filled 2017-08-11: qty 2
  Filled 2017-08-11: qty 1
  Filled 2017-08-11 (×2): qty 2
  Filled 2017-08-11 (×2): qty 1
  Filled 2017-08-11: qty 2

## 2017-08-11 MED ORDER — ALBUMIN HUMAN 25 % IV SOLN
100.0000 g | Freq: Every morning | INTRAVENOUS | Status: DC
  Administered 2017-08-11: 100 g via INTRAVENOUS
  Administered 2017-08-12 (×8): 12.5 g via INTRAVENOUS
  Filled 2017-08-11 (×2): qty 400

## 2017-08-11 NOTE — Progress Notes (Addendum)
PROGRESS NOTE  DEVYON KEATOR VOZ:[id number] DOB: 1955-07-21 DOA: 08/09/2017 PCP: No primary care provider on file.  HPI/Recap of past 24 hours: Mr. Lederman is a 62 year old male with past medical history significant for advanced cirrhosis, hepatocellular carcinoma who presented to the ED with increasing abdominal distention and shortness of breath.  Found to be in anasarca with severe ascites.  08/10/2017: Patient seen and examined at his bedside.  He revealed last paracentesis was on May 13th 2019 at [facility].  Has been followed in the past by Dr. Alessandra Bevels who has been consulted and will see him during this admission.  Reports dyspnea at rest due to abdominal distention.  Paracentesis done this morning with 8 L of fluid removed by interventional radiology.  Fluid analysis pending.  08/11/17: Seen and examined at his bedside.  He has no new complaints.  Declines paracentesis today and would like to get it done tomorrow 08/12/2017.  Assessment/Plan: Active Problems:   Anasarca   Chronic hepatitis C without hepatic coma (HCC)   Decompensated hepatic cirrhosis (HCC)   Bilateral lower extremity edema   ARF (acute renal failure) (HCC)   Protein-calorie malnutrition, severe  Decompensated liver cirrhosis with large ascites and anasarca Post paracentesis on 08/10/2017 by interventional radiology with 8 L of fluid removed Rare WBCs present in body fluid-body culture no growth to date Declines repeated paracentesis on 08/11/2017.  Requests to get it done tomorrow 08/12/2017 Continue spironolactone Continue to monitor blood pressure GI following  Advanced liver cirrhosis with concern for esophageal varices IV ceftriaxone started empirically Continue PPI Patient declines EGD  Hyperbilirubinemia Indirect bilirubin 2.5 Direct bilirubin 1.5 Total bilirubin 4.0 Obtain peripheral smear  Chronic thrombocytopenia Platelets 91K from 113 K No sign of overt bleeding Repeat CBC in the  morning  Pancytopenia Could be contributed by hemodilution WBC 3.3 Hemoglobin 10.3 Platelets 113 Repeat CBC in the morning Peripheral smear pending  Hypervolemic hyponatremia Sodium 131 Resume diuretics Repeat BMP in the morning Monitor urine output  Anasarca Most likely secondary to hypoalbuminemia in the setting of advanced liver failure Resume diuretics  Noncompliance with medical management Patient needs reinforcement  CKD 3 Creatinine 1.69 from 1.78 Appears to be at baseline Avoid nephrotoxic agents Monitor urine output Repeat BMP in the morning  Generalized weakness/physical debility PT to assess Fall precautions  Goals of care Palliative care following Patient is now DNR     Code Status: Change in Code Status from full code to DNR as of 08/11/2017  Family Communication: None at bedside  Disposition Plan: Home when clinically stable in 1 to 2 days   Consultants:  GI  Procedures:  Paracentesis done on 08/10/2017  Antimicrobials:  IV ceftriaxone  DVT prophylaxis: SCDs   Objective: Vitals:   08/10/17 1239 08/10/17 2028 08/11/17 0518 08/11/17 1255  BP: 106/72 110/74 100/83 101/77  Pulse: 79 85 85 87  Resp: 20 18 18 18   Temp: 97.6 F (36.4 C) 98 F (36.7 C) 98.3 F (36.8 C) 99.2 F (37.3 C)  TempSrc: Oral Oral Oral Oral  SpO2: 96% 97% 99% 93%  Weight:   84.1 kg (185 lb 4.8 oz)   Height:        Intake/Output Summary (Last 24 hours) at 08/11/2017 1416 Last data filed at 08/11/2017 1000 Gross per 24 hour  Intake 1096.67 ml  Output 450 ml  Net 646.67 ml   Filed Weights   08/09/17 2229 08/11/17 0518  Weight: 88.7 kg (195 lb 8 oz) 84.1 kg (185 lb 4.8  oz)    Exam:  . General: 62 y.o. year-old male well-developed well-nourished in no acute distress.  Alert and oriented x3. . Cardiovascular: Regular rate and rhythm with no rubs or gallops.  No thyromegaly or JVD noted. Marland Kitchen Respiratory: Clear to auscultation with no wheezes or rales.  Good inspiratory effort. . Abdomen: Severely distended abdomen with hypoactive bowel sounds x4. . Musculoskeletal: 1+ pitting edema in lower extremities bilaterally. Marland Kitchen Psychiatry: Mood is appropriate for condition and setting   Data Reviewed: CBC: Recent Labs  Lab 08/09/17 1836 08/10/17 0506 08/11/17 0517  WBC 3.1* 3.3* 3.3*  NEUTROABS 1.7  --   --   HGB 11.2* 10.3* 10.0*  HCT 32.2* 30.0* 28.7*  MCV 98.8 98.7 97.3  PLT 122* 113* 91*   Basic Metabolic Panel: Recent Labs  Lab 08/09/17 1836 08/10/17 0506 08/11/17 0517  NA 135 131* 131*  K 4.9 4.5 5.1  CL 102 99 101  CO2 26 25 24   GLUCOSE 100* 111* 107*  BUN 25* 25* 24*  CREATININE 1.82* 1.78* 1.69*  CALCIUM 9.1 8.7* 8.5*   GFR: Estimated Creatinine Clearance: 45.5 mL/min (A) (by C-G formula based on SCr of 1.69 mg/dL (H)). Liver Function Tests: Recent Labs  Lab 08/09/17 1836 08/10/17 0506 08/11/17 0517  AST 67* 59* 50*  ALT 23 23 18   ALKPHOS 60 56 46  BILITOT 4.2* 4.0* 2.6*  PROT 8.6* 8.0 7.1  ALBUMIN 3.5 3.4* 3.3*   Recent Labs  Lab 08/09/17 1836  LIPASE 48   Recent Labs  Lab 08/11/17 0517  AMMONIA 40*   Coagulation Profile: Recent Labs  Lab 08/09/17 1836  INR 1.71   Cardiac Enzymes: No results for input(s): CKTOTAL, CKMB, CKMBINDEX, TROPONINI in the last 168 hours. BNP (last 3 results) No results for input(s): PROBNP in the last 8760 hours. HbA1C: No results for input(s): HGBA1C in the last 72 hours. CBG: No results for input(s): GLUCAP in the last 168 hours. Lipid Profile: No results for input(s): CHOL, HDL, LDLCALC, TRIG, CHOLHDL, LDLDIRECT in the last 72 hours. Thyroid Function Tests: No results for input(s): TSH, T4TOTAL, FREET4, T3FREE, THYROIDAB in the last 72 hours. Anemia Panel: No results for input(s): VITAMINB12, FOLATE, FERRITIN, TIBC, IRON, RETICCTPCT in the last 72 hours. Urine analysis:    Component Value Date/Time   COLORURINE AMBER (A) 08/10/2017 0524   APPEARANCEUR  CLEAR 08/10/2017 0524   LABSPEC 1.020 08/10/2017 0524   PHURINE 5.0 08/10/2017 0524   GLUCOSEU NEGATIVE 08/10/2017 0524   HGBUR NEGATIVE 08/10/2017 0524   BILIRUBINUR NEGATIVE 08/10/2017 0524   KETONESUR NEGATIVE 08/10/2017 0524   PROTEINUR NEGATIVE 08/10/2017 0524   NITRITE NEGATIVE 08/10/2017 0524   LEUKOCYTESUR NEGATIVE 08/10/2017 0524   Sepsis Labs: @LABRCNTIP (procalcitonin:4,lacticidven:4)  ) Recent Results (from the past 240 hour(s))  Culture, body fluid-bottle     Status: None (Preliminary result)   Collection Time: 08/10/17 10:31 AM  Result Value Ref Range Status   Specimen Description PERITONEAL  Final   Special Requests NONE  Final   Culture   Final    NO GROWTH < 24 HOURS Performed at [facility], [place] [address], [place], [place] [address]    Report Status PENDING  Incomplete  Gram stain     Status: None   Collection Time: 08/10/17 10:31 AM  Result Value Ref Range Status   Specimen Description PERITONEAL  Final   Special Requests NONE  Final   Gram Stain   Final    RARE WBC PRESENT,  PREDOMINANTLY MONONUCLEAR NO ORGANISMS SEEN Performed at [facility], [place] [address], [place], [place] [address]    Report Status 08/10/2017 FINAL  Final      Studies: Dg Abd 1 View  Result Date: 08/10/2017 CLINICAL DATA:  Hepatitis-C and cirrhosis, abdominal distension, hypertension EXAM: ABDOMEN - 1 VIEW COMPARISON:  01/04/2017 FINDINGS: Increased attenuation of abdomen with medial displacement of bowel loops due to ascites. No bowel dilatation or bowel wall thickening. Bones demineralized with mild scattered degenerative disc disease changes of the lumbar spine. IMPRESSION: Diffuse ascites. Otherwise normal bowel gas pattern. Electronically Signed   By: Lavonia Dana M.D.   On: 08/10/2017 14:34    Scheduled Meds: . lactose free nutrition  237 mL Oral TID BM  . lactulose  10 g Oral Daily  . pantoprazole  40 mg Oral Daily  . spironolactone  12.5 mg Oral Daily     Continuous Infusions: . cefTRIAXone (ROCEPHIN)  IV 2 g (08/11/17 0622)     LOS: 2 days     Kayleen Memos, MD [facility] Pager [phone]  If 7PM-7AM, please contact night-coverage [email or web address] Password [email or web address] 08/11/2017, 2:16 PM

## 2017-08-11 NOTE — Progress Notes (Signed)
[facility] Progress Note  Jerry Mcintyre 62 y.o. 01-21-1956  CC:  Decompensated cirrhosis   Subjective: Complaining of worsening abdominal distention with ascites. Denies any black color stool. No vomiting.  ROS : Negative for chest pain. Mild shortness of breath.   Objective: Vital signs in last 24 hours: Vitals:   08/11/17 0518 08/11/17 1255  BP: 100/83 101/77  Pulse: 85 87  Resp: 18 18  Temp: 98.3 F (36.8 C) 99.2 F (37.3 C)  SpO2: 99% 93%    Physical Exam:   Constitutional: He is oriented to person, place, and time.  Chronically ill-appearing patient. Resting comfortably  HENT:  Mouth/Throat: No oropharyngeal exudate. Oral mucosa dry.  Eyes: EOM are normal. Mild scleral icterus.  Neck: Normal range of motion. Neck supple.  Cardiovascular: Normal rate. Murmur heard. Pulmonary/Chest: Effort normal. No respiratory distress. Basilar crackles.  Abdominal: Bowel sounds are normal. He exhibits distension. There is no tenderness. There is no rebound and no guarding. Significantly distended abdomen with periumbilical hernia.  Musculoskeletal: Normal range of motion. He exhibits edema. 2+ lower Ext edema    Lab Results: Recent Labs    08/10/17 0506 08/11/17 0517  NA 131* 131*  K 4.5 5.1  CL 99 101  CO2 25 24  GLUCOSE 111* 107*  BUN 25* 24*  CREATININE 1.78* 1.69*  CALCIUM 8.7* 8.5*   Recent Labs    08/10/17 0506 08/11/17 0517  AST 59* 50*  ALT 23 18  ALKPHOS 56 46  BILITOT 4.0* 2.6*  PROT 8.0 7.1  ALBUMIN 3.4* 3.3*   Recent Labs    08/09/17 1836 08/10/17 0506 08/11/17 0517  WBC 3.1* 3.3* 3.3*  NEUTROABS 1.7  --   --   HGB 11.2* 10.3* 10.0*  HCT 32.2* 30.0* 28.7*  MCV 98.8 98.7 97.3  PLT 122* 113* 91*   Recent Labs    08/09/17 1836  LABPROT 19.9*  INR 1.71      Assessment/Plan: - decompensated cirrhosis from chronic hepatitis C and prior alcohol use. MELD score 25 as of yesterday/  - Refractory/recurrence ascites. -  Hepatocellular carcinoma. Status post blended embolization in May 2019 at [facility]. - Chronic anemia - acute kidney insufficiency  Recommendations --------------------------- - No significant improvement in kidney function. Increase albumin 100 g per day. - Appreciate palliative care input. - Patient is requesting repeat paracentesis. Plan for another paracentesis tomorrow. Maximum 8 L. - recommend supportive care for now.  - difficult situation because of decompensated cirrhosis and refractory ascites.now with elevation in kidney function which will prevent up titration of diuretics.meld score 25 points towards a poor long-term prognosis. Unfortunately, he has been taken off of the liver transplant list.  - Patient declined EGD to screen/treat for esophageal varices. His hemoglobin has been stable. - he is due for  contrasted imaging of the liver for f/up for [facility] but would hold off at this time because of acute kidney injury. - overall poor prognosis discussed with the patient. He verbalized understanding. - GI will follow     Otis Brace MD, [place] 08/11/2017, 3:40 PM  Contact #  [phone]

## 2017-08-11 NOTE — Consult Note (Signed)
Consultation Note Date: 08/11/2017   Patient Name: Jerry Mcintyre  DOB: 11-06-1955  MRN: [id number]  Age / Sex: 62 y.o., male  PCP: No primary care provider on file. Referring Physician: Kayleen Memos, DO  Reason for Consultation: Establishing goals of care  HPI/Patient Profile: 62 y.o. male  with past medical history of hypertension, hernia, hepatis C, and liver cirrhosis admitted on 08/09/2017 with abdominal distention, shortness of breath, and swelling. Patient recently diagnosed with hepatocellular carcinoma and underwent embolization 06/16/17 at [facility]. He has been followed by [facility] liver transplant team but was recently taken off transplant list. Subsequently, patient stopped taking all of his medications. In ED, patient with anasarca and severe ascites. Paracentesis on 7/10 with 8L removed. Receiving IV albumin and spironolactone restarted. Also with acute kidney insufficiency. GI following and recommending continued supportive care. Patient declining EGD for possible esophageal varices. Palliative medicine consultation for goals of care.   Clinical Assessment and Goals of Care: I have reviewed medical records, discussed with care team, and met with patient at bedside to discuss diagnosis, prognosis, GOC, and EOL wishes. Patient is awake, alert, oriented and able to participate in [place] conversation.   Introduced Palliative Medicine as specialized medical care for people living with serious illness. It focuses on providing relief from the symptoms and stress of a serious illness. The goal is to improve quality of life for both the patient and the family.  We discussed a brief life review of the patient. Per patient, diagnosed with liver cirrhosis in October 2018. He speaks of how quickly his health has declined since this diagnosis, with significant weight loss and poor nutritional status. He was on the transplant  list but was recently taken off, for which he is saddened about. He tells me procedure was done at [facility] in May for which cancer was removed but he now suffers from worsening lower abdominal pain and left leg pain post-procedure. He tells me he will not go back to [facility].   [name] lives alone and is able to function fairly independently. His two sisters Rennis Golden and Judeth Porch) live nearby and are his support system.   Discussed events leading up to hospitalization including diagnoses and interventions. He speaks of Dr. Leanna Sato conversation including prognosis of "9-12 months" or "3-5 years." Patient is worried by this and not truly understanding how much time he has left. I explained that Dr. Alessandra Bevels wanted him to understand that he has a poor long-term prognosis and s/s of worsening cirrhosis indicating disease progression. He states "I believe in miracles." He shares his strong faith and belief that this is in God's hands and only he knows how much time is left.   I attempted to elicit values and goals of care important to the patient. Advanced directives, concepts specific to code status, and artifical feeding and hydration were discussed. Patient does not have a documented living will or HCPOA. I asked who he most trusted to make decisions for him if he was unable to make decisions. [name] tells  me he wishes for his sister Rennis Golden and Judeth Porch to be HCPOA's. Reviewed AD packet with patient and encouraged him to complete with chaplain.   Reviewed MOST form with patient and recommendations against heroic interventions (feeding tube, resuscitation, life support) with underlying incurable liver cirrhosis. Patient tells me he is a DNR. I reviewed chart and explained that he is a FULL code in the chart. Patient speaks of NOT wanting resuscitation if his heart stopped and would wish to have comfort and dignity at EOL. We reviewed and completed MOST form. DNR/DNI, limited interventions including  CPAP/BiPAP if indicated, IVF/ABX if indicated, and NO feeding tube.   While at bedside, [name] and I called sister, Rennis Golden, to update her on his wishes for DNR/DNI. Copies of MOST form and durable DNR made for patient and family. Loraine understands and respects his decisions. She agrees to be his HCPOA.   Palliative Care services outpatient were explained and offered. Explained eligibility for hospice services in the future and hospice philosophy. Patient interested in outpatient palliative referral.   Patient wishes for paracentesis to be deferred until tomorrow. I have discussed with attending, who is agreeable. Explained that he may require more frequent paracentesis in the future if fluid continues to accumulate. He is hopeful that medications will help prevent fluid overload.   Questions and concerns were addressed. Therapeutic listening as patient shares many stories of his love for traveling around the country when he was well. Emotional/spiritual support provided. PMT contact information given.    SUMMARY OF RECOMMENDATIONS    Good initial palliative discussion with patient.   Patient has a good understanding of diagnoses and poor long-term prognosis. He understands he is no longer a candidate for liver transplant. He states relief from paracentesis' but wishes to wait till tomorrow for second paracentesis. Attending agreeable. Patient hopeful that fluid will be managed now that he has restarted his medications.   AD packet reviewed. Patient wishes for his sisters Rennis Golden and Judeth Porch) to be HCPOA's. Encouraged patient to complete AD packet with chaplain while hospitalized.   MOST form completed with patient. Patient speaks of his wishes against heroic measures including resuscitation, life support, or feeding tube. Code status changed to DNR/DNI. Limited interventions including CPAP/BiPAP if indicated, ABX/IVF if indicated, and NO feeding tube. Durable DNR completed. MOST and Durable  copies made for patient and family.   Updated sister on wishes for DNR/DNI and no feeding tube. Sister understands and respects his decision.   Encouraged outpatient palliative referral.  Continue supportive care and symptom management.   Code Status/Advance Care Planning:  DNR  Symptom Management:   Oxycodone 5-42m PO q6h prn pain  Lactulose daily  Paracentesis per attending. Patient requesting to have paracentesis tomorrow instead of today.  Palliative Prophylaxis:   Bowel Regimen and Frequent Pain Assessment  Additional Recommendations (Limitations, Scope, Preferences):  DNR/DNI. Continue medical management.  Psycho-social/Spiritual:   Desire for further Chaplaincy support:yes  Additional Recommendations: Caregiving  Support/Resources and Education on Hospice  Prognosis:   Unable to determine: guarded to poor with decompensated liver cirrhosis with recurrent ascites and not a candidate for liver transplant. Also underlying thrombocytopenia, anemia, and malnutrition.   Discharge Planning: To Be Determined likely back home      Primary Diagnoses: Present on Admission: . Decompensated hepatic cirrhosis ([facility] . Anasarca . Chronic hepatitis C without hepatic coma ([facility] . ARF (acute renal failure) ([facility]   I have reviewed the medical record, interviewed the patient and family, and examined the patient. The  following aspects are pertinent.  Past Medical History:  Diagnosis Date  . Hernia, abdominal   . Hypertension   . Slipped disc in neck    Social History   Socioeconomic History  . Marital status: Single    Spouse name: Not on file  . Number of children: Not on file  . Years of education: Not on file  . Highest education level: Not on file  Occupational History  . Not on file  Social Needs  . Financial resource strain: Not on file  . Food insecurity:    Worry: Not on file    Inability: Not on file  . Transportation needs:    Medical: Not on file     Non-medical: Not on file  Tobacco Use  . Smoking status: Former Smoker    Last attempt to quit: 11/01/2016    Years since quitting: 0.7  . Smokeless tobacco: Never Used  Substance and Sexual Activity  . Alcohol use: Yes    Comment: "drink a beer in the summertime"  . Drug use: No  . Sexual activity: Not on file  Lifestyle  . Physical activity:    Days per week: Not on file    Minutes per session: Not on file  . Stress: Not on file  Relationships  . Social connections:    Talks on phone: Not on file    Gets together: Not on file    Attends religious service: Not on file    Active member of club or organization: Not on file    Attends meetings of clubs or organizations: Not on file    Relationship status: Not on file  Other Topics Concern  . Not on file  Social History Narrative  . Not on file   Family History  Problem Relation Age of Onset  . Stroke Mother   . Hypertension Mother    Scheduled Meds: . lactose free nutrition  237 mL Oral TID BM  . lactulose  10 g Oral Daily  . pantoprazole  40 mg Oral Daily  . spironolactone  12.5 mg Oral Daily   Continuous Infusions: . albumin human    . cefTRIAXone (ROCEPHIN)  IV 2 g (08/11/17 0622)   PRN Meds:.acetaminophen **OR** acetaminophen, ondansetron **OR** ondansetron (ZOFRAN) IV, oxyCODONE Medications Prior to Admission:  Prior to Admission medications   Medication Sig Start Date End Date Taking? Authorizing Provider  lactose free nutrition (BOOST) LIQD Take 237 mLs by mouth 3 (three) times daily between meals.   Yes [provider]  furosemide (LASIX) 40 MG tablet Take 1 tablet (40 mg total) by mouth 2 (two) times daily. For 1 week, start 40 mg daily from 04/10/2017 Patient not taking: Reported on 08/09/2017 04/03/17   Lavina Hamman, MD  methocarbamol (ROBAXIN) 500 MG tablet Take 1 tablet (500 mg total) by mouth every 8 (eight) hours as needed for muscle spasms. Patient not taking: Reported on 08/09/2017 04/03/17    Lavina Hamman, MD  oxyCODONE (OXY IR/ROXICODONE) 5 MG immediate release tablet TAKE 1 TABLET (5 MG TOTAL) BY MOUTH EVERY 3 (THREE) HOURS AS NEEDED FOR PAIN 06/18/17   [provider]  pantoprazole (PROTONIX) 40 MG tablet Take 1 tablet (40 mg total) by mouth daily. Patient not taking: Reported on 08/09/2017 04/03/17   Lavina Hamman, MD  potassium chloride (KLOR-CON) 20 MEQ packet Take 20 mEq by mouth daily. Patient not taking: Reported on 08/09/2017 01/07/17   Rosita Fire, MD  spironolactone (ALDACTONE) 100  MG tablet Take 2 tablets (200 mg total) by mouth daily. Patient not taking: Reported on 08/09/2017 04/03/17   Lavina Hamman, MD   Allergies  Allergen Reactions  . Bee Venom    Review of Systems  Constitutional: Positive for activity change.       Left leg pain  Gastrointestinal: Positive for abdominal distention.  Neurological: Positive for weakness.   Physical Exam  Constitutional: He is oriented to person, place, and time. He is cooperative.  HENT:  Head: Normocephalic and atraumatic.  Pulmonary/Chest: No accessory muscle usage. No tachypnea. No respiratory distress.  Abdominal: He exhibits distension and ascites. There is no tenderness.  Neurological: He is alert and oriented to person, place, and time.  Skin: Skin is warm and dry.  jaundice  Psychiatric: He has a normal mood and affect. His speech is normal and behavior is normal. Cognition and memory are normal.  Nursing note and vitals reviewed.  Vital Signs: BP 101/77 (BP Location: Right Arm)   Pulse 87   Temp 99.2 F (37.3 C) (Oral)   Resp 18   Ht 5' 9.5" (1.765 m)   Wt 84.1 kg (185 lb 4.8 oz)   SpO2 93%   BMI 26.97 kg/m  Pain Scale: 0-10 POSS *See Group Information*: 1-Acceptable,Awake and alert Pain Score: Asleep   SpO2: SpO2: 93 % O2 Device:SpO2: 93 % O2 Flow Rate: .   IO: Intake/output summary:   Intake/Output Summary (Last 24 hours) at 08/11/2017 1611 Last data filed at 08/11/2017  1000 Gross per 24 hour  Intake 946.67 ml  Output 450 ml  Net 496.67 ml    LBM: Last BM Date: 08/10/17 Baseline Weight: Weight: 88.7 kg (195 lb 8 oz) Most recent weight: Weight: 84.1 kg (185 lb 4.8 oz)     Palliative Assessment/Data: PPS 50%   Flowsheet Rows     Most Recent Value  Intake Tab  Referral Department  Hospitalist  Unit at Time of Referral  Med/Surg Unit  Palliative Care Primary Diagnosis  -- [liver cirrhosis]  Palliative Care Type  New Palliative care  Reason for referral  Clarify Goals of Care  Date first seen by Palliative Care  08/11/17  Clinical Assessment  Palliative Performance Scale Score  50%  Psychosocial & Spiritual Assessment  Palliative Care Outcomes  Patient/Family meeting held?  Yes  Who was at the meeting?  patient, spoke with sister via telephone  Palliative Care Outcomes  Clarified goals of care, Improved pain interventions, Counseled regarding hospice, Provided psychosocial or spiritual support, ACP counseling assistance, Provided end of life care assistance, Provided advance care planning, Changed CPR status, Linked to palliative care logitudinal support, Completed durable DNR      Time In: 1000 Time Out: 1130 Time Total: 59mn Greater than 50%  of this time was spent counseling and coordinating care related to the above assessment and plan.  Signed by:  MIhor Dow FNP-C Palliative Medicine Team  Phone: [phone]: [phone]  Please contact Palliative Medicine Team phone at [phone] questions and concerns.  For individual provider: See AShea Evans

## 2017-08-11 NOTE — Evaluation (Signed)
Physical Therapy Evaluation Patient Details Name: Jerry Mcintyre MRN: [id number] DOB: 27-Feb-1955 Today's Date: 08/11/2017   History of Present Illness  Pt is a 62 year old male with past medical history significant for advanced cirrhosis, hepatocellular carcinoma who presented to the ED with increasing abdominal distention and shortness of breath.  Found to be in anasarca with severe ascites.  Clinical Impression  Pt admitted with above diagnosis. Pt currently with functional limitations due to the deficits listed below (see PT Problem List).  Pt will benefit from skilled PT to increase their independence and safety with mobility to allow discharge to the venue listed below.  Pt a little unsteady with mobility however improved with ambulation distance.  Pt encouraged to ambulate with staff again later today as he hopes to d/c home tomorrow.     Follow Up Recommendations No PT follow up    Equipment Recommendations  None recommended by PT    Recommendations for Other Services       Precautions / Restrictions Precautions Precautions: Fall      Mobility  Bed Mobility Overal bed mobility: Modified Independent                Transfers Overall transfer level: Needs assistance Equipment used: None Transfers: Sit to/from Stand Sit to Stand: Min guard         General transfer comment: min/guard for safety, slightly unsteady however self corrected  Ambulation/Gait Ambulation/Gait assistance: Min guard;Supervision Gait Distance (Feet): 600 Feet Assistive device: None Gait Pattern/deviations: Step-through pattern;Decreased stride length;Wide base of support;Drifts right/left     General Gait Details: slightly unsteady however able to self correct; has RW at home if needed however reports he has never used this  Financial trader Rank[employer] (Stroke Patients Only)       Balance Overall balance assessment: Mild deficits observed,  not formally tested                                           Pertinent Vitals/Pain Pain Assessment: No/denies pain    Home Living Family/patient expects to be discharged to:: Private residence Living Arrangements: Alone   Type of Home: House Home Access: Ramped entrance     Home Layout: One level Home Equipment: Grab bars - toilet;Grab bars - tub/shower;Walker - 2 wheels      Prior Function Level of Independence: Independent               Hand Dominance        Extremity/Trunk Assessment        Lower Extremity Assessment Lower Extremity Assessment: Generalized weakness    Cervical / Trunk Assessment Cervical / Trunk Assessment: Other exceptions Cervical / Trunk Exceptions: significant abdominal distention/ascites; paracentesis 08/10/17 and pt reports another planned for tomorrow, 7/12  Communication   Communication: No difficulties  Cognition Arousal/Alertness: Awake/alert Behavior During Therapy: WFL for tasks assessed/performed Overall Cognitive Status: Within Functional Limits for tasks assessed                                        General Comments      Exercises     Assessment/Plan    PT Assessment Patient needs continued PT services  PT Problem  List Decreased strength;Decreased mobility;Decreased balance       PT Treatment Interventions DME instruction;Gait training;Therapeutic exercise;Patient/family education;Functional mobility training;Balance training    PT Goals (Current goals can be found in the Care Plan section)  Acute Rehab PT Goals PT Goal Formulation: With patient Time For Goal Achievement: 08/18/17 Potential to Achieve Goals: Good    Frequency Min 2X/week   Barriers to discharge        Co-evaluation               AM-PAC PT "6 Clicks" Daily Activity  Outcome Measure Difficulty turning over in bed (including adjusting bedclothes, sheets and blankets)?: None Difficulty moving  from lying on back to sitting on the side of the bed? : None Difficulty sitting down on and standing up from a chair with arms (e.g., wheelchair, bedside commode, etc,.)?: None Help needed moving to and from a bed to chair (including a wheelchair)?: A Little Help needed walking in hospital room?: A Little Help needed climbing 3-5 steps with a railing? : A Little 6 Click Score: 21    End of Session   Activity Tolerance: Patient tolerated treatment well Patient left: in bed;with call bell/phone within reach;with nursing/sitter in room Nurse Communication: Mobility status PT Visit Diagnosis: Difficulty in walking, not elsewhere classified (R26.2)    Time: 7035-0093 PT Time Calculation (min) (ACUTE ONLY): 27 min   Charges:   PT Evaluation $PT Eval Low Complexity: 1 Low     PT G CodesCarmelia Bake, PT, DPT 08/11/2017 Pager: [phone]  York Ram E 08/11/2017, 4:01 PM

## 2017-08-12 ENCOUNTER — Inpatient Hospital Stay (HOSPITAL_COMMUNITY): Payer: Medicare Other

## 2017-08-12 LAB — COMPREHENSIVE METABOLIC PANEL
ALK PHOS: 41 U/L (ref 38–126)
ALT: 15 U/L (ref 0–44)
AST: 41 U/L (ref 15–41)
Albumin: 4.5 g/dL (ref 3.5–5.0)
Anion gap: 6 (ref 5–15)
BUN: 22 mg/dL (ref 8–23)
CALCIUM: 8.8 mg/dL — AB (ref 8.9–10.3)
CO2: 25 mmol/L (ref 22–32)
Chloride: 99 mmol/L (ref 98–111)
Creatinine, Ser: 1.6 mg/dL — ABNORMAL HIGH (ref 0.61–1.24)
GFR, EST AFRICAN AMERICAN: 51 mL/min — AB (ref >60)
GFR, EST NON AFRICAN AMERICAN: 44 mL/min — AB (ref >60)
Glucose, Bld: 94 mg/dL (ref 70–99)
Potassium: 5.1 mmol/L (ref 3.5–5.1)
SODIUM: 130 mmol/L — AB (ref 135–145)
Total Bilirubin: 2.1 mg/dL — ABNORMAL HIGH (ref 0.3–1.2)
Total Protein: 7.2 g/dL (ref 6.5–8.1)

## 2017-08-12 LAB — CBC
HCT: 25.7 % — ABNORMAL LOW (ref 39.0–52.0)
HEMOGLOBIN: 9 g/dL — AB (ref 13.0–17.0)
MCH: 34 pg (ref 26.0–34.0)
MCHC: 35 g/dL (ref 30.0–36.0)
MCV: 97 fL (ref 78.0–100.0)
Platelets: 85 10*3/uL — ABNORMAL LOW (ref 150–400)
RBC: 2.65 MIL/uL — ABNORMAL LOW (ref 4.22–5.81)
RDW: 14.9 % (ref 11.5–15.5)
WBC: 3.6 10*3/uL — ABNORMAL LOW (ref 4.0–10.5)

## 2017-08-12 MED ORDER — FUROSEMIDE 20 MG PO TABS
20.0000 mg | ORAL_TABLET | Freq: Every day | ORAL | Status: DC
  Administered 2017-08-12 – 2017-08-16 (×5): 20 mg via ORAL
  Filled 2017-08-12 (×5): qty 1

## 2017-08-12 MED ORDER — SPIRONOLACTONE 25 MG PO TABS
25.0000 mg | ORAL_TABLET | Freq: Every day | ORAL | Status: DC
  Administered 2017-08-13 – 2017-08-16 (×4): 25 mg via ORAL
  Filled 2017-08-12 (×4): qty 1

## 2017-08-12 MED ORDER — LIDOCAINE HCL 1 % IJ SOLN
INTRAMUSCULAR | Status: AC
  Filled 2017-08-12: qty 10

## 2017-08-12 MED ORDER — LACTULOSE 10 GM/15ML PO SOLN
30.0000 g | Freq: Every day | ORAL | Status: DC
  Administered 2017-08-12 – 2017-08-13 (×2): 30 g via ORAL
  Filled 2017-08-12 (×2): qty 45

## 2017-08-12 NOTE — Progress Notes (Signed)
   08/12/17 1200  Clinical Encounter Type  Visited With Patient  Visit Type Initial  Referral From Nurse  Consult/Referral To Chaplain  Spiritual Encounters  Spiritual Needs Prayer;Emotional   Responded to a SCC for HCPA.  Patient was not interested in filling one out at this time.  Shared about how less than a year ago he was feeling good not any signs of problems and now here he is.  Shared his story and experience at [facility] and I think holds some bitter feelings about how he states they treated him  He has a positive outlook that once this fluid gets drained he can go home.  He looks forward to getting out of the hospital and believes he can get better that is quality of life.  Has support from his sisters.  He has a strong faith and we prayed together.  Does not even want to discuss Hospice with anyone bc he believes he will get better, not cure per say, but better.  Will follow and support as needed. Chaplain Katherene Ponto

## 2017-08-12 NOTE — Progress Notes (Signed)
PT Cancellation Note  Patient Details Name: JAKEEL STARLIPER MRN: [id number] DOB: 18-Jun-1955   Cancelled Treatment:     pt at a proceedure.  Paracentesis.  Pt has been evaluated with no post acute stay rec.    Rica Koyanagi  PTA [facility]  Acute  Rehab Pager      [phone]

## 2017-08-12 NOTE — Procedures (Signed)
Ultrasound-guided  therapeutic paracentesis performed yielding 8 liters (maximum ordered) of golden yellow fluid. No immediate complications.

## 2017-08-12 NOTE — Care Management Important Message (Signed)
Important Message  Patient Details  Name: ITAI BARBIAN MRN: [id number] Date of Birth: 18-Nov-1955   Medicare Important Message Given:  Yes    Kerin Salen 08/12/2017, 12:14 PMImportant Message  Patient Details  Name: ASHLEIGH ARYA MRN: [id number] Date of Birth: 1955/05/11   Medicare Important Message Given:  Yes    Kerin Salen 08/12/2017, 12:14 PM

## 2017-08-12 NOTE — Progress Notes (Signed)
[facility] Gastroenterology Progress Note  Jerry Mcintyre 62 y.o. 1955/04/23  CC:  Decompensated cirrhosis   Subjective: feeling better after paracentesis. Denied nausea, vomiting. Continues to have abdominal distention.  ROS : Negative for chest pain. Mild shortness of breath.   Objective: Vital signs in last 24 hours: Vitals:   08/12/17 1333 08/12/17 1355  BP: 110/79 102/74  Pulse:  84  Resp:  18  Temp:  97.9 F (36.6 C)  SpO2:  95%    Physical Exam:   Constitutional: He is oriented to person, place, and time.  Chronically ill-appearing patient. Resting comfortably  HENT:  Mouth/Throat: No oropharyngeal exudate. Oral mucosa dry.  Cardiovascular: Normal rate. Murmur heard. Pulmonary/Chest: Effort normal. No respiratory distress. Basilar crackles.  Abdominal: Bowel sounds are normal. He exhibits distension. There is no tenderness. There is no rebound and no guarding. distended abdomen with periumbilical hernia.  Musculoskeletal: Normal range of motion. He exhibits edema. 2+ lower Ext edema    Lab Results: Recent Labs    08/11/17 0517 08/12/17 0454  NA 131* 130*  K 5.1 5.1  CL 101 99  CO2 24 25  GLUCOSE 107* 94  BUN 24* 22  CREATININE 1.69* 1.60*  CALCIUM 8.5* 8.8*   Recent Labs    08/11/17 0517 08/12/17 0454  AST 50* 41  ALT 18 15  ALKPHOS 46 41  BILITOT 2.6* 2.1*  PROT 7.1 7.2  ALBUMIN 3.3* 4.5   Recent Labs    08/09/17 1836  08/11/17 0517 08/12/17 0454  WBC 3.1*   < > 3.3* 3.6*  NEUTROABS 1.7  --   --   --   HGB 11.2*   < > 10.0* 9.0*  HCT 32.2*   < > 28.7* 25.7*  MCV 98.8   < > 97.3 97.0  PLT 122*   < > 91* 85*   < > = values in this interval not displayed.   Recent Labs    08/09/17 1836  LABPROT 19.9*  INR 1.71      Assessment/Plan: - decompensated cirrhosis from chronic hepatitis C and prior alcohol use. MELD score 25 on admission  - Refractory/recurrence ascites. - Hepatocellular carcinoma. Status post blended embolization in May  2019 at [facility]. - Chronic anemia - acute kidney insufficiency : Improving   Recommendations --------------------------- - Abdomen remains distended despite of two large volume paracentesis.   - Some improvement in kidney  functions noted. Continue  albumin 100 g per day. - Increase Spironolactone to 25 mg and add lasix 20 mg. Repeat BMP in AM  - recommend supportive care for now.   - difficult situation because of decompensated cirrhosis and refractory ascites.now with elevation in kidney function which will prevent up titration of diuretics.meld score 25 points towards a poor long-term prognosis.Unfortunately, he has been taken off of the liver transplant list.  - Patient declined EGD to screen/treat for esophageal varices. His hemoglobin has been stable. - he is due for  contrasted imaging of the liver for f/up for [facility] but would hold off at this time because of acute kidney injury. - overall poor prognosis discussed with the patient. He verbalized understanding. - GI will follow     Otis Brace MD, [place] 08/12/2017, 3:24 PM  Contact #  [phone]

## 2017-08-12 NOTE — Progress Notes (Signed)
PROGRESS NOTE  Jerry Mcintyre:[id number] DOB: 1956/01/05 DOA: 08/09/2017 PCP: No primary care provider on file.  HPI/Recap of past 24 hours: Jerry Mcintyre is a 62 year old male with past medical history significant for advanced cirrhosis, hepatocellular carcinoma who presented to the ED with increasing abdominal distention and shortness of breath.  Found to be in anasarca with severe ascites.  08/10/2017: Patient seen and examined at his bedside.  He revealed last paracentesis was on May 13th 2019 at [facility]  Has been followed in the past by Dr. Alessandra Bevels who has been consulted and will see him during this admission.  Reports dyspnea at rest due to abdominal distention.  Paracentesis done this morning with 8 L of fluid removed by interventional radiology.  Fluid analysis pending.  08/11/17: Seen and examined at his bedside.  He has no new complaints.  Declines paracentesis today and would like to get it done tomorrow 08/12/2017.  08/12/17: Seen and examined at bedside. Reports pruritus but declines treatment for it.  States he rather gently scratch to relief discomfort. Abd distended. Paracentesis planned today. Agrees to paracentesis tomorrow, if indicated.  Assessment/Plan: Active Problems:   Anasarca   Chronic hepatitis C without hepatic coma (HCC)   Decompensated hepatic cirrhosis (HCC)   Bilateral lower extremity edema   ARF (acute renal failure) (HCC)   Protein-calorie malnutrition, severe   Palliative care by specialist   Goals of care, counseling/discussion  Decompensated liver cirrhosis with large ascites and anasarca Post paracentesis on 08/10/2017 by interventional radiology with 8 L of fluid removed and also on 08/12/17 with 8 L removed. Paracentesis planned again on 08/13/17 Rare WBCs present in body fluid-body culture no growth to date Continue spironolactone Lasix on hold to avoid hypotension Continue to monitor blood pressure GI following  Advanced liver cirrhosis with  concern for esophageal varices On IV ceftriaxone empirically Continue PPI Patient has declined EGD to screen/treat for esophageal varices No longer in the transplant list, unfortunately. GI following  Hyperbilirubinemia Trending down  Chronic thrombocytopenia Platelets 85 k from 91K from 113 K No sign of overt bleeding Repeat CBC in the morning  Pancytopenia Could be contributed by hemodilution WBC 3.6 k from 3.3 Hemoglobin 9 from 10 K Platelets 85 from 113 K  Hypervolemic hyponatremia Sodium 130 from 131 Less than 2g sodium diet  Anasarca Most likely secondary to hypoalbuminemia in the setting of advanced liver failure Diuretics  Low sodium diet <2gms  Noncompliance with medical management Patient needs reinforcement  CKD 3 Creatinine 1.60 from 1.69 from 1.78 Appears to be at baseline Avoid nephrotoxic agents Monitor urine output Repeat BMP in the morning  Generalized weakness/physical debility PT assessed no immediate needs at this time Fall precautions  Goals of care Palliative care following Patient is now DNR as of 08/11/17     Code Status: Change in Code Status from full code to DNR as of 08/11/2017  Family Communication: None at bedside  Disposition Plan: Home when clinically stable possibly tomorrow 08/13/17   Consultants:  GI  IR  Procedures:  Paracentesis done on 08/10/2017 and 08/12/17  Antimicrobials:  IV ceftriaxone  DVT prophylaxis: SCDs-Chemical DVT PPX contraindicated due to thrombocytopenia   Objective: Vitals:   08/12/17 1320 08/12/17 1328 08/12/17 1333 08/12/17 1355  BP: 103/73 107/74 110/79 102/74  Pulse:    84  Resp:    18  Temp:    97.9 F (36.6 C)  TempSrc:    Oral  SpO2:    95%  Weight:  Height:        Intake/Output Summary (Last 24 hours) at 08/12/2017 1447 Last data filed at 08/12/2017 0600 Gross per 24 hour  Intake 1075 ml  Output 225 ml  Net 850 ml   Filed Weights   08/09/17 2229 08/11/17 0518  08/12/17 0422  Weight: 88.7 kg (195 lb 8 oz) 84.1 kg (185 lb 4.8 oz) 84.7 kg (186 lb 11.2 oz)    Exam:  . General: 62 y.o. year-old male frail in NAD A&O x 3 . Cardiovascular: RRR no rubs or gallops. No JVD or thyromegaly. Marland Kitchen Respiratory: Clear to auscultation with no wheezes or rales. Good inspiratory effort. . Abdomen: Severely distended abdomen with hypoactive bowel sounds x4. . Musculoskeletal: 1+ pitting edema in lower extremities bilaterally. Marland Kitchen Psychiatry: Mood is appropriate for condition and setting   Data Reviewed: CBC: Recent Labs  Lab 08/09/17 1836 08/10/17 0506 08/11/17 0517 08/12/17 0454  WBC 3.1* 3.3* 3.3* 3.6*  NEUTROABS 1.7  --   --   --   HGB 11.2* 10.3* 10.0* 9.0*  HCT 32.2* 30.0* 28.7* 25.7*  MCV 98.8 98.7 97.3 97.0  PLT 122* 113* 91* 85*   Basic Metabolic Panel: Recent Labs  Lab 08/09/17 1836 08/10/17 0506 08/11/17 0517 08/12/17 0454  NA 135 131* 131* 130*  K 4.9 4.5 5.1 5.1  CL 102 99 101 99  CO2 26 25 24 25   GLUCOSE 100* 111* 107* 94  BUN 25* 25* 24* 22  CREATININE 1.82* 1.78* 1.69* 1.60*  CALCIUM 9.1 8.7* 8.5* 8.8*   GFR: Estimated Creatinine Clearance: 48.1 mL/min (A) (by C-G formula based on SCr of 1.6 mg/dL (H)). Liver Function Tests: Recent Labs  Lab 08/09/17 1836 08/10/17 0506 08/11/17 0517 08/12/17 0454  AST 67* 59* 50* 41  ALT 23 23 18 15   ALKPHOS 60 56 46 41  BILITOT 4.2* 4.0* 2.6* 2.1*  PROT 8.6* 8.0 7.1 7.2  ALBUMIN 3.5 3.4* 3.3* 4.5   Recent Labs  Lab 08/09/17 1836  LIPASE 48   Recent Labs  Lab 08/11/17 0517  AMMONIA 40*   Coagulation Profile: Recent Labs  Lab 08/09/17 1836  INR 1.71   Cardiac Enzymes: No results for input(s): CKTOTAL, CKMB, CKMBINDEX, TROPONINI in the last 168 hours. BNP (last 3 results) No results for input(s): PROBNP in the last 8760 hours. HbA1C: No results for input(s): HGBA1C in the last 72 hours. CBG: No results for input(s): GLUCAP in the last 168 hours. Lipid Profile: No  results for input(s): CHOL, HDL, LDLCALC, TRIG, CHOLHDL, LDLDIRECT in the last 72 hours. Thyroid Function Tests: No results for input(s): TSH, T4TOTAL, FREET4, T3FREE, THYROIDAB in the last 72 hours. Anemia Panel: No results for input(s): VITAMINB12, FOLATE, FERRITIN, TIBC, IRON, RETICCTPCT in the last 72 hours. Urine analysis:    Component Value Date/Time   COLORURINE AMBER (A) 08/10/2017 0524   APPEARANCEUR CLEAR 08/10/2017 0524   LABSPEC 1.020 08/10/2017 0524   PHURINE 5.0 08/10/2017 0524   GLUCOSEU NEGATIVE 08/10/2017 0524   HGBUR NEGATIVE 08/10/2017 0524   BILIRUBINUR NEGATIVE 08/10/2017 0524   KETONESUR NEGATIVE 08/10/2017 0524   PROTEINUR NEGATIVE 08/10/2017 0524   NITRITE NEGATIVE 08/10/2017 0524   LEUKOCYTESUR NEGATIVE 08/10/2017 0524   Sepsis Labs: @LABRCNTIP (procalcitonin:4,lacticidven:4)  ) Recent Results (from the past 240 hour(s))  Culture, body fluid-bottle     Status: None (Preliminary result)   Collection Time: 08/10/17 10:31 AM  Result Value Ref Range Status   Specimen Description PERITONEAL  Final   Special Requests NONE  Final   Culture   Final    NO GROWTH 2 DAYS Performed at [facility], [place] [address], [place], [place] [address]    Report Status PENDING  Incomplete  Gram stain     Status: None   Collection Time: 08/10/17 10:31 AM  Result Value Ref Range Status   Specimen Description PERITONEAL  Final   Special Requests NONE  Final   Gram Stain   Final    RARE WBC PRESENT, PREDOMINANTLY MONONUCLEAR NO ORGANISMS SEEN Performed at [facility], [address], [place], [place] [address]    Report Status 08/10/2017 FINAL  Final      Studies: US Paracentesis  Result Date: 08/12/2017 INDICATION: Patient with history of cirrhosis, hepatitis-C, hepatocellular carcinoma, acute renal insufficiency, recurrent ascites. Request made for therapeutic paracentesis up to 8 liters. EXAM: ULTRASOUND GUIDED THERAPEUTIC PARACENTESIS MEDICATIONS: None  COMPLICATIONS: None immediate. PROCEDURE: Informed written consent was obtained from the patient after a discussion of the risks, benefits and alternatives to treatment. A timeout was performed prior to the initiation of the procedure. Initial ultrasound scanning demonstrates a large amount of ascites within the right lower abdominal quadrant. The right lower abdomen was prepped and draped in the usual sterile fashion. 1% lidocaine was used for local anesthesia. Following this, a 19 gauge, 7-cm, Yueh catheter was introduced. An ultrasound image was saved for documentation purposes. The paracentesis was performed. The catheter was removed and a dressing was applied. The patient tolerated the procedure well without immediate post procedural complication. FINDINGS: A total of approximately 8 liters of golden yellow fluid was removed. IMPRESSION: Successful ultrasound-guided therapeutic paracentesis yielding 8 liters of peritoneal fluid. Read by: Rowe Robert, PA-C Electronically Signed   By: Jerilynn Mages.  Shick M.D.   On: 08/12/2017 14:10    Scheduled Meds: . lactose free nutrition  237 mL Oral TID BM  . lactulose  30 g Oral Daily  . pantoprazole  40 mg Oral Daily  . spironolactone  12.5 mg Oral Daily    Continuous Infusions: . albumin human 100 g (08/11/17 1955)  . cefTRIAXone (ROCEPHIN)  IV Stopped (08/12/17 9450)     LOS: 3 days     Kayleen Memos, MD [facility] Pager [phone]  If 7PM-7AM, please contact night-coverage [email or web address] Password [facility] 08/12/2017, 2:47 PM

## 2017-08-12 NOTE — Care Management Note (Signed)
Case Management Note  Patient Details  Name: Jerry Mcintyre MRN: [id number] Date of Birth: 11-21-1955  Subjective/Objective: Spoke to patient during rounds in rm w/rounding team about d/c plans-patient plans to d/c home-has caregiver services, has a sister & good support system-he wants to be as independent as possible-he declines any HHC, & community resources. Received CM referral to offer otpt palliative care services-patient declines-states he doen not need that since he has family that will be able to provide all the help he needs, & they are familiar with a lot of services. MD updated. No further CM needs.                   Action/Plan:d/c home.   Expected Discharge Date:  (unknown)               Expected Discharge Plan:  Home/Self Care  In-House Referral:     Discharge planning Services  CM Consult  Post Acute Care Choice:    Choice offered to:     DME Arranged:    DME Agency:     HH Arranged:    [place] Agency:     Status of Service:  Completed, signed off  If discussed at [employer] of Stay Meetings, dates discussed:    Additional Comments:  Dessa Phi, RN 08/12/2017, 11:22 AM

## 2017-08-13 ENCOUNTER — Inpatient Hospital Stay (HOSPITAL_COMMUNITY): Payer: Medicare Other

## 2017-08-13 LAB — CBC
HCT: 27.7 % — ABNORMAL LOW (ref 39.0–52.0)
Hemoglobin: 9.6 g/dL — ABNORMAL LOW (ref 13.0–17.0)
MCH: 34 pg (ref 26.0–34.0)
MCHC: 34.7 g/dL (ref 30.0–36.0)
MCV: 98.2 fL (ref 78.0–100.0)
Platelets: 82 10*3/uL — ABNORMAL LOW (ref 150–400)
RBC: 2.82 MIL/uL — ABNORMAL LOW (ref 4.22–5.81)
RDW: 14.8 % (ref 11.5–15.5)
WBC: 3.8 10*3/uL — ABNORMAL LOW (ref 4.0–10.5)

## 2017-08-13 LAB — BASIC METABOLIC PANEL
Anion gap: 6 (ref 5–15)
BUN: 22 mg/dL (ref 8–23)
CO2: 24 mmol/L (ref 22–32)
Calcium: 8.8 mg/dL — ABNORMAL LOW (ref 8.9–10.3)
Chloride: 101 mmol/L (ref 98–111)
Creatinine, Ser: 1.54 mg/dL — ABNORMAL HIGH (ref 0.61–1.24)
GFR calc Af Amer: 54 mL/min — ABNORMAL LOW (ref >60)
GFR calc non Af Amer: 46 mL/min — ABNORMAL LOW (ref >60)
Glucose, Bld: 117 mg/dL — ABNORMAL HIGH (ref 70–99)
Potassium: 4.8 mmol/L (ref 3.5–5.1)
Sodium: 131 mmol/L — ABNORMAL LOW (ref 135–145)

## 2017-08-13 MED ORDER — ALBUMIN HUMAN 25 % IV SOLN
50.0000 g | Freq: Four times a day (QID) | INTRAVENOUS | Status: AC
  Administered 2017-08-13: 50 g via INTRAVENOUS
  Administered 2017-08-13 – 2017-08-14 (×3): 12.5 g via INTRAVENOUS
  Administered 2017-08-14 (×4): 50 g via INTRAVENOUS
  Filled 2017-08-13 (×4): qty 200

## 2017-08-13 MED ORDER — LIDOCAINE HCL 1 % IJ SOLN
INTRAMUSCULAR | Status: AC
  Filled 2017-08-13: qty 10

## 2017-08-13 NOTE — Progress Notes (Signed)
PROGRESS NOTE  Jerry Mcintyre XVQ:[id number] DOB: 04/24/1955 DOA: 08/09/2017 PCP: No primary care provider on file.  HPI/Recap of past 24 hours: Jerry Mcintyre is a 62 year old male with past medical history significant for advanced cirrhosis, hepatocellular carcinoma who presented to the ED with increasing abdominal distention and shortness of breath.  Found to be in anasarca with severe ascites.  08/10/2017: Patient seen and examined at his bedside.  He revealed last paracentesis was on May 13th 2019 at [facility].  Has been followed in the past by Dr. Alessandra Bevels who has been consulted and will see him during this admission.  Reports dyspnea at rest due to abdominal distention.  Paracentesis done this morning with 8 L of fluid removed by interventional radiology.  Fluid analysis pending.  08/11/17: Seen and examined at his bedside.  He has no new complaints.  Declines paracentesis today and would like to get it done tomorrow 08/12/2017.  08/12/17: Seen and examined at bedside. Reports pruritus but declines treatment for it.  States he rather gently scratch to relief discomfort. Abd distended. Paracentesis planned today. Agrees to paracentesis tomorrow, if indicated.  08/13/17: Feels better. No new complaints. Possible paracentesis today.  Assessment/Plan: Active Problems:   Anasarca   Chronic hepatitis C without hepatic coma (HCC)   Decompensated hepatic cirrhosis (HCC)   Bilateral lower extremity edema   ARF (acute renal failure) (HCC)   Protein-calorie malnutrition, severe   Palliative care by specialist   Goals of care, counseling/discussion  Decompensated Hepatitis C liver cirrhosis with large ascites and anasarca Post paracentesis on 08/10/2017 by interventional radiology with 8 L of fluid removed and also on 08/12/17 with 8 L removed. Possible paracentesis on 08/13/17  Rare WBCs present in body fluid-body culture no growth to date Continue spironolactone Lasix on hold to avoid  hypotension Continue to monitor blood pressure GI following  Advanced liver cirrhosis with concern for esophageal varices On IV ceftriaxone empirically Continue PPI Patient has declined EGD to screen/treat for esophageal varices No longer in the transplant list, unfortunately. GI following  Hyperbilirubinemia Trending down  Chronic thrombocytopenia Platelets 82 k from 85 k from 91K from 113 K No sign of overt bleeding Repeat CBC in the morning  Pancytopenia Could be contributed by hemodilution  Hypervolemic hyponatremia Sodium 130 -131 Less than 2g sodium diet  Anasarca Most likely secondary to hypoalbuminemia in the setting of advanced liver failure Diuretics  Low sodium diet <2gms IV albumin daily  Noncompliance with medical management Patient needs reinforcement  CKD 3, improving Creatinine 1.54 from 1.60 from 1.69 from 1.78 Appears to be at baseline Avoid nephrotoxic agents Monitor urine output  Generalized weakness/physical debility PT assessed no immediate needs at this time Fall precautions  Goals of care Palliative care following Patient is now DNR as of 08/11/17     Code Status: Change in Code Status from full code to DNR as of 08/11/2017  Family Communication: None at bedside  Disposition Plan: Home when clinically stable possibly tomorrow 08/13/17   Consultants:  GI  IR  Procedures:  Paracentesis done on 08/10/2017 and 08/12/17  Antimicrobials:  IV ceftriaxone  DVT prophylaxis: SCDs-Chemical DVT PPX contraindicated due to thrombocytopenia   Objective: Vitals:   08/12/17 1333 08/12/17 1355 08/12/17 2043 08/13/17 0437  BP: 110/79 102/74 100/68 104/80  Pulse:  84 77 83  Resp:  18 16 18   Temp:  97.9 F (36.6 C) 98.5 F (36.9 C) 99 F (37.2 C)  TempSrc:  Oral Oral Oral  SpO2:  95% 94% 95%  Weight:    76.9 kg (169 lb 8.5 oz)  Height:        Intake/Output Summary (Last 24 hours) at 08/13/2017 1322 Last data filed at 08/13/2017  1100 Gross per 24 hour  Intake 826.67 ml  Output -  Net 826.67 ml   Filed Weights   08/11/17 0518 08/12/17 0422 08/13/17 0437  Weight: 84.1 kg (185 lb 4.8 oz) 84.7 kg (186 lb 11.2 oz) 76.9 kg (169 lb 8.5 oz)    Exam:  . General: 62 y.o. year-old male frail in no acute distress.  Alert and oriented x3. . Cardiovascular: Regular rate and rhythm with no rubs or gallops.  No JVD or thyromegaly noted. Marland Kitchen Respiratory: Clear to auscultation with no wheezes or rales. Good inspiratory effort. . Abdomen: Improved distention of abdomen with normal bowel sounds x4 quadrant. . Musculoskeletal: Improved 1+ pitting edema in lower extremities bilaterally.   Marland Kitchen Psychiatry: Mood is appropriate for condition and setting   Data Reviewed: CBC: Recent Labs  Lab 08/09/17 1836 08/10/17 0506 08/11/17 0517 08/12/17 0454 08/13/17 0429  WBC 3.1* 3.3* 3.3* 3.6* 3.8*  NEUTROABS 1.7  --   --   --   --   HGB 11.2* 10.3* 10.0* 9.0* 9.6*  HCT 32.2* 30.0* 28.7* 25.7* 27.7*  MCV 98.8 98.7 97.3 97.0 98.2  PLT 122* 113* 91* 85* 82*   Basic Metabolic Panel: Recent Labs  Lab 08/09/17 1836 08/10/17 0506 08/11/17 0517 08/12/17 0454 08/13/17 0429  NA 135 131* 131* 130* 131*  K 4.9 4.5 5.1 5.1 4.8  CL 102 99 101 99 101  CO2 26 25 24 25 24   GLUCOSE 100* 111* 107* 94 117*  BUN 25* 25* 24* 22 22  CREATININE 1.82* 1.78* 1.69* 1.60* 1.54*  CALCIUM 9.1 8.7* 8.5* 8.8* 8.8*   GFR: Estimated Creatinine Clearance: 49.9 mL/min (A) (by C-G formula based on SCr of 1.54 mg/dL (H)). Liver Function Tests: Recent Labs  Lab 08/09/17 1836 08/10/17 0506 08/11/17 0517 08/12/17 0454  AST 67* 59* 50* 41  ALT 23 23 18 15   ALKPHOS 60 56 46 41  BILITOT 4.2* 4.0* 2.6* 2.1*  PROT 8.6* 8.0 7.1 7.2  ALBUMIN 3.5 3.4* 3.3* 4.5   Recent Labs  Lab 08/09/17 1836  LIPASE 48   Recent Labs  Lab 08/11/17 0517  AMMONIA 40*   Coagulation Profile: Recent Labs  Lab 08/09/17 1836  INR 1.71   Cardiac Enzymes: No results  for input(s): CKTOTAL, CKMB, CKMBINDEX, TROPONINI in the last 168 hours. BNP (last 3 results) No results for input(s): PROBNP in the last 8760 hours. HbA1C: No results for input(s): HGBA1C in the last 72 hours. CBG: No results for input(s): GLUCAP in the last 168 hours. Lipid Profile: No results for input(s): CHOL, HDL, LDLCALC, TRIG, CHOLHDL, LDLDIRECT in the last 72 hours. Thyroid Function Tests: No results for input(s): TSH, T4TOTAL, FREET4, T3FREE, THYROIDAB in the last 72 hours. Anemia Panel: No results for input(s): VITAMINB12, FOLATE, FERRITIN, TIBC, IRON, RETICCTPCT in the last 72 hours. Urine analysis:    Component Value Date/Time   COLORURINE AMBER (A) 08/10/2017 0524   APPEARANCEUR CLEAR 08/10/2017 0524   LABSPEC 1.020 08/10/2017 0524   PHURINE 5.0 08/10/2017 [place] 08/10/2017 0524   HGBUR NEGATIVE 08/10/2017 0524   [place] NEGATIVE 08/10/2017 0524   KETONESUR NEGATIVE 08/10/2017 0524   PROTEINUR NEGATIVE 08/10/2017 0524   NITRITE NEGATIVE 08/10/2017 0524   LEUKOCYTESUR NEGATIVE 08/10/2017 0524  Sepsis Labs: @LABRCNTIP (procalcitonin:4,lacticidven:4)  ) Recent Results (from the past 240 hour(s))  Culture, body fluid-bottle     Status: None (Preliminary result)   Collection Time: 08/10/17 10:31 AM  Result Value Ref Range Status   Specimen Description PERITONEAL  Final   Special Requests NONE  Final   Culture   Final    NO GROWTH 2 DAYS Performed at [facility], [address], [place], [place] [address]    Report Status PENDING  Incomplete  Gram stain     Status: None   Collection Time: 08/10/17 10:31 AM  Result Value Ref Range Status   Specimen Description PERITONEAL  Final   Special Requests NONE  Final   Gram Stain   Final    RARE WBC PRESENT, PREDOMINANTLY MONONUCLEAR NO ORGANISMS SEEN Performed at [facility], [address], [place], [place] [address]    Report Status 08/10/2017 FINAL  Final      Studies: US  Paracentesis  Result Date: 08/12/2017 INDICATION: Patient with history of cirrhosis, hepatitis-C, hepatocellular carcinoma, acute renal insufficiency, recurrent ascites. Request made for therapeutic paracentesis up to 8 liters. EXAM: ULTRASOUND GUIDED THERAPEUTIC PARACENTESIS MEDICATIONS: None COMPLICATIONS: None immediate. PROCEDURE: Informed written consent was obtained from the patient after a discussion of the risks, benefits and alternatives to treatment. A timeout was performed prior to the initiation of the procedure. Initial ultrasound scanning demonstrates a large amount of ascites within the right lower abdominal quadrant. The right lower abdomen was prepped and draped in the usual sterile fashion. 1% lidocaine was used for local anesthesia. Following this, a 19 gauge, 7-cm, Yueh catheter was introduced. An ultrasound image was saved for documentation purposes. The paracentesis was performed. The catheter was removed and a dressing was applied. The patient tolerated the procedure well without immediate post procedural complication. FINDINGS: A total of approximately 8 liters of golden yellow fluid was removed. IMPRESSION: Successful ultrasound-guided therapeutic paracentesis yielding 8 liters of peritoneal fluid. Read by: Rowe Robert, PA-C Electronically Signed   By: Jerilynn Mages.  Shick M.D.   On: 08/12/2017 14:10    Scheduled Meds: . furosemide  20 mg Oral Daily  . lactose free nutrition  237 mL Oral TID BM  . lactulose  30 g Oral Daily  . lidocaine      . pantoprazole  40 mg Oral Daily  . spironolactone  25 mg Oral Daily    Continuous Infusions: . albumin human 12.5 g (08/12/17 2112)  . cefTRIAXone (ROCEPHIN)  IV Stopped (08/13/17 0530)     LOS: 4 days     Kayleen Memos, MD [facility] Pager [phone]  If 7PM-7AM, please contact night-coverage [email or web address] Password [email or web address] 08/13/2017, 1:22 PM

## 2017-08-14 LAB — CBC
HEMATOCRIT: 27.6 % — AB (ref 39.0–52.0)
HEMOGLOBIN: 9.7 g/dL — AB (ref 13.0–17.0)
MCH: 34.6 pg — AB (ref 26.0–34.0)
MCHC: 35.1 g/dL (ref 30.0–36.0)
MCV: 98.6 fL (ref 78.0–100.0)
Platelets: 87 10*3/uL — ABNORMAL LOW (ref 150–400)
RBC: 2.8 MIL/uL — AB (ref 4.22–5.81)
RDW: 14.6 % (ref 11.5–15.5)
WBC: 4 10*3/uL (ref 4.0–10.5)

## 2017-08-14 LAB — COMPREHENSIVE METABOLIC PANEL
ALT: 12 U/L (ref 0–44)
AST: 34 U/L (ref 15–41)
Albumin: 4.3 g/dL (ref 3.5–5.0)
Alkaline Phosphatase: 24 U/L — ABNORMAL LOW (ref 38–126)
Anion gap: 7 (ref 5–15)
BILIRUBIN TOTAL: 3.1 mg/dL — AB (ref 0.3–1.2)
BUN: 21 mg/dL (ref 8–23)
CALCIUM: 8.8 mg/dL — AB (ref 8.9–10.3)
CO2: 24 mmol/L (ref 22–32)
CREATININE: 1.49 mg/dL — AB (ref 0.61–1.24)
Chloride: 101 mmol/L (ref 98–111)
GFR, EST AFRICAN AMERICAN: 56 mL/min — AB (ref >60)
GFR, EST NON AFRICAN AMERICAN: 48 mL/min — AB (ref >60)
Glucose, Bld: 119 mg/dL — ABNORMAL HIGH (ref 70–99)
Potassium: 4.3 mmol/L (ref 3.5–5.1)
Sodium: 132 mmol/L — ABNORMAL LOW (ref 135–145)
TOTAL PROTEIN: 6.1 g/dL — AB (ref 6.5–8.1)

## 2017-08-14 LAB — AMMONIA: AMMONIA: 33 umol/L (ref 9–35)

## 2017-08-14 MED ORDER — LACTULOSE 10 GM/15ML PO SOLN
20.0000 g | Freq: Every day | ORAL | Status: DC
  Administered 2017-08-14 – 2017-08-16 (×3): 20 g via ORAL
  Filled 2017-08-14 (×3): qty 30

## 2017-08-14 MED ORDER — ALBUMIN HUMAN 25 % IV SOLN
50.0000 g | Freq: Four times a day (QID) | INTRAVENOUS | Status: AC
  Administered 2017-08-14 – 2017-08-15 (×6): 50 g via INTRAVENOUS
  Filled 2017-08-14 (×7): qty 200

## 2017-08-14 NOTE — Progress Notes (Signed)
PROGRESS NOTE  Jerry Mcintyre NAT:[id number] DOB: Mar 12, 1955 DOA: 08/09/2017 PCP: No primary care provider on file.  HPI/Recap of past 24 hours: [name] is a 62 year old male with past medical history significant for advanced cirrhosis, hepatocellular carcinoma who presented to the ED with increasing abdominal distention and shortness of breath.  Found to be in anasarca with severe ascites.  08/10/2017: Patient seen and examined at his bedside.  He revealed last paracentesis was on May 13th 2019 at [facility].  Has been followed in the past by Dr. Alessandra Bevels who has been consulted and will see him during this admission.  Reports dyspnea at rest due to abdominal distention.  Paracentesis done this morning with 8 L of fluid removed by interventional radiology.  Fluid analysis pending.  08/11/17: Seen and examined at his bedside.  He has no new complaints.  Declines paracentesis today and would like to get it done tomorrow 08/12/2017.  08/12/17: Seen and examined at bedside. Reports pruritus but declines treatment for it.  States he rather gently scratch to relief discomfort. Abd distended. Paracentesis planned today. Agrees to paracentesis tomorrow, if indicated.  08/13/17: Feels better. No new complaints. Paracentesis 11L fluid removed.  08/14/17: Reports feeling weak. Orthostatic vital signs ordered. Albumin restarted to help maintain is BP with goal MAP>65.   Assessment/Plan: Active Problems:   Anasarca   Chronic hepatitis C without hepatic coma (HCC)   Decompensated hepatic cirrhosis (HCC)   Bilateral lower extremity edema   ARF (acute renal failure) (HCC)   Protein-calorie malnutrition, severe   Palliative care by specialist   Goals of care, counseling/discussion  Decompensated Hepatitis C liver cirrhosis with large ascites and anasarca Post paracentesis on 08/10/2017 by interventional radiology with 8 L of fluid removed and also on 08/12/17 with 8 L removed. Paracentesis on 08/13/17 11.5L  fluid removed Rare WBCs present in body fluid-body culture no growth to date Continue spironolactone C/w lactulose Ammonia 33 Lasix on hold to avoid hypotension Continue to monitor blood pressure GI following  Sinus tach/bordeline hypotension Maintain MAP>65 C/w albumin q6H C/w monitoring vital signs  Severe Calorie malnutrition/physical debility/generalized weaknee Encouraged at bedside to increase calorie intake from solid foods Continue to avoid high salt containing foods Obtain orthostatic VS  Advanced liver cirrhosis with concern for esophageal varices On IV ceftriaxone empirically Continue PPI Patient has declined EGD to screen/treat for esophageal varices No longer in the transplant list, unfortunately. GI following  Hyperbilirubinemia Trending down  Chronic thrombocytopenia Platelets 87k from 82 k from 85 k from 91K from 113 K No sign of overt bleeding  Pancytopenia, improving Could be contributed by hemodilution  Hypervolemic hyponatremia, improving Sodium 132 from 130 Less than 2g sodium diet  Anasarca, resolving post 27 L fluid removed Most likely secondary to hypoalbuminemia in the setting of advanced liver failure Diuretics  Low sodium diet <2gms IV albumin q6 H  Noncompliance with medical management Patient needs reinforcement  CKD 3, improving Creatinine 1.49 from  1.54 from 1.60 from 1.69 from 1.78 Appears to be at baseline Avoid nephrotoxic agents Monitor urine output  Goals of care Palliative care following Patient is now DNR as of 08/11/17     Code Status: Change in Code Status from full code to DNR as of 08/11/2017  Family Communication: None at bedside  Disposition Plan: Home 08/15/17 if no events overnight  Consultants:  GI  IR  Procedures:  Paracentesis done on 08/10/2017, 08/12/17, and 08/13/17  Antimicrobials:  IV ceftriaxone  DVT prophylaxis: SCDs-Chemical DVT  PPX contraindicated due to  thrombocytopenia   Objective: Vitals:   08/13/17 2120 08/14/17 0442 08/14/17 1151 08/14/17 1327  BP: 96/67 95/65 99/68  106/73  Pulse: 80 68 78 71  Resp: 15 15    Temp:  98.5 F (36.9 C)  98.8 F (37.1 C)  TempSrc:  Oral  Oral  SpO2: 98% 96% 95% 93%  Weight:      Height:        Intake/Output Summary (Last 24 hours) at 08/14/2017 1612 Last data filed at 08/14/2017 1500 Gross per 24 hour  Intake 380 ml  Output 400 ml  Net -20 ml   Filed Weights   08/11/17 0518 08/12/17 0422 08/13/17 0437  Weight: 84.1 kg (185 lb 4.8 oz) 84.7 kg (186 lb 11.2 oz) 76.9 kg (169 lb 8.5 oz)    Exam:  . General: 62 y.o. year-old male frail NAD A&O x 3 . Cardiovascular: RRR no rubs or gallops. No JVD or thyromegaly . Respiratory: Clear to auscultation with no wheezes or rales. Good inspiratory effort. . Abdomen: Improved distention of abdomen with normal bowel sounds x4 quadrant. . Musculoskeletal: Improved Trace edema in lower extremities bilaterally.   Marland Kitchen Psychiatry: Mood is appropriate for condition and setting   Data Reviewed: CBC: Recent Labs  Lab 08/09/17 1836 08/10/17 0506 08/11/17 0517 08/12/17 0454 08/13/17 0429 08/14/17 0533  WBC 3.1* 3.3* 3.3* 3.6* 3.8* 4.0  NEUTROABS 1.7  --   --   --   --   --   HGB 11.2* 10.3* 10.0* 9.0* 9.6* 9.7*  HCT 32.2* 30.0* 28.7* 25.7* 27.7* 27.6*  MCV 98.8 98.7 97.3 97.0 98.2 98.6  PLT 122* 113* 91* 85* 82* 87*   Basic Metabolic Panel: Recent Labs  Lab 08/10/17 0506 08/11/17 0517 08/12/17 0454 08/13/17 0429 08/14/17 0533  NA 131* 131* 130* 131* 132*  K 4.5 5.1 5.1 4.8 4.3  CL 99 101 99 101 101  CO2 25 24 25 24 24   GLUCOSE 111* 107* 94 117* 119*  BUN 25* 24* 22 22 21   CREATININE 1.78* 1.69* 1.60* 1.54* 1.49*  CALCIUM 8.7* 8.5* 8.8* 8.8* 8.8*   GFR: Estimated Creatinine Clearance: 51.6 mL/min (A) (by C-G formula based on SCr of 1.49 mg/dL (H)). Liver Function Tests: Recent Labs  Lab 08/09/17 1836 08/10/17 0506 08/11/17 0517  08/12/17 0454 08/14/17 0533  AST 67* 59* 50* 41 34  ALT 23 23 18 15 12   ALKPHOS 60 56 46 41 24*  BILITOT 4.2* 4.0* 2.6* 2.1* 3.1*  PROT 8.6* 8.0 7.1 7.2 6.1*  ALBUMIN 3.5 3.4* 3.3* 4.5 4.3   Recent Labs  Lab 08/09/17 1836  LIPASE 48   Recent Labs  Lab 08/11/17 0517 08/14/17 0533  AMMONIA 40* 33   Coagulation Profile: Recent Labs  Lab 08/09/17 1836  INR 1.71   Cardiac Enzymes: No results for input(s): CKTOTAL, CKMB, CKMBINDEX, TROPONINI in the last 168 hours. BNP (last 3 results) No results for input(s): PROBNP in the last 8760 hours. HbA1C: No results for input(s): HGBA1C in the last 72 hours. CBG: No results for input(s): GLUCAP in the last 168 hours. Lipid Profile: No results for input(s): CHOL, HDL, LDLCALC, TRIG, CHOLHDL, LDLDIRECT in the last 72 hours. Thyroid Function Tests: No results for input(s): TSH, T4TOTAL, FREET4, T3FREE, THYROIDAB in the last 72 hours. Anemia Panel: No results for input(s): VITAMINB12, FOLATE, FERRITIN, TIBC, IRON, RETICCTPCT in the last 72 hours. Urine analysis:    Component Value Date/Time   COLORURINE AMBER (A) 08/10/2017  [place] 08/10/2017 0524   LABSPEC 1.020 08/10/2017 0524   PHURINE 5.0 08/10/2017 0524   GLUCOSEU NEGATIVE 08/10/2017 0524   HGBUR NEGATIVE 08/10/2017 0524   BILIRUBINUR NEGATIVE 08/10/2017 0524   KETONESUR NEGATIVE 08/10/2017 0524   PROTEINUR NEGATIVE 08/10/2017 0524   NITRITE NEGATIVE 08/10/2017 0524   LEUKOCYTESUR NEGATIVE 08/10/2017 0524   Sepsis Labs: @LABRCNTIP (procalcitonin:4,lacticidven:4)  ) Recent Results (from the past 240 hour(s))  Culture, body fluid-bottle     Status: None (Preliminary result)   Collection Time: 08/10/17 10:31 AM  Result Value Ref Range Status   Specimen Description PERITONEAL  Final   Special Requests NONE  Final   Culture   Final    NO GROWTH 3 DAYS Performed at [facility], [place] [address], [place], [place] [address]    Report Status  PENDING  Incomplete  Gram stain     Status: None   Collection Time: 08/10/17 10:31 AM  Result Value Ref Range Status   Specimen Description PERITONEAL  Final   Special Requests NONE  Final   Gram Stain   Final    RARE WBC PRESENT, PREDOMINANTLY MONONUCLEAR NO ORGANISMS SEEN Performed at [facility], [address], [place], [place] [address]    Report Status 08/10/2017 FINAL  Final      Studies: No results found.  Scheduled Meds: . furosemide  20 mg Oral Daily  . lactose free nutrition  237 mL Oral TID BM  . lactulose  20 g Oral Daily  . pantoprazole  40 mg Oral Daily  . spironolactone  25 mg Oral Daily    Continuous Infusions: . albumin human 50 g (08/14/17 1150)  . cefTRIAXone (ROCEPHIN)  IV Stopped (08/14/17 0825)     LOS: 5 days     Kayleen Memos, MD [facility] Pager [phone]  If 7PM-7AM, please contact night-coverage [email or web address] Password [facility] 08/14/2017, 4:12 PM

## 2017-08-14 NOTE — Progress Notes (Signed)
Pt was unable to urinate last night, decrease po intake, bladder scanner show 196 ml of urine. Pt dose not have the urge to go right now. I will continue to monitor.

## 2017-08-15 LAB — CULTURE, BODY FLUID W GRAM STAIN -BOTTLE: Culture: NO GROWTH

## 2017-08-15 LAB — CULTURE, BODY FLUID-BOTTLE

## 2017-08-15 NOTE — Progress Notes (Signed)
Physical Therapy Treatment Patient Details Name: Jerry Mcintyre MRN: [id number] DOB: 08-Jun-1955 Today's Date: 08/15/2017    History of Present Illness Pt is a 62 year old male with past medical history significant for advanced cirrhosis, hepatocellular carcinoma who presented to the ED with increasing abdominal distention and shortness of breath.  Found to be in anasarca with severe ascites.    PT Comments    Pt reports he has not ambulated in hallway since last PT visit and agreeable to ambulate again since he will likely d/c home tomorrow.  Pt ambulated good distance and appears steady.  Pt again encouraged to mobilize with nursing.    Follow Up Recommendations  No PT follow up     Equipment Recommendations  None recommended by PT    Recommendations for Other Services       Precautions / Restrictions Precautions Precautions: Fall    Mobility  Bed Mobility Overal bed mobility: Modified Independent                Transfers Overall transfer level: Needs assistance Equipment used: None Transfers: Sit to/from Stand Sit to Stand: Supervision            Ambulation/Gait Ambulation/Gait assistance: Supervision Gait Distance (Feet): 800 Feet Assistive device: None Gait Pattern/deviations: Step-through pattern;Decreased stride length     General Gait Details: pt pushed IV pole, steady gait observed, distance to tolerance, pt denies any symptoms   Stairs             Wheelchair Mobility    Modified Rankin (Stroke Patients Only)       Balance                                            Cognition Arousal/Alertness: Awake/alert Behavior During Therapy: WFL for tasks assessed/performed Overall Cognitive Status: Within Functional Limits for tasks assessed                                        Exercises      General Comments        Pertinent Vitals/Pain Pain Assessment: No/denies pain    Home Living                       Prior Function            PT Goals (current goals can now be found in the care plan section) Progress towards PT goals: Progressing toward goals    Frequency           PT Plan Current plan remains appropriate    Co-evaluation              AM-PAC PT "6 Clicks" Daily Activity  Outcome Measure  Difficulty turning over in bed (including adjusting bedclothes, sheets and blankets)?: None Difficulty moving from lying on back to sitting on the side of the bed? : None Difficulty sitting down on and standing up from a chair with arms (e.g., wheelchair, bedside commode, etc,.)?: None Help needed moving to and from a bed to chair (including a wheelchair)?: None Help needed walking in hospital room?: A Little Help needed climbing 3-5 steps with a railing? : A Little 6 Click Score: 22    End of Session   Activity Tolerance: Patient tolerated treatment  well Patient left: in bed;with call bell/phone within reach;with nursing/sitter in room   PT Visit Diagnosis: Difficulty in walking, not elsewhere classified (R26.2)     Time: 1059-1130 PT Time Calculation (min) (ACUTE ONLY): 31 min  Charges:  $Gait Training: 8-22 mins                    G Codes:       Carmelia Bake, PT, DPT 08/15/2017 Pager: [phone]  York Ram E 08/15/2017, 1:04 PM

## 2017-08-15 NOTE — Progress Notes (Signed)
PROGRESS NOTE  Jerry Mcintyre ZES:[id number] DOB: 09-24-1955 DOA: 08/09/2017 PCP: No primary care provider on file.  HPI/Recap of past 24 hours: Jerry Mcintyre is a 62 year old male with past medical history significant for advanced cirrhosis, hepatocellular carcinoma who presented to the ED with increasing abdominal distention and shortness of breath.  Found to be in anasarca with severe ascites.  08/10/2017: Patient seen and examined at his bedside.  He revealed last paracentesis was on May 13th 2019 at [facility].  Has been followed in the past by Dr. Alessandra Mcintyre who has been consulted and will see him during this admission.  Reports dyspnea at rest due to abdominal distention.  Paracentesis done this morning with 8 L of fluid removed by interventional radiology.  Fluid analysis pending.  08/11/17: Seen and examined at his bedside.  He has no new complaints.  Declines paracentesis today and would like to get it done tomorrow 08/12/2017.  08/12/17: Seen and examined at bedside. Reports pruritus but declines treatment for it.  States he rather gently scratch to relief discomfort. Abd distended. Paracentesis planned today. Agrees to paracentesis tomorrow, if indicated.  08/13/17: Feels better. No new complaints. Paracentesis 11L fluid removed.  08/14/17: Reports feeling weak. Orthostatic vital signs ordered. Albumin restarted to help maintain is BP with goal MAP>65.  08/15/2017: Patient seen and examined at his bedside.  He reports generalized weakness and poor oral intake.  Encouraged to increase his oral protein calorie intake.  Denies nausea or abdominal pain.  Continue IV albumin infusion until 6:00 tonight.  Discharge tomorrow if can tolerate oral nutrition.   Assessment/Plan: Active Problems:   Anasarca   Chronic hepatitis C without hepatic coma (HCC)   Decompensated hepatic cirrhosis (HCC)   Bilateral lower extremity edema   ARF (acute renal failure) (HCC)   Protein-calorie malnutrition, severe   Palliative care by specialist   Goals of care, counseling/discussion  Decompensated Hepatitis C liver cirrhosis with large ascites and anasarca Post paracentesis on 08/10/2017 by interventional radiology with 8 L of fluid removed and also on 08/12/17 with 8 L removed. Paracentesis on 08/13/17 11.5L fluid removed Presumed esophageal varices, completed 5 days of IV ceftriaxone Rare WBCs present in body fluid-body culture no growth to date Continue spironolactone C/w lactulose Ammonia 33 Lasix on hold to avoid hypotension Continue to monitor blood pressure  Generalized weakness/physical debility Encouraged to increase p.o. protein calorie intake PT recommends no PT follow-up  Sinus tach/bordeline hypotension, resolved  Advanced liver cirrhosis with concern for esophageal varices On IV ceftriaxone empirically Continue PPI Patient has declined EGD to screen/treat for esophageal varices No longer in the transplant list, unfortunately. Follow-up with GI outpatient  Hyperbilirubinemia, stable Follow-up with GI outpatient  Chronic thrombocytopenia Platelets 87k from 82 k from 85 k from 91K from 113 K No sign of overt bleeding Repeat CBC in the morning 08/16/2017  Pancytopenia, improving Could be contributed by hemodilution  Hypervolemic hyponatremia, improving Sodium 132 from 130 Less than 2g sodium diet Repeat BMP in the morning  Anasarca, resolving post 27 L fluid removed Most likely secondary to hypoalbuminemia in the setting of advanced liver failure Diuretics  Low sodium diet <2gms IV albumin q6 H  Noncompliance with medical management Patient needs reinforcement  CKD 3, improving Creatinine 1.49 from  1.54 from 1.60 from 1.69 from 1.78 Appears to be at baseline Avoid nephrotoxic agents Monitor urine output BMP on 08/16/2017  Goals of care Palliative care following Patient is now DNR as of 08/11/17  Code Status: Change in Code Status from full code to DNR  as of 08/11/2017  Family Communication: None at bedside  Disposition Plan: Home with [facility] on 08/16/2017 if can tolerate oral nutrition  Consultants:  GI  IR  Procedures:  Paracentesis done on 08/10/2017, 08/12/17, and 08/13/17  Antimicrobials:  None  DVT prophylaxis: SCDs-Chemical DVT PPX contraindicated due to thrombocytopenia   Objective: Vitals:   08/14/17 1756 08/14/17 2031 08/15/17 0518 08/15/17 1132  BP: 101/66 103/63 91/65 118/74  Pulse: 80 75 72 75  Resp:  11 12 18   Temp:  99.4 F (37.4 C) 99.3 F (37.4 C) 97.9 F (36.6 C)  TempSrc:  Oral Oral Oral  SpO2:  94% 95% 99%  Weight:   65 kg (143 lb 3.2 oz)   Height:        Intake/Output Summary (Last 24 hours) at 08/15/2017 1448 Last data filed at 08/15/2017 0800 Gross per 24 hour  Intake 680 ml  Output 650 ml  Net 30 ml   Filed Weights   08/12/17 0422 08/13/17 0437 08/15/17 0518  Weight: 84.7 kg (186 lb 11.2 oz) 76.9 kg (169 lb 8.5 oz) 65 kg (143 lb 3.2 oz)    Exam:  . General: 62 y.o. year-old male frail in no acute distress.  Alert and oriented x3. . Cardiovascular: Regular rate and rhythm with no rubs or gallops.  No JVD or thyromegaly noted.  Respiratory: Clear to auscultation with no wheezes or rales. Good inspiratory effort. . Abdomen: Improved distention of abdomen with normal bowel sounds x4 quadrant. . Musculoskeletal: Improved Trace edema in lower extremities bilaterally.   Marland Kitchen Psychiatry: Mood is appropriate for condition and setting   Data Reviewed: CBC: Recent Labs  Lab 08/09/17 1836 08/10/17 0506 08/11/17 0517 08/12/17 0454 08/13/17 0429 08/14/17 0533  WBC 3.1* 3.3* 3.3* 3.6* 3.8* 4.0  NEUTROABS 1.7  --   --   --   --   --   HGB 11.2* 10.3* 10.0* 9.0* 9.6* 9.7*  HCT 32.2* 30.0* 28.7* 25.7* 27.7* 27.6*  MCV 98.8 98.7 97.3 97.0 98.2 98.6  PLT 122* 113* 91* 85* 82* 87*   Basic Metabolic Panel: Recent Labs  Lab 08/10/17 0506 08/11/17 0517 08/12/17 0454 08/13/17 0429  08/14/17 0533  NA 131* 131* 130* 131* 132*  K 4.5 5.1 5.1 4.8 4.3  CL 99 101 99 101 101  CO2 25 24 25 24 24   GLUCOSE 111* 107* 94 117* 119*  BUN 25* 24* 22 22 21   CREATININE 1.78* 1.69* 1.60* 1.54* 1.49*  CALCIUM 8.7* 8.5* 8.8* 8.8* 8.8*   GFR: Estimated Creatinine Clearance: 46.7 mL/min (A) (by C-G formula based on SCr of 1.49 mg/dL (H)). Liver Function Tests: Recent Labs  Lab 08/09/17 1836 08/10/17 0506 08/11/17 0517 08/12/17 0454 08/14/17 0533  AST 67* 59* 50* 41 34  ALT 23 23 18 15 12   ALKPHOS 60 56 46 41 24*  BILITOT 4.2* 4.0* 2.6* 2.1* 3.1*  PROT 8.6* 8.0 7.1 7.2 6.1*  ALBUMIN 3.5 3.4* 3.3* 4.5 4.3   Recent Labs  Lab 08/09/17 1836  LIPASE 48   Recent Labs  Lab 08/11/17 0517 08/14/17 0533  AMMONIA 40* 33   Coagulation Profile: Recent Labs  Lab 08/09/17 1836  INR 1.71   Cardiac Enzymes: No results for input(s): CKTOTAL, CKMB, CKMBINDEX, TROPONINI in the last 168 hours. BNP (last 3 results) No results for input(s): PROBNP in the last 8760 hours. HbA1C: No results for input(s): HGBA1C in the last 72  hours. CBG: No results for input(s): GLUCAP in the last 168 hours. Lipid Profile: No results for input(s): CHOL, HDL, LDLCALC, TRIG, CHOLHDL, LDLDIRECT in the last 72 hours. Thyroid Function Tests: No results for input(s): TSH, T4TOTAL, FREET4, T3FREE, THYROIDAB in the last 72 hours. Anemia Panel: No results for input(s): VITAMINB12, FOLATE, FERRITIN, TIBC, IRON, RETICCTPCT in the last 72 hours. Urine analysis:    Component Value Date/Time   COLORURINE AMBER (A) 08/10/2017 0524   APPEARANCEUR CLEAR 08/10/2017 0524   LABSPEC 1.020 08/10/2017 0524   PHURINE 5.0 08/10/2017 0524   GLUCOSEU NEGATIVE 08/10/2017 0524   HGBUR NEGATIVE 08/10/2017 0524   BILIRUBINUR NEGATIVE 08/10/2017 0524   KETONESUR NEGATIVE 08/10/2017 0524   PROTEINUR NEGATIVE 08/10/2017 0524   NITRITE NEGATIVE 08/10/2017 0524   LEUKOCYTESUR NEGATIVE 08/10/2017 0524   Sepsis  Labs: @LABRCNTIP (procalcitonin:4,lacticidven:4)  ) Recent Results (from the past 240 hour(s))  Culture, body fluid-bottle     Status: None   Collection Time: 08/10/17 10:31 AM  Result Value Ref Range Status   Specimen Description PERITONEAL  Final   Special Requests NONE  Final   Culture   Final    NO GROWTH 5 DAYS Performed at [facility], [place] [address], [place], [place] [address]    Report Status 08/15/2017 FINAL  Final  Gram stain     Status: None   Collection Time: 08/10/17 10:31 AM  Result Value Ref Range Status   Specimen Description PERITONEAL  Final   Special Requests NONE  Final   Gram Stain   Final    RARE WBC PRESENT, PREDOMINANTLY MONONUCLEAR NO ORGANISMS SEEN Performed at [facility], [place] [address] [place], [place] [address]    Report Status 08/10/2017 FINAL  Final      Studies: No results found.  Scheduled Meds: . furosemide  20 mg Oral Daily  . lactose free nutrition  237 mL Oral TID BM  . lactulose  20 g Oral Daily  . pantoprazole  40 mg Oral Daily  . spironolactone  25 mg Oral Daily    Continuous Infusions: . albumin human 50 g (08/15/17 1213)     LOS: 6 days     Kayleen Memos, MD [facility] Pager [phone]  If 7PM-7AM, please contact night-coverage [email or web address] Password [facility] 08/15/2017, 2:48 PM

## 2017-08-15 NOTE — Progress Notes (Signed)
[facility] PROGRESS NOTE Subjective Patient is feeling better after paracentesis.  He has been seen by palliative care who has reinforced that he has a terminal disease and is gone over issues with the patient and his family.  He currently is a DNR.  Does not desire heroic interventions feeding tubes etc.  States that he probably is going home later today or tomorrow.  Objective: Vital signs in last 24 hours: Temp:  [98.8 F (37.1 C)-99.4 F (37.4 C)] 99.3 F (37.4 C) (07/15 0518) Pulse Rate:  [71-80] 72 (07/15 0518) Resp:  [11-12] 12 (07/15 0518) BP: (91-106)/(63-73) 91/65 (07/15 0518) SpO2:  [93 %-95 %] 95 % (07/15 0518) Weight:  [65 kg (143 lb 3.2 oz)] 65 kg (143 lb 3.2 oz) (07/15 0518) Last BM Date: 08/14/17  Intake/Output from previous day: 07/14 0701 - 07/15 0700 In: 720 [P.O.:120; IV Piggyback:600] Out: 800 [Urine:800] Intake/Output this shift: No intake/output data recorded.  PE:  General--alert white male answers questions appropriately Abdomen-- Slightly distended but no gross ascites Lab Results: Recent Labs    08/13/17 0429 08/14/17 0533  WBC 3.8* 4.0  HGB 9.6* 9.7*  HCT 27.7* 27.6*  PLT 82* 87*   BMET Recent Labs    08/13/17 0429 08/14/17 0533  NA 131* 132*  K 4.8 4.3  CL 101 101  CO2 24 24  CREATININE 1.54* 1.49*   LFT Recent Labs    08/14/17 0533  PROT 6.1*  AST 34  ALT 12  ALKPHOS 24*  BILITOT 3.1*   PT/INR No results for input(s): LABPROT, INR in the last 72 hours. PANCREAS No results for input(s): LIPASE in the last 72 hours.       Studies/Results: US Paracentesis  Result Date: 08/13/2017 INDICATION: Decompensated cirrhosis and ascites. EXAM: ULTRASOUND GUIDED PARACENTESIS MEDICATIONS: None. COMPLICATIONS: None immediate. PROCEDURE: Informed written consent was obtained from the patient after a discussion of the risks, benefits and alternatives to treatment. A timeout was performed prior to the initiation of the  procedure. Initial ultrasound was performed to localize ascites. The right lower abdomen was prepped and draped in the usual sterile fashion. 1% lidocaine was used for local anesthesia. Following this, a 6 Fr Safe-T-Centesis catheter was introduced. An ultrasound image was saved for documentation purposes. The paracentesis was performed. The catheter was removed and a dressing was applied. The patient tolerated the procedure well without immediate post procedural complication. FINDINGS: A total of approximately 11.5 L of amber colored fluid was removed. IMPRESSION: Successful ultrasound-guided paracentesis yielding 11.5 liters of peritoneal fluid. Electronically Signed   By: Aletta Edouard M.D.   On: 08/13/2017 15:05    Medications: I have reviewed the patient's current medications.  Assessment:   1.  End-stage liver disease.  Patient has hepatitis C and has developed [place] which has been ablated radiographically recently at [facility].  He is not a transplant candidate.  He has a MELD score of 25 which indicates a very poor prognosis.  He understands this and has been overall this with palliative care.  His main problem is managing his ascites and renal function.   Plan: Patient indicates that he is likely to go home in the next couple of days.  Would have him see Dr. Alessandra Bevels in the office 1 to 2 weeks after discharge.  Will be happy to see him again if needed.   Nancy Fetter 08/15/2017, 7:17 AM  This note was created using voice recognition software. Minor errors may Have occurred unintentionally.  Pager:  [phone] If no answer or after hours call [phone]

## 2017-08-15 NOTE — Progress Notes (Signed)
Report received from H. Holderness,RN. No change in assessment. Continue plan of care. Jacques Willingham Johnson 

## 2017-08-16 MED ORDER — FUROSEMIDE 20 MG PO TABS: 20.0000 mg | ORAL_TABLET | Freq: Every day | ORAL | 0 refills | Status: DC

## 2017-08-16 MED ORDER — SPIRONOLACTONE 25 MG PO TABS: 25.0000 mg | ORAL_TABLET | Freq: Every day | ORAL | 0 refills | Status: DC

## 2017-08-16 MED ORDER — BOOST PO LIQD: 237.0000 mL | Freq: Three times a day (TID) | ORAL | 0 refills | Status: DC

## 2017-08-16 MED ORDER — LACTULOSE 10 GM/15ML PO SOLN: 20.0000 g | Freq: Every day | ORAL | 0 refills | Status: AC

## 2017-08-16 MED ORDER — PANTOPRAZOLE SODIUM 40 MG PO TBEC: 40.0000 mg | DELAYED_RELEASE_TABLET | Freq: Every day | ORAL | 0 refills | Status: DC

## 2017-08-16 MED ORDER — LACTULOSE 10 GM/15ML PO SOLN: 20.0000 g | Freq: Every day | ORAL | 0 refills | Status: DC

## 2017-08-16 MED FILL — PANTOPRAZOLE SOD DR 40 MG T: 40 | 30 days supply | Qty: 30 | Fill #0

## 2017-08-16 MED FILL — SPIRONOLACTONE 25 MG TABS: 25 | 30 days supply | Qty: 30 | Fill #0

## 2017-08-16 MED FILL — LACTULOSE 10 GM/15 ML SOLN: 10 | 8 days supply | Qty: 240 | Fill #0

## 2017-08-16 MED FILL — FUROSEMIDE 20 MG TABS: 20 | 30 days supply | Qty: 30 | Fill #0

## 2017-08-16 NOTE — Discharge Summary (Addendum)
Discharge Summary  ADOLPHE FORTUNATO Mcintyre:[id number] DOB: 08/04/1955  PCP: No primary care provider on file.  Admit date: 08/09/2017 Discharge date: 08/16/2017  Time spent: 25 minutes  Recommendations for Outpatient Follow-up:  1. Take your medications as prescribed 2. Follow-up with GI 3. Continue outpatient palliative care   Discharge Diagnoses:  Active Hospital Problems   Diagnosis Date Noted  . Palliative care by specialist   . Goals of care, counseling/discussion   . Protein-calorie malnutrition, severe 08/10/2017  . Decompensated hepatic cirrhosis ([place]) 08/09/2017  . Bilateral lower extremity edema 08/09/2017  . ARF (acute renal failure) ([place]) 08/09/2017  . Chronic hepatitis C without hepatic coma ([place])   . Anasarca 01/05/2017    Resolved Hospital Problems  No resolved problems to display.    Discharge Condition: Stable  Diet recommendation: Low-sodium diet  Vitals:   08/16/17 0619 08/16/17 1305  BP: 100/70 109/73  Pulse: 68 74  Resp: 15 16  Temp: 98.8 F (37.1 C) 99.2 F (37.3 C)  SpO2: 95% 94%    History of present illness:   Jerry Mcintyre is a 62 year old male with past medical history significant for chronic hepatitis C, decompensated hepatic cirrhosis, hepatocellular carcinoma, CKD 3, cachexia, who presented to ED [facility] with increasing abdominal distention and shortness of breath.  Found to be in anasarca with severe ascites.  Hospital course complicated by persistent abdominal distention from his ascites.  Declined EGD to rule in/treat esophageal varices.  Treated with IV ceftriaxone x5 days which he tolerated well.  Also treated with lactulose, diuretics, IV albumin, and salt restriction less than 2 g daily.  3 paracentesis sessions done with improvement of abdominal distention.   Hypotension corrected with IV albumin which has now resolved.  08/16/17: Patient seen and examined at his bedside.  He has no new complaints.  On the day of discharge, the patient  was hemodynamically stable.  He will need to follow-up with GI and his PCP post hospitalization.   Hospital Course:  Active Problems:   Anasarca   Chronic hepatitis C without hepatic coma (HCC)   Decompensated hepatic cirrhosis (HCC)   Bilateral lower extremity edema   ARF (acute renal failure) (HCC)   Protein-calorie malnutrition, severe   Palliative care by specialist   Goals of care, counseling/discussion  Decompensated Hepatitis C liver cirrhosis with large ascites and anasarca Post paracentesis on 08/10/2017 with 8 L of fluid removed, on 08/12/17 with 8 L removed, also on 08/13/17 11.5L fluid removed Presumed esophageal varices, completed 5 days of IV ceftriaxone Rare WBCs present in body fluid-body culture no growth to date Continue spironolactone C/w lactulose [place]ia 33 Lasix on hold to avoid hypotension Follow-up with GI outpatient  Generalized weakness/physical debility Encouraged to increase p.o. protein calorie intake PT recommends no PT follow-up  Sinus tach/bordeline hypotension, resolved  Advanced liver cirrhosis with concern for esophageal varices Completed IV ceftriaxone empirically Continue PPI Patient has declined EGD to screen/treat for esophageal varices No longer in the transplant list, unfortunately. Follow-up with GI outpatient  Hyperbilirubinemia, stable Follow-up with GI outpatient  Chronic thrombocytopenia, stable Secondary to advanced liver failure No sign of overt bleeding  Pancytopenia, improved Follow-up with PCP and GI  Hypervolemic hyponatremia, improved Sodium 132 from 130 Continue low-sodium diet less than 2 g/day Follow-up with PCP  Anasarca, resolving post 27 L fluid removed Most likely secondary to hypoalbuminemia in the setting of advanced liver failure Diuretics  Low sodium diet <2gms IV albumin q6 H  Noncompliance, needs reinforcement  CKD  3, stable Creatinine 1.49 from 1.54 from 1.60 from 1.69 from  1.78 Appears to be at baseline Avoid nephrotoxic agents Monitor urine output  Goals of care Palliative care following Patient DNR as of 08/11/17 Continue palliative care outpatient   Procedures:  Paracentesis done on 08/10/2017, 08/12/2017, 08/13/2017.  Consultations:  GI  IR  Discharge Exam: BP 109/73 (BP Location: Right Arm)   Pulse 74   Temp 99.2 F (37.3 C) (Oral)   Resp 16   Ht 5' 9.5" (1.765 m)   Wt (!) 149.5 kg (329 lb 9.4 oz)   SpO2 94%   BMI 47.97 kg/m  . General: 62 y.o. year-old male frail, cachectic in no acute distress.  Alert and oriented x3. . Cardiovascular: Regular rate and rhythm with no rubs or gallops.  No thyromegaly or JVD noted.   Marland Kitchen Respiratory: Clear to auscultation with no wheezes or rales. Good inspiratory effort. . Abdomen: Soft nontender mildly distended with normal bowel sounds x4 quadrants. . Musculoskeletal: No lower extremity edema. 2/4 pulses in all 4 extremities. Marland Kitchen Psychiatry: Mood is appropriate for condition and setting  Discharge Instructions You were cared for by a hospitalist during your hospital stay. If you have any questions about your discharge medications or the care you received while you were in the hospital after you are discharged, you can call the unit and asked to speak with the hospitalist on call if the hospitalist that took care of you is not available. Once you are discharged, your primary care physician will handle any further medical issues. Please note that NO REFILLS for any discharge medications will be authorized once you are discharged, as it is imperative that you return to your primary care physician (or establish a relationship with a primary care physician if you do not have one) for your aftercare needs so that they can reassess your need for medications and monitor your lab values.   Allergies as of 08/16/2017      Reactions   Bee Venom       Medication List    STOP taking these medications    methocarbamol 500 MG tablet Commonly known as:  ROBAXIN   potassium chloride 20 MEQ packet Commonly known as:  KLOR-CON     TAKE these medications   furosemide 20 MG tablet Commonly known as:  LASIX Take 1 tablet (20 mg total) by mouth daily. Start taking on:  08/17/2017 What changed:    medication strength  how much to take  when to take this  additional instructions   lactose free nutrition Liqd Take 237 mLs by mouth 3 (three) times daily between meals.   lactulose 10 GM/15ML solution Commonly known as:  CHRONULAC Take 30 mLs (20 g total) by mouth daily. Start taking on:  08/17/2017   oxyCODONE 5 MG immediate release tablet Commonly known as:  Oxy IR/ROXICODONE TAKE 1 TABLET (5 MG TOTAL) BY MOUTH EVERY 3 (THREE) HOURS AS NEEDED FOR PAIN   pantoprazole 40 MG tablet Commonly known as:  PROTONIX Take 1 tablet (40 mg total) by mouth daily.   spironolactone 25 MG tablet Commonly known as:  ALDACTONE Take 1 tablet (25 mg total) by mouth daily. Start taking on:  08/17/2017 What changed:    medication strength  how much to take      Allergies  Allergen Reactions  . Bee Venom    Follow-up Information    Brahmbhatt, Parag, MD. Call in 1 day(s).   Specialty:  Gastroenterology Why:  Please  call for an appointment. Contact information: [place] [address] [phone]            The results of significant diagnostics from this hospitalization (including imaging, microbiology, ancillary and laboratory) are listed below for reference.    Significant Diagnostic Studies: Dg Chest 2 View  Result Date: 08/09/2017 CLINICAL DATA:  Dyspnea and abdominal distention. EXAM: CHEST - 2 VIEW COMPARISON:  None. FINDINGS: AP lordotic semi upright view of the chest demonstrates low lung volumes with right basilar atelectasis and/or scarring. Chronic mild elevation of the right hemidiaphragm. No pulmonary consolidation, effusion or pneumothorax.  Mild aortic atherosclerosis. Normal size cardiac silhouette. No acute osseous abnormality. IMPRESSION: Low lung volumes with right basilar atelectasis and/or scarring. Aortic atherosclerosis. No active pulmonary disease. Electronically Signed   By: Ashley Royalty M.D.   On: 08/09/2017 18:58   Dg Abd 1 View  Result Date: 08/10/2017 CLINICAL DATA:  Hepatitis-C and cirrhosis, abdominal distension, hypertension EXAM: ABDOMEN - 1 VIEW COMPARISON:  01/04/2017 FINDINGS: Increased attenuation of abdomen with medial displacement of bowel loops due to ascites. No bowel dilatation or bowel wall thickening. Bones demineralized with mild scattered degenerative disc disease changes of the lumbar spine. IMPRESSION: Diffuse ascites. Otherwise normal bowel gas pattern. Electronically Signed   By: Lavonia Dana M.D.   On: 08/10/2017 14:34   US Paracentesis  Result Date: 08/13/2017 INDICATION: Decompensated cirrhosis and ascites. EXAM: ULTRASOUND GUIDED PARACENTESIS MEDICATIONS: None. COMPLICATIONS: None immediate. PROCEDURE: Informed written consent was obtained from the patient after a discussion of the risks, benefits and alternatives to treatment. A timeout was performed prior to the initiation of the procedure. Initial ultrasound was performed to localize ascites. The right lower abdomen was prepped and draped in the usual sterile fashion. 1% lidocaine was used for local anesthesia. Following this, a 6 Fr Safe-T-Centesis catheter was introduced. An ultrasound image was saved for documentation purposes. The paracentesis was performed. The catheter was removed and a dressing was applied. The patient tolerated the procedure well without immediate post procedural complication. FINDINGS: A total of approximately 11.5 L of amber colored fluid was removed. IMPRESSION: Successful ultrasound-guided paracentesis yielding 11.5 liters of peritoneal fluid. Electronically Signed   By: Aletta Edouard M.D.   On: 08/13/2017 15:05   US  Paracentesis  Result Date: 08/12/2017 INDICATION: Patient with history of cirrhosis, hepatitis-C, hepatocellular carcinoma, acute renal insufficiency, recurrent ascites. Request made for therapeutic paracentesis up to 8 liters. EXAM: ULTRASOUND GUIDED THERAPEUTIC PARACENTESIS MEDICATIONS: None COMPLICATIONS: None immediate. PROCEDURE: Informed written consent was obtained from the patient after a discussion of the risks, benefits and alternatives to treatment. A timeout was performed prior to the initiation of the procedure. Initial ultrasound scanning demonstrates a large amount of ascites within the right lower abdominal quadrant. The right lower abdomen was prepped and draped in the usual sterile fashion. 1% lidocaine was used for local anesthesia. Following this, a 19 gauge, 7-cm, Yueh catheter was introduced. An ultrasound image was saved for documentation purposes. The paracentesis was performed. The catheter was removed and a dressing was applied. The patient tolerated the procedure well without immediate post procedural complication. FINDINGS: A total of approximately 8 liters of golden yellow fluid was removed. IMPRESSION: Successful ultrasound-guided therapeutic paracentesis yielding 8 liters of peritoneal fluid. Read by: Rowe Robert, PA-C Electronically Signed   By: Jerilynn Mages.  Shick M.D.   On: 08/12/2017 14:10   US Paracentesis  Result Date: 08/10/2017 INDICATION: Patient with history of Hep C and cirrhosis  who presents with recurrent ascites. Request is made for diagnostic and therapeutic paracentesis. Patient to receive albumin post-procedure. EXAM: ULTRASOUND GUIDED DIAGNOSTIC AND THERAPEUTIC PARACENTESIS MEDICATIONS: 10 mL 1% lidocaine COMPLICATIONS: None immediate. PROCEDURE: Informed written consent was obtained from the patient after a discussion of the risks, benefits and alternatives to treatment. A timeout was performed prior to the initiation of the procedure. Initial ultrasound scanning  demonstrates a large amount of ascites within the right lower abdominal quadrant. The right lower abdomen was prepped and draped in the usual sterile fashion. 1% lidocaine was used for local anesthesia. Following this, a 19 gauge, 7-cm, Yueh catheter was introduced. An ultrasound image was saved for documentation purposes. The paracentesis was performed. The catheter was removed and a dressing was applied. The patient tolerated the procedure well without immediate post procedural complication. FINDINGS: A total of approximately 8.0 liters of yellow fluid was removed. Samples were sent to the laboratory as requested by the clinical team. IMPRESSION: Successful ultrasound-guided diagnostic and therapeutic paracentesis yielding 8.0 liters of peritoneal fluid. Read by: Brynda Greathouse PA-C Electronically Signed   By: Aletta Edouard M.D.   On: 08/10/2017 11:27    Microbiology: Recent Results (from the past 240 hour(s))  Culture, body fluid-bottle     Status: None   Collection Time: 08/10/17 10:31 AM  Result Value Ref Range Status   Specimen Description PERITONEAL  Final   Special Requests NONE  Final   Culture   Final    NO GROWTH 5 DAYS Performed at [facility], [address] [place], [place] [address]    Report Status 08/15/2017 FINAL  Final  Gram stain     Status: None   Collection Time: 08/10/17 10:31 AM  Result Value Ref Range Status   Specimen Description PERITONEAL  Final   Special Requests NONE  Final   Gram Stain   Final    RARE WBC PRESENT, PREDOMINANTLY MONONUCLEAR NO ORGANISMS SEEN Performed at [facility], [place] [address] [place], [place] [address]    Report Status 08/10/2017 FINAL  Final     Labs: Basic Metabolic Panel: Recent Labs  Lab 08/10/17 0506 08/11/17 0517 08/12/17 0454 08/13/17 0429 08/14/17 0533  NA 131* 131* 130* 131* 132*  K 4.5 5.1 5.1 4.8 4.3  CL 99 101 99 101 101  CO2 25 24 25 24 24   GLUCOSE 111* 107* 94 117* 119*  BUN 25* 24* 22 22 21    CREATININE 1.78* 1.69* 1.60* 1.54* 1.49*  CALCIUM 8.7* 8.5* 8.8* 8.8* 8.8*   Liver Function Tests: Recent Labs  Lab 08/09/17 1836 08/10/17 0506 08/11/17 0517 08/12/17 0454 08/14/17 0533  AST 67* 59* 50* 41 34  ALT 23 23 18 15 12   ALKPHOS 60 56 46 41 24*  BILITOT 4.2* 4.0* 2.6* 2.1* 3.1*  PROT 8.6* 8.0 7.1 7.2 6.1*  ALBUMIN 3.5 3.4* 3.3* 4.5 4.3   Recent Labs  Lab 08/09/17 1836  LIPASE 48   Recent Labs  Lab 08/11/17 0517 08/14/17 0533  AMMONIA 40* 33   CBC: Recent Labs  Lab 08/09/17 1836 08/10/17 0506 08/11/17 0517 08/12/17 0454 08/13/17 0429 08/14/17 0533  WBC 3.1* 3.3* 3.3* 3.6* 3.8* 4.0  NEUTROABS 1.7  --   --   --   --   --   HGB 11.2* 10.3* 10.0* 9.0* 9.6* 9.7*  HCT 32.2* 30.0* 28.7* 25.7* 27.7* 27.6*  MCV 98.8 98.7 97.3 97.0 98.2 98.6  PLT 122* 113* 91* 85* 82* 87*  Cardiac Enzymes: No results for input(s): CKTOTAL, CKMB, CKMBINDEX, TROPONINI in the last 168 hours. BNP: BNP (last 3 results) Recent Labs    01/05/17 0035  BNP 105.2*    ProBNP (last 3 results) No results for input(s): PROBNP in the last 8760 hours.  CBG: No results for input(s): GLUCAP in the last 168 hours.     Signed:  Kayleen Memos, MD [facility] 08/16/2017, 2:16 PM

## 2017-08-16 NOTE — Care Management Note (Signed)
Case Management Note  Patient Details  Name: Jerry Mcintyre MRN: [id number] Date of Birth: 08-Apr-1955  Subjective/Objective:Noted ordered for HHC-patient pleasantly declines. No further CM needs.                    Action/Plan:d/c home.   Expected Discharge Date:  08/16/17               Expected Discharge Plan:  Home/Self Care  In-House Referral:     Discharge planning Services  CM Consult  Post Acute Care Choice:    Choice offered to:     DME Arranged:    DME Agency:     HH Arranged:  Patient Refused [place] Agency:     Status of Service:  Completed, signed off  If discussed at [employer] of Stay Meetings, dates discussed:    Additional Comments:  Dessa Phi, RN 08/16/2017, 3:36 PM

## 2017-08-16 NOTE — Discharge Instructions (Signed)
Edema Edema is when you have too much fluid in your body or under your skin. Edema may make your legs, feet, and ankles swell up. Swelling is also common in looser tissues, like around your eyes. This is a common condition. It gets more common as you get older. There are many possible causes of edema. Eating too much salt (sodium) and being on your feet or sitting for a long time can cause edema in your legs, feet, and ankles. Hot weather may make edema worse. Edema is usually painless. Your skin may look swollen or shiny. Follow these instructions at home:  Keep the swollen body part raised (elevated) above the level of your heart when you are sitting or lying down.  Do not sit still or stand for a long time.  Do not wear tight clothes. Do not wear garters on your upper legs.  Exercise your legs. This can help the swelling go down.  Wear elastic bandages or support stockings as told by your doctor.  Eat a low-salt (low-sodium) diet to reduce fluid as told by your doctor.  Depending on the cause of your swelling, you may need to limit how much fluid you drink (fluid restriction).  Take over-the-counter and prescription medicines only as told by your doctor. Contact a doctor if:  Treatment is not working.  You have heart, liver, or kidney disease and have symptoms of edema.  You have sudden and unexplained weight gain. Get help right away if:  You have shortness of breath or chest pain.  You cannot breathe when you lie down.  You have pain, redness, or warmth in the swollen areas.  You have heart, liver, or kidney disease and get edema all of a sudden.  You have a fever and your symptoms get worse all of a sudden. Summary  Edema is when you have too much fluid in your body or under your skin.  Edema may make your legs, feet, and ankles swell up. Swelling is also common in looser tissues, like around your eyes.  Raise (elevate) the swollen body part above the level of your  heart when you are sitting or lying down.  Follow your doctor's instructions about diet and how much fluid you can drink (fluid restriction). This information is not intended to replace advice given to you by your health care provider. Make sure you discuss any questions you have with your health care provider. Document Released: 07/07/2007 Document Revised: 02/06/2016 Document Reviewed: 02/06/2016 Elsevier Interactive Patient Education  2017 [place].  Paracentesis, Care After Refer to this sheet in the next few weeks. These instructions provide you with information about caring for yourself after your procedure. Your health care provider may also give you more specific instructions. Your treatment has been planned according to current medical practices, but problems sometimes occur. Call your health care provider if you have any problems or questions after your procedure. What can I expect after the procedure? After your procedure, it is common to have a small amount of clear fluid coming from the puncture site. Follow these instructions at home:  Return to your normal activities as told by your health care provider. Ask your health care provider what activities are safe for you.  Take over-the-counter and prescription medicines only as told by your health care provider.  Do not take baths, swim, or use a hot tub until your health care provider approves.  Follow instructions from your health care provider about: ? How to take care of  your puncture site. ? When and how you should change your bandage (dressing). ? When you should remove your dressing.  Check your puncture area every day signs of infection. Watch for: ? Redness, swelling, or pain. ? Fluid, blood, or pus.  Keep all follow-up visits as told by your health care provider. This is important. Contact a health care provider if:  You have redness, swelling, or pain at your puncture site.  You start to have more clear  fluid coming from your puncture site.  You have blood or pus coming from your puncture site.  You have chills.  You have a fever. Get help right away if:  You develop chest pain or shortness of breath.  You develop increasing pain, discomfort, or swelling in your abdomen.  You feel dizzy or light-headed or you pass out. This information is not intended to replace advice given to you by your health care provider. Make sure you discuss any questions you have with your health care provider. Document Released: 06/04/2014 Document Revised: 06/26/2015 Document Reviewed: 04/02/2014 Elsevier Interactive Patient Education  2018 [employer].   Peripheral Edema Peripheral edema is swelling that is caused by a buildup of fluid. Peripheral edema most often affects the lower legs, ankles, and feet. It can also develop in the arms, hands, and face. The area of the body that has peripheral edema will look swollen. It may also feel heavy or warm. Your clothes may start to feel tight. Pressing on the area may make a temporary dent in your skin. You may not be able to move your arm or leg as much as usual. There are many causes of peripheral edema. It can be a complication of other diseases, such as congestive heart failure, kidney disease, or a problem with your blood circulation. It also can be a side effect of certain medicines. It often happens to women during pregnancy. Sometimes, the cause is not known. Treating the underlying condition is often the only treatment for peripheral edema. Follow these instructions at home: Pay attention to any changes in your symptoms. Take these actions to help with your discomfort:  Raise (elevate) your legs while you are sitting or lying down.  Move around often to prevent stiffness and to lessen swelling. Do not sit or stand for long periods of time.  Wear support stockings as told by your health care provider.  Follow instructions from your health care provider  about limiting salt (sodium) in your diet. Sometimes eating less salt can reduce swelling.  Take over-the-counter and prescription medicines only as told by your health care provider. Your health care provider may prescribe medicine to help your body get rid of excess water (diuretic).  Keep all follow-up visits as told by your health care provider. This is important.  Contact a health care provider if:  You have a fever.  Your edema starts suddenly or is getting worse, especially if you are pregnant or have a medical condition.  You have swelling in only one leg.  You have increased swelling and pain in your legs. Get help right away if:  You develop shortness of breath, especially when you are lying down.  You have pain in your chest or abdomen.  You feel weak.  You faint. This information is not intended to replace advice given to you by your health care provider. Make sure you discuss any questions you have with your health care provider. Document Released: 02/26/2004 Document Revised: 06/23/2015 Document Reviewed: 07/31/2014 Elsevier Interactive Patient  Education © 2018 Elsevier Inc. ° °

## 2017-08-16 NOTE — Progress Notes (Signed)
Nutrition Follow-up  DOCUMENTATION CODES:   Severe malnutrition in context of chronic illness  INTERVENTION:  - Continue Boost Plus TID. - Continue to encourage PO intakes.  - If medically appropriate and if within GOC/POC, recommend trial of appetite stimulant.    NUTRITION DIAGNOSIS:   Severe Malnutrition related to chronic illness, catabolic illness, cancer and cancer related treatments as evidenced by severe fat depletion, severe muscle depletion, edema. -ongoing  GOAL:   Patient will meet greater than or equal to 90% of their needs -unmet  MONITOR:   PO intake, Supplement acceptance, Weight trends, Labs, I & O's  ASSESSMENT:   62 y.o. male with history of hepatitis C with cirrhosis who has had recent embolization on Jun 16, 2017 at [facility] for hepatocellular carcinoma presents to the ER with increasing abdominal distention shortness of breath and peripheral edema gradually developing over the last 3 weeks.  Patient states he stopped taking all his diuretics last 3 weeks ago since he thought it was not helping him.  Has abdominal distention but denies any acute pain or any nausea vomiting or diarrhea.   Weight trending down since admission following 2 paracenteses (7/10 and 7/12, both with 8L removed--max ordered). Most recent documented intake was 25% of breakfast yesterday. Lunch tray at bedside at this time and patient reports that he consumed a few bites each of salad and chocolate pudding. He does not feel up to/does not want to try to eat any more at this time. He states that appetite has been poor throughout hospitalization and he is having a difficult time eating more than a few bites.   He reports that he recently got medication and is wanting to rest. Unable to obtain further information at this time as patient wanted to rest.  Dr. Juel Burrow note from yesterday stated possible home today with [facility] if patient able to tolerate oral nutrition. Noted that Palliative  Care saw patient on 7/11 and patient was made DNR/DNI at that time.   Medications reviewed; 20 mg oral Lasix/day, 20 g lactulose/day, 40 mg oral Protonix/day, 25 mg Aldactone/day.  Labs reviewed; Na: 132 mmol/L, creatinine: 1.49 mg/dL, Ca: 8.8 mg/dL, Alk Phos low.     Diet Order:   Diet Order           Diet 2 gram sodium Room service appropriate? Yes; Fluid consistency: Thin  Diet effective now          EDUCATION NEEDS:   No education needs have been identified at this time  Skin:  Skin Assessment: Reviewed RN Assessment  Last BM:  7/16  Height:   Ht Readings from Last 1 Encounters:  08/09/17 5' 9.5" (1.765 m)    Weight:   Wt Readings from Last 1 Encounters:  08/16/17 (!) 329 lb 9.4 oz (149.5 kg)    Ideal Body Weight:  74.09 kg  BMI:  Body mass index is 47.97 kg/m.  Estimated Nutritional Needs:   Kcal:  5427-0623 (25-28 kcal/kg)  Protein:  115-127 grams (1.5-1.7 grams/kg)  Fluid:  >/= 1.5 L/day     Jarome Matin, MS, RD, LDN, [facility] Inpatient Clinical Dietitian Pager # [phone] After hours/weekend pager # [phone]

## 2017-08-16 NOTE — Care Management Important Message (Signed)
Important Message  Patient Details  Name: Jerry Mcintyre MRN: [id number] Date of Birth: April 24, 1955   Medicare Important Message Given:  Yes    Kerin Salen 08/16/2017, 1:06 [place] Message  Patient Details  Name: Jerry Mcintyre MRN: [id number] Date of Birth: [birth year]   Medicare Important Message Given:  Yes    Kerin Salen 08/16/2017, 1:06 PM

## 2017-08-16 NOTE — Care Management Note (Signed)
Case Management Note  Patient Details  Name: Jerry Mcintyre MRN: [id number] Date of Birth: 15-Nov-1955  Subjective/Objective: Patient declines any HHC services-states he has all the support needed @ home. No CM needs.                   Action/Plan:d/c home.   Expected Discharge Date:  (unknown)               Expected Discharge Plan:  Home/Self Care  In-House Referral:     Discharge planning Services  CM Consult  Post Acute Care Choice:    Choice offered to:     DME Arranged:    DME Agency:     HH Arranged:    [place] Agency:     Status of Service:  Completed, signed off  If discussed at [employer] of Stay Meetings, dates discussed:    Additional Comments:  Dessa Phi, RN 08/16/2017, 12:12 PM

## 2017-10-08 ENCOUNTER — Inpatient Hospital Stay (HOSPITAL_COMMUNITY)
Admission: EM | Admit: 2017-10-08 | Discharge: 2017-10-18 | DRG: 441 | Disposition: A | Discharge status: Hospice | Payer: Medicare Other | Attending: Internal Medicine | Admitting: Internal Medicine

## 2017-10-08 ENCOUNTER — Emergency Department (HOSPITAL_COMMUNITY): Payer: Medicare Other

## 2017-10-08 ENCOUNTER — Other Ambulatory Visit: Payer: Self-pay

## 2017-10-08 ENCOUNTER — Encounter (HOSPITAL_COMMUNITY): Payer: Self-pay | Admitting: Emergency Medicine

## 2017-10-08 DIAGNOSIS — N179 Acute kidney failure, unspecified: Secondary | ICD-10-CM | POA: Diagnosis present

## 2017-10-08 DIAGNOSIS — Z8505 Personal history of malignant neoplasm of liver: Secondary | ICD-10-CM

## 2017-10-08 DIAGNOSIS — N183 Chronic kidney disease, stage 3 (moderate): Secondary | ICD-10-CM | POA: Diagnosis present

## 2017-10-08 DIAGNOSIS — E875 Hyperkalemia: Secondary | ICD-10-CM | POA: Diagnosis not present

## 2017-10-08 DIAGNOSIS — D6959 Other secondary thrombocytopenia: Secondary | ICD-10-CM | POA: Diagnosis present

## 2017-10-08 DIAGNOSIS — R0602 Shortness of breath: Secondary | ICD-10-CM

## 2017-10-08 DIAGNOSIS — Z66 Do not resuscitate: Secondary | ICD-10-CM | POA: Diagnosis present

## 2017-10-08 DIAGNOSIS — B182 Chronic viral hepatitis C: Principal | ICD-10-CM | POA: Diagnosis present

## 2017-10-08 DIAGNOSIS — Z515 Encounter for palliative care: Secondary | ICD-10-CM | POA: Diagnosis not present

## 2017-10-08 DIAGNOSIS — D696 Thrombocytopenia, unspecified: Secondary | ICD-10-CM | POA: Diagnosis present

## 2017-10-08 DIAGNOSIS — E8770 Fluid overload, unspecified: Secondary | ICD-10-CM | POA: Diagnosis present

## 2017-10-08 DIAGNOSIS — E871 Hypo-osmolality and hyponatremia: Secondary | ICD-10-CM | POA: Diagnosis present

## 2017-10-08 DIAGNOSIS — R601 Generalized edema: Secondary | ICD-10-CM | POA: Diagnosis not present

## 2017-10-08 DIAGNOSIS — K7460 Unspecified cirrhosis of liver: Secondary | ICD-10-CM | POA: Diagnosis present

## 2017-10-08 DIAGNOSIS — Z7189 Other specified counseling: Secondary | ICD-10-CM | POA: Diagnosis not present

## 2017-10-08 DIAGNOSIS — K7290 Hepatic failure, unspecified without coma: Secondary | ICD-10-CM | POA: Diagnosis present

## 2017-10-08 DIAGNOSIS — R338 Other retention of urine: Secondary | ICD-10-CM

## 2017-10-08 DIAGNOSIS — R188 Other ascites: Secondary | ICD-10-CM | POA: Diagnosis present

## 2017-10-08 DIAGNOSIS — I959 Hypotension, unspecified: Secondary | ICD-10-CM | POA: Diagnosis not present

## 2017-10-08 DIAGNOSIS — R140 Abdominal distension (gaseous): Secondary | ICD-10-CM

## 2017-10-08 DIAGNOSIS — I129 Hypertensive chronic kidney disease with stage 1 through stage 4 chronic kidney disease, or unspecified chronic kidney disease: Secondary | ICD-10-CM | POA: Diagnosis present

## 2017-10-08 DIAGNOSIS — Z87891 Personal history of nicotine dependence: Secondary | ICD-10-CM | POA: Diagnosis not present

## 2017-10-08 DIAGNOSIS — D61818 Other pancytopenia: Secondary | ICD-10-CM | POA: Diagnosis present

## 2017-10-08 DIAGNOSIS — R64 Cachexia: Secondary | ICD-10-CM | POA: Diagnosis present

## 2017-10-08 DIAGNOSIS — R339 Retention of urine, unspecified: Secondary | ICD-10-CM | POA: Diagnosis not present

## 2017-10-08 DIAGNOSIS — E43 Unspecified severe protein-calorie malnutrition: Secondary | ICD-10-CM | POA: Diagnosis present

## 2017-10-08 DIAGNOSIS — Z6823 Body mass index (BMI) 23.0-23.9, adult: Secondary | ICD-10-CM

## 2017-10-08 DIAGNOSIS — Z91030 Bee allergy status: Secondary | ICD-10-CM

## 2017-10-08 LAB — COMPREHENSIVE METABOLIC PANEL
ALBUMIN: 3.2 g/dL — AB (ref 3.5–5.0)
ALK PHOS: 65 U/L (ref 38–126)
ALT: 25 U/L (ref 0–44)
ANION GAP: 10 (ref 5–15)
AST: 73 U/L — AB (ref 15–41)
BUN: 40 mg/dL — ABNORMAL HIGH (ref 8–23)
CALCIUM: 9 mg/dL (ref 8.9–10.3)
CO2: 20 mmol/L — AB (ref 22–32)
Chloride: 96 mmol/L — ABNORMAL LOW (ref 98–111)
Creatinine, Ser: UNDETERMINED mg/dL (ref 0.61–1.24)
GLUCOSE: 116 mg/dL — AB (ref 70–99)
Potassium: 5.8 mmol/L — ABNORMAL HIGH (ref 3.5–5.1)
SODIUM: 126 mmol/L — AB (ref 135–145)
Total Bilirubin: 3.4 mg/dL — ABNORMAL HIGH (ref 0.3–1.2)
Total Protein: 7.6 g/dL (ref 6.5–8.1)

## 2017-10-08 LAB — CBC WITH DIFFERENTIAL/PLATELET
BASOS PCT: 2 %
Basophils Absolute: 0.1 10*3/uL (ref 0.0–0.1)
EOS PCT: 3 %
Eosinophils Absolute: 0.1 10*3/uL (ref 0.0–0.7)
HCT: 26.3 % — ABNORMAL LOW (ref 39.0–52.0)
HEMOGLOBIN: 9.2 g/dL — AB (ref 13.0–17.0)
Lymphocytes Relative: 16 %
Lymphs Abs: 0.6 10*3/uL — ABNORMAL LOW (ref 0.7–4.0)
MCH: 33 pg (ref 26.0–34.0)
MCHC: 35 g/dL (ref 30.0–36.0)
MCV: 94.3 fL (ref 78.0–100.0)
MONO ABS: 0.7 10*3/uL (ref 0.1–1.0)
Monocytes Relative: 17 %
NEUTROS PCT: 62 %
Neutro Abs: 2.5 10*3/uL (ref 1.7–7.7)
PLATELETS: 93 10*3/uL — AB (ref 150–400)
RBC: 2.79 MIL/uL — ABNORMAL LOW (ref 4.22–5.81)
RDW: 15.5 % (ref 11.5–15.5)
WBC: 4 10*3/uL (ref 4.0–10.5)

## 2017-10-08 LAB — BASIC METABOLIC PANEL
ANION GAP: 8 (ref 5–15)
BUN: 41 mg/dL — ABNORMAL HIGH (ref 8–23)
CHLORIDE: 96 mmol/L — AB (ref 98–111)
CO2: 22 mmol/L (ref 22–32)
Calcium: 9 mg/dL (ref 8.9–10.3)
Creatinine, Ser: 2.8 mg/dL — ABNORMAL HIGH (ref 0.61–1.24)
GFR calc Af Amer: 26 mL/min — ABNORMAL LOW (ref >60)
GFR calc non Af Amer: 22 mL/min — ABNORMAL LOW (ref >60)
Glucose, Bld: 118 mg/dL — ABNORMAL HIGH (ref 70–99)
POTASSIUM: 5.2 mmol/L — AB (ref 3.5–5.1)
Sodium: 126 mmol/L — ABNORMAL LOW (ref 135–145)

## 2017-10-08 LAB — MAGNESIUM: Magnesium: 2.4 mg/dL (ref 1.7–2.4)

## 2017-10-08 LAB — BRAIN NATRIURETIC PEPTIDE: B Natriuretic Peptide: 92.9 pg/mL (ref 0.0–100.0)

## 2017-10-08 LAB — I-STAT CG4 LACTIC ACID, ED: Lactic Acid, Venous: 1.72 mmol/L (ref 0.5–1.9)

## 2017-10-08 LAB — CREATININE, SERUM
Creatinine, Ser: 2.76 mg/dL — ABNORMAL HIGH (ref 0.61–1.24)
GFR calc Af Amer: 27 mL/min — ABNORMAL LOW (ref >60)
GFR calc non Af Amer: 23 mL/min — ABNORMAL LOW (ref >60)

## 2017-10-08 LAB — PROTIME-INR
INR: 1.29
PROTHROMBIN TIME: 15.9 s — AB (ref 11.4–15.2)

## 2017-10-08 MED ORDER — SODIUM CHLORIDE 0.9 % IV SOLN
250.0000 mL | INTRAVENOUS | Status: DC | PRN
  Administered 2017-10-09 (×2): 250 mL via INTRAVENOUS

## 2017-10-08 MED ORDER — ONDANSETRON HCL 4 MG PO TABS: 4.0000 mg | ORAL_TABLET | Freq: Four times a day (QID) | ORAL | Status: DC | PRN

## 2017-10-08 MED ORDER — VITAMIN B-1 100 MG PO TABS
100.0000 mg | ORAL_TABLET | Freq: Every day | ORAL | Status: DC
  Administered 2017-10-09 – 2017-10-18 (×10): 100 mg via ORAL
  Filled 2017-10-08 (×10): qty 1

## 2017-10-08 MED ORDER — SODIUM CHLORIDE 0.9% FLUSH
3.0000 mL | Freq: Two times a day (BID) | INTRAVENOUS | Status: DC
  Administered 2017-10-09 – 2017-10-18 (×20): 3 mL via INTRAVENOUS

## 2017-10-08 MED ORDER — BOOST PLUS PO LIQD
237.0000 mL | Freq: Three times a day (TID) | ORAL | Status: DC
  Administered 2017-10-08 – 2017-10-18 (×15): 237 mL via ORAL
  Filled 2017-10-08 (×31): qty 237

## 2017-10-08 MED ORDER — ALBUMIN HUMAN 5 % IV SOLN
12.5000 g | Freq: Once | INTRAVENOUS | Status: DC
  Filled 2017-10-08: qty 250

## 2017-10-08 MED ORDER — SODIUM CHLORIDE 0.9 % IV SOLN
2.0000 g | Freq: Every day | INTRAVENOUS | Status: DC
  Administered 2017-10-09 – 2017-10-10 (×3): 2 g via INTRAVENOUS
  Filled 2017-10-08 (×3): qty 2

## 2017-10-08 MED ORDER — BOOST / RESOURCE BREEZE PO LIQD CUSTOM
1.0000 | Freq: Three times a day (TID) | ORAL | Status: DC
  Filled 2017-10-08: qty 1

## 2017-10-08 MED ORDER — OXYCODONE HCL 5 MG PO TABS
5.0000 mg | ORAL_TABLET | ORAL | Status: DC | PRN
  Administered 2017-10-08 – 2017-10-18 (×24): 5 mg via ORAL
  Filled 2017-10-08 (×26): qty 1

## 2017-10-08 MED ORDER — LACTULOSE 10 GM/15ML PO SOLN
20.0000 g | Freq: Every day | ORAL | Status: DC
  Administered 2017-10-09 – 2017-10-14 (×5): 20 g via ORAL
  Filled 2017-10-08 (×9): qty 30

## 2017-10-08 MED ORDER — ONDANSETRON HCL 4 MG/2ML IJ SOLN
4.0000 mg | Freq: Four times a day (QID) | INTRAMUSCULAR | Status: DC | PRN
  Administered 2017-10-09 – 2017-10-10 (×2): 4 mg via INTRAVENOUS
  Filled 2017-10-08 (×2): qty 2

## 2017-10-08 MED ORDER — OXYCODONE HCL 5 MG PO TABS
5.0000 mg | ORAL_TABLET | Freq: Once | ORAL | Status: AC
  Administered 2017-10-08: 5 mg via ORAL
  Filled 2017-10-08: qty 1

## 2017-10-08 MED ORDER — SODIUM CHLORIDE 0.9% FLUSH: 3.0000 mL | INTRAVENOUS | Status: DC | PRN

## 2017-10-08 NOTE — ED Notes (Signed)
ED TO INPATIENT HANDOFF REPORT  Name/Age/Gender Jerry Mcintyre 62 y.o. male  Code Status Code Status History    Date Active Date Inactive Code Status Order ID Comments User Context   08/11/2017 1103 08/16/2017 2153 DNR [id number]  Basilio Cairo, NP Inpatient   08/09/2017 2234 08/11/2017 1103 Full Code [id number]  Rise Patience, MD Inpatient   03/31/2017 2114 04/03/2017 1910 Full Code [id number]  Bethena Roys, MD ED   01/05/2017 0049 01/07/2017 1947 Full Code [id number]  Ivor Costa, MD ED    Questions for Most Recent Historical Code Status (Order [id number])    Question Answer Comment   In the event of cardiac or respiratory ARREST Do not call a "code blue"    In the event of cardiac or respiratory ARREST Do not perform Intubation, CPR, defibrillation or ACLS    In the event of cardiac or respiratory ARREST Use medication by any route, position, wound care, and other measures to relive pain and suffering. May use oxygen, suction and manual treatment of airway obstruction as needed for comfort.       Home/SNF/Other Home  Chief Complaint Shortness of Breath/ Swollen Feet/ Paracentesis  Level of Care/Admitting Diagnosis ED Disposition    ED Disposition Condition Comment   Admit  Hospital Area: [place] [[id number]]  Level of Care: Telemetry [5]  Admit to tele based on following criteria: Other see comments  Comments: hyperkalemia  Diagnosis: Ascites [449675]  Admitting Physician: Toy Baker [[id number]]  Attending Physician: Toy Baker [[id number]]  Estimated length of stay: 3 - 4 days  Certification:: I certify this patient will need inpatient services for at least 2 midnights  PT Class (Do Not Modify): Inpatient [101]  PT Acc Code (Do Not Modify): Private [1]       Medical History Past Medical History:  Diagnosis Date  . Hernia, abdominal   . Hypertension   . 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 Bee  Venom     IV Location/Drains/Wounds Patient Lines/Drains/Airways Status   Active Line/Drains/Airways    Name:   Placement date:   Placement time:   Site:   Days:   Peripheral IV 10/08/17 Right;Upper Arm   10/08/17    1842    Arm   less than 1          Labs/Imaging Results for orders placed or performed during the hospital encounter of 10/08/17 (from the past 48 hour(s))  Brain natriuretic peptide     Status: None   Collection Time: 10/08/17  6:20 PM  Result Value Ref Range   B Natriuretic Peptide 92.9 0.0 - 100.0 pg/mL    Comment: Performed at [facility] [place] [address] [place], [place] [address]  Comprehensive metabolic panel     Status: Abnormal   Collection Time: 10/08/17  6:35 PM  Result Value Ref Range   Sodium 126 (L) 135 - 145 mmol/L   Potassium 5.8 (H) 3.5 - 5.1 mmol/L    Comment: NO VISIBLE HEMOLYSIS   Chloride 96 (L) 98 - 111 mmol/L   CO2 20 (L) 22 - 32 mmol/L   Glucose, Bld 116 (H) 70 - 99 mg/dL   BUN 40 (H) 8 - 23 mg/dL   Creatinine, Ser QUANTITY NOT SUFFICIENT, UNABLE TO PERFORM TEST 0.61 - 1.24 mg/dL   Calcium 9.0 8.9 - 10.3 mg/dL   Total Protein 7.6 6.5 - 8.1 g/dL   Albumin 3.2 (L)  3.5 - 5.0 g/dL   AST 73 (H) 15 - 41 U/L   ALT 25 0 - 44 U/L   Alkaline Phosphatase 65 38 - 126 U/L   Total Bilirubin 3.4 (H) 0.3 - 1.2 mg/dL   GFR calc non Af Amer NOT CALCULATED >60 mL/min   GFR calc Af Amer NOT CALCULATED >60 mL/min   Anion gap 10 5 - 15    Comment: Performed at [facility], [place] [address], [place], [place] [address]  CBC with Differential     Status: Abnormal   Collection Time: 10/08/17  6:35 PM  Result Value Ref Range   WBC 4.0 4.0 - 10.5 K/uL   RBC 2.79 (L) 4.22 - 5.81 MIL/uL   Hemoglobin 9.2 (L) 13.0 - 17.0 g/dL   HCT 26.3 (L) 39.0 - 52.0 %   MCV 94.3 78.0 - 100.0 fL   MCH 33.0 26.0 - 34.0 pg   MCHC 35.0 30.0 - 36.0 g/dL   RDW 15.5 11.5 - 15.5 %   Platelets 93 (L) 150 - 400 K/uL    Comment: SPECIMEN CHECKED  FOR CLOTS REPEATED TO VERIFY PLATELET COUNT CONFIRMED BY SMEAR    Neutrophils Relative % 62 %   Lymphocytes Relative 16 %   Monocytes Relative 17 %   Eosinophils Relative 3 %   Basophils Relative 2 %   Neutro Abs 2.5 1.7 - 7.7 K/uL   Lymphs Abs 0.6 (L) 0.7 - 4.0 K/uL   Monocytes Absolute 0.7 0.1 - 1.0 K/uL   Eosinophils Absolute 0.1 0.0 - 0.7 K/uL   Basophils Absolute 0.1 0.0 - 0.1 K/uL   RBC Morphology TARGET CELLS     Comment: BURR CELLS Performed at [facility], [place] [address], [place], [place] [address]   Magnesium     Status: None   Collection Time: 10/08/17  6:35 PM  Result Value Ref Range   Magnesium 2.4 1.7 - 2.4 mg/dL    Comment: Performed at [facility], [place] [address] [place], [place] [address]  I-Stat CG4 Lactic Acid, ED     Status: None   Collection Time: 10/08/17  6:49 PM  Result Value Ref Range   Lactic Acid, Venous 1.72 0.5 - 1.9 mmol/L  Protime-INR     Status: Abnormal   Collection Time: 10/08/17  7:38 PM  Result Value Ref Range   Prothrombin Time 15.9 (H) 11.4 - 15.2 seconds   INR 1.29     Comment: Performed at [facility], [place] [address] [place], [place] [address]  Creatinine, serum     Status: Abnormal   Collection Time: 10/08/17  9:07 PM  Result Value Ref Range   Creatinine, Ser 2.76 (H) 0.61 - 1.24 mg/dL   GFR calc non Af Amer 23 (L) >60 mL/min   GFR calc Af Amer 27 (L) >60 mL/min    Comment: (NOTE) The eGFR has been calculated using the CKD EPI equation. This calculation has not been validated in all clinical situations. eGFR's persistently <60 mL/min signify possible Chronic Kidney Disease. Performed at [facility], [place] [address], [place], [place] [address]    Dg Chest 2 View  Result Date: 10/08/2017 CLINICAL DATA:  Dyspnea, ascites EXAM: CHEST - 2 VIEW COMPARISON:  08/09/2017 chest radiograph. FINDINGS: Very low lung volumes. Stable cardiomediastinal silhouette  with normal heart size. No pneumothorax. No pleural effusion. Moderate right basilar scarring versus atelectasis. No pulmonary edema. No acute consolidative airspace disease. IMPRESSION:  Very low lung volumes. Moderate right basilar scarring versus atelectasis. Electronically Signed   By: Ilona Sorrel M.D.   On: 10/08/2017 20:00    Pending Labs Unresulted Labs (From admission, onward)    Start     Ordered   10/08/17 4712  Basic metabolic panel  Add-on,   R     10/08/17 2126   Signed and Held  Magnesium  Tomorrow morning,   R    Comments:  Call MD if <1.5    Signed and Held   Signed and Held  Phosphorus  Tomorrow morning,   R     Signed and Held   Signed and Held  TSH  Once,   R    Comments:  Cancel if already done within 1 month and notify MD    Signed and Held   Signed and Held  Comprehensive metabolic panel  Once,   R    Comments:  Cal MD for K<3.5 or >5.0    Signed and Held   Signed and Held  CBC  Once,   R    Comments:  Call for hg <8.0    Signed and Held   Signed and Held  Creatinine, urine, random  Once,   R     Signed and Held   Signed and Held  Sodium, urine, random  Once,   R     Signed and Held   Signed and Held  Prealbumin  Tomorrow morning,   R     Signed and Held   Signed and Held  Protime-INR  Tomorrow morning,   R     Signed and Held          Vitals/Pain Today's Vitals   10/08/17 1857 10/08/17 1900 10/08/17 2107 10/08/17 2115  BP: 116/81 98/62 119/84   Pulse: 82 89 86   Resp: 17 (!) 28 18   Temp:   98.1 F (36.7 C)   TempSrc:   Oral   SpO2: 98% 97% 95%   Weight:   70.3 kg   Height:   _0  (1.753 m)   PainSc:    4     Isolation Precautions No active isolations  Medications Medications  feeding supplement (BOOST / RESOURCE BREEZE) liquid 1 Container (has no administration in time range)  oxyCODONE (Oxy IR/ROXICODONE) immediate release tablet 5 mg (5 mg Oral Given 10/08/17 1911)    Mobility walks

## 2017-10-08 NOTE — ED Triage Notes (Signed)
Patient here from home with complaints of abdominal swelling. Hx of liver cancer and hep C. Patient abdomen grossly distended. States "I think I waited too long this time".

## 2017-10-08 NOTE — ED Notes (Signed)
Lavender, blue, dark green, and two gold tops sent to lab to hold.

## 2017-10-08 NOTE — ED Provider Notes (Signed)
[facility] DEPT Provider Note   CSN: [id number] Arrival date & time: 10/08/17  1647     History   Chief Complaint Chief Complaint  Patient presents with  . Shortness of Breath  . Ascites    HPI Jerry Mcintyre is a 62 y.o. male.  62 year old male with extensive past medical history including hepatitis C, hepatocellular carcinoma, malnutrition, retention who presents with shortness of breath and abdominal distention.  Patient states that he has had progressively worsening abdominal distention due to his ascites as well as progressively worsening shortness of breath.  He denies any associated cough.  He reports diffuse body pain from his shoulders downward.  No fevers, vomiting, or urinary symptoms.  His last therapeutic paracentesis was 1 month ago.  He states that he has been taking his medications.  The history is provided by the patient.  Shortness of Breath     Past Medical History:  Diagnosis Date  . Hernia, abdominal   . Hypertension   . 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 Hyperkalemia 10/08/2017  . Palliative care by specialist   . Goals of care, counseling/discussion   . Protein-calorie malnutrition, severe 08/10/2017  . Decompensated hepatic cirrhosis ([place]) 08/09/2017  . Bilateral lower extremity edema 08/09/2017  . ARF (acute renal failure) ([place]) 08/09/2017  . Fluid overload 03/31/2017  . Chronic hepatitis C without hepatic coma ([place])   . Thrombocytopenia ([place]) 01/05/2017  . Hemoptysis 01/05/2017  . Abdominal pain 01/05/2017  . Abdominal distention 01/05/2017  . Hypokalemia 01/05/2017  . Abnormal liver function 01/05/2017  . Ascites 01/05/2017  . Anasarca 01/05/2017  . Abdominal distension   . Cirrhosis of liver with ascites [facility])     Past Surgical History:  Procedure Laterality Date  . DENTAL SURGERY    . teeth removal          Home Medications    Prior to Admission  medications   Medication Sig Start Date End Date Taking? Authorizing Provider  furosemide (LASIX) 20 MG tablet Take 1 tablet (20 mg total) by mouth daily. 08/17/17  Yes Irene Pap N, DO  lactulose (CHRONULAC) 10 GM/15ML solution Take 30 mLs (20 g total) by mouth daily. 08/17/17  Yes Kayleen Memos, DO  spironolactone (ALDACTONE) 25 MG tablet Take 1 tablet (25 mg total) by mouth daily. 08/17/17  Yes Kayleen Memos, DO  lactose free nutrition (BOOST) LIQD Take 237 mLs by mouth 3 (three) times daily between meals. Patient not taking: Reported on 10/08/2017 08/16/17   Kayleen Memos, DO  pantoprazole (PROTONIX) 40 MG tablet Take 1 tablet (40 mg total) by mouth daily. Patient not taking: Reported on 10/08/2017 08/16/17   Kayleen Memos, DO    Family History Family History  Problem Relation Age of Onset  . Stroke Mother   . Hypertension Mother     Social History Social History   Tobacco Use  . Smoking status: Former Smoker    Last attempt to quit: 11/01/2016    Years since quitting: 0.9  . Smokeless tobacco: Never Used  Substance Use Topics  . Alcohol use: Yes    Comment: "drink a beer in the summertime"  . Drug use: No     Allergies   Bee venom   Review of Systems Review of Systems  Respiratory: Positive for shortness of breath.    All other systems reviewed and are negative except that which  was mentioned in HPI   Physical Exam Updated Vital Signs BP 119/84 (BP Location: Left Arm)   Pulse 86   Temp 98 F (36.7 C) (Oral)   Resp 18   SpO2 95%   Physical Exam  Constitutional: He is oriented to person, place, and time. He appears well-developed. He appears ill. No distress.  Cachectic and chronically ill-appearing, uncomfortable  HENT:  Head: Normocephalic and atraumatic.  Moist mucous membranes  Eyes: Conjunctivae are normal.  Neck: Neck supple.  Cardiovascular: Normal rate and regular rhythm.  Murmur heard. Pulmonary/Chest: Breath sounds normal. Tachypnea noted. No  respiratory distress.  Mildly increased WOB, diminished breath sounds b/l  Abdominal: Bowel sounds are normal. He exhibits distension. There is no tenderness.  Severe abd distension with tense ascites; no focal tenderness  Musculoskeletal:       Right lower leg: He exhibits edema.       Left lower leg: He exhibits edema.  3+ pitting edema BLE  Neurological: He is alert and oriented to person, place, and time.  Fluent speech  Skin: Skin is warm and dry. There is pallor.  Scattered ecchymoses and spider angiomata  Psychiatric: Judgment normal. His mood appears anxious.  In pain  Nursing note and vitals reviewed.    ED Treatments / Results  Labs (all labs ordered are listed, but only abnormal results are displayed) Labs Reviewed  COMPREHENSIVE METABOLIC PANEL - Abnormal; Notable for the following components:      Result Value   Sodium 126 (*)    Potassium 5.8 (*)    Chloride 96 (*)    CO2 20 (*)    Glucose, Bld 116 (*)    BUN 40 (*)    Albumin 3.2 (*)    AST 73 (*)    Total Bilirubin 3.4 (*)    All other components within normal limits  CBC WITH DIFFERENTIAL/PLATELET - Abnormal; Notable for the following components:   RBC 2.79 (*)    Hemoglobin 9.2 (*)    HCT 26.3 (*)    Platelets 93 (*)    Lymphs Abs 0.6 (*)    All other components within normal limits  PROTIME-INR - Abnormal; Notable for the following components:   Prothrombin Time 15.9 (*)    All other components within normal limits  BRAIN NATRIURETIC PEPTIDE  MAGNESIUM  CREATININE, SERUM  I-STAT CG4 LACTIC ACID, ED    EKG EKG Interpretation  Date/Time:  Saturday October 08 2017 18:59:38 EDT Ventricular Rate:  80 PR Interval:    QRS Duration: 93 QT Interval:  390 QTC Calculation: 450 R Axis:   33 Text Interpretation:  Unknown rhythm, irregular rate Baseline wander in lead(s) V3 similar to previous Confirmed by Theotis Burrow [phone]) on 10/08/2017 7:04:20 PM   Radiology Dg Chest 2 View  Result Date:  10/08/2017 CLINICAL DATA:  Dyspnea, ascites EXAM: CHEST - 2 VIEW COMPARISON:  08/09/2017 chest radiograph. FINDINGS: Very low lung volumes. Stable cardiomediastinal silhouette with normal heart size. No pneumothorax. No pleural effusion. Moderate right basilar scarring versus atelectasis. No pulmonary edema. No acute consolidative airspace disease. IMPRESSION: Very low lung volumes. Moderate right basilar scarring versus atelectasis. Electronically Signed   By: Ilona Sorrel M.D.   On: 10/08/2017 20:00    Procedures Procedures (including critical care time)  Medications Ordered in ED Medications  oxyCODONE (Oxy IR/ROXICODONE) immediate release tablet 5 mg (5 mg Oral Given 10/08/17 1911)     Initial Impression / Assessment and Plan / ED Course  I have  reviewed the triage vital signs and the nursing notes.  Pertinent labs & imaging results that were available during my care of the patient were reviewed by me and considered in my medical decision making (see chart for details).     PT was ill appearing and tachypneic without respiratory distress. Severe ascites which is likely limiting his respiratory function. Afebrile, denies vomiting. I considered SBP but feel this is less likely.  WBC normal.  The rest of his lab work is notable for sodium 126, potassium 5.8, normal lactate, hemoglobin 9.2, platelets 93,000, NR 1.29.  Chest x-ray without acute process.  I suspect that the patient will require several large volume therapeutic paracenteses therefore recommended admission especially given his respiratory compromise.  Discussed admission with [facility] hospitalist, Dr. Roel Cluck, and patient admitted for further care.  Final Clinical Impressions(s) / ED Diagnoses   Final diagnoses:  Other ascites  Shortness of breath  Hyponatremia  Hyperkalemia    ED Discharge Orders    None       Irvine Glorioso, Wenda Overland, MD 10/08/17 2112

## 2017-10-08 NOTE — ED Notes (Signed)
RN called lab and informed them of patient being difficult stick thus the reason for using pediatric blood tubes.

## 2017-10-08 NOTE — H&P (Signed)
Jerry Mcintyre GGE:[id number] DOB: 21-Nov-1955 DOA: 10/08/2017     PCP: Otis Brace, MD   Outpatient Specialists:     GI Dr.  Sadie Haber) [place]    Patient arrived to ER on 10/08/17 at 1647  Patient coming from: home Lives alone,    Chief Complaint:  Chief Complaint  Patient presents with  . Shortness of Breath  . Ascites    HPI: Jerry Mcintyre is a 62 y.o. male with medical history significant of  chronic hepatitis C, decompensated hepatic cirrhosis, hepatocellular carcinoma, CKD 3, cachexia thrombocytopenia    Presented with   with increasing abdominal distention and shortness of breath. States that he has diffuse pain all over and difficulty breathing secondary to severe abdominal distention.  Patient was supposed to follow-up with GI but only went once.  States apparently waited too long.  Patient is no longer on transplant list. Compliance with his home medications except for spironolactone he was not able to fill that  Reports he has been eating some salty foods. Denies fever.   In July was admitted for the same Found to be in anasarca with severe ascites. treated with lactulose, diuretics, IV albumin, and salt restriction less than 2 g daily. 3 paracentesis sessions( on 08/10/2017 with 8 L of fluid removed, on 08/12/17 with 8 L removed, also on 08/13/17 11.5L fluid removed) total of 27 L removed done with improvement of abdominal distention. Given presumed esophageal varices (refused EGD), completed 5 days of IV ceftriaxone and spironolactone lactulose Lasix had to be held because of hypotension. Last meld score was 25. Patient has been seen by palliative care during last admission and was made DNR/DNI  Regarding pertinent Chronic problems: Hepatocellular carcinoma. Status post blended embolization in May 2019 at [facility]   While in ER: Noted to have severe ascites Hypotensive down to 82/64 increased respirations.  Nonfebrile.  INR elevated at 1.29 but down from  prior. Sodium down to 126 at discharge was 132 potassium noted to be elevated up to 5.8   The following Work up has been ordered so far:  Orders Placed This Encounter  Procedures  . DG Chest 2 View  . Comprehensive metabolic panel  . Brain natriuretic peptide  . CBC with Differential  . Magnesium  . Protime-INR  . Creatinine, serum  . Cardiac monitoring  . Consult to hospitalist  . Pulse oximetry, continuous  . ED EKG  . EKG 12-Lead  . Saline lock IV     Following Medications were ordered in ER: Medications  oxyCODONE (Oxy IR/ROXICODONE) immediate release tablet 5 mg (5 mg Oral Given 10/08/17 1911)    Significant initial  Findings: Abnormal Labs Reviewed  COMPREHENSIVE METABOLIC PANEL - Abnormal; Notable for the following components:      Result Value   Sodium 126 (*)    Potassium 5.8 (*)    Chloride 96 (*)    CO2 20 (*)    Glucose, Bld 116 (*)    BUN 40 (*)    Albumin 3.2 (*)    AST 73 (*)    Total Bilirubin 3.4 (*)    All other components within normal limits  CBC WITH DIFFERENTIAL/PLATELET - Abnormal; Notable for the following components:   RBC 2.79 (*)    Hemoglobin 9.2 (*)    HCT 26.3 (*)    Platelets 93 (*)    Lymphs Abs 0.6 (*)    All other components within normal limits  PROTIME-INR - Abnormal; Notable for the  following components:   Prothrombin Time 15.9 (*)    All other components within normal limits     Cr    Up from baseline see below Lab Results  Component Value Date   CREATININE QUANTITY NOT SUFFICIENT, UNABLE TO PERFORM TEST 10/08/2017   CREATININE 1.49 (H) 08/14/2017   CREATININE 1.54 (H) 08/13/2017       HG/HCT  stable,       Component Value Date/Time   HGB 9.2 (L) 10/08/2017 1835   HCT 26.3 (L) 10/08/2017 1835       BNP (last 3 results) Recent Labs    01/05/17 0035 10/08/17 1820  BNP 105.2* 92.9    ProBNP (last 3 results) No results for input(s): PROBNP in the last 8760 hours.  Lactic Acid, Venous    Component Value  Date/Time   LATICACIDVEN 1.72 10/08/2017 1849      UA  not ordered    CXR -  nON acute   ECG:  Personally reviewed by me showing: HR : 80 Rhythm:  NSR,   no evidence of ischemic changes QTC 450     ED Triage Vitals  Enc Vitals Group     BP 10/08/17 1736 102/73     Pulse Rate 10/08/17 1736 90     Resp 10/08/17 1736 18     Temp 10/08/17 1736 (!) 97.5 F (36.4 C)     Temp Source 10/08/17 1736 Oral     SpO2 10/08/17 1736 96 %     Weight --      Height --      Head Circumference --      Peak Flow --      Pain Score 10/08/17 1743 10     Pain Loc --      Pain Edu? --      Excl. in [place]? --   TMAX(24)@       Latest  Blood pressure 98/62, pulse 89, temperature 98 F (36.7 C), temperature source Oral, resp. rate (!) 28, SpO2 97 %.      Hospitalist was called for admission for symptomatic ascites resulting in shortness of breath   Review of Systems:    Pertinent positives include: abdominal pain  Constitutional:  No weight loss, night sweats, Fevers, chills, fatigue, weight loss  HEENT:  No headaches, Difficulty swallowing,Tooth/dental problems,Sore throat,  No sneezing, itching, ear ache, nasal congestion, post nasal drip,  Cardio-vascular:  No chest pain, Orthopnea, PND, anasarca, dizziness, palpitations.no Bilateral lower extremity swelling  GI:  No heartburn, indigestion, , nausea, vomiting, diarrhea, change in bowel habits, loss of appetite, melena, blood in stool, hematemesis Resp:  no shortness of breath at rest. No dyspnea on exertion, No excess mucus, no productive cough, No non-productive cough, No coughing up of blood.No change in color of mucus.No wheezing. Skin:  no rash or lesions. No jaundice GU:  no dysuria, change in color of urine, no urgency or frequency. No straining to urinate.  No flank pain.  Musculoskeletal:  No joint pain or no joint swelling. No decreased range of motion. No back pain.  Psych:  No change in mood or affect. No depression  or anxiety. No memory loss.  Neuro: no localizing neurological complaints, no tingling, no weakness, no double vision, no gait abnormality, no slurred speech, no confusion  All systems reviewed and apart from [place] all are negative  Past Medical History:   Past Medical History:  Diagnosis Date  . Hernia, abdominal   . Hypertension   .  Slipped disc in neck       Past Surgical History:  Procedure Laterality Date  . DENTAL SURGERY    . teeth removal      Social History:  Ambulatory  independently       reports that he quit smoking about 11 months ago. He has never used smokeless tobacco. He reports that he drinks alcohol. He reports that he does not use drugs.     Family History:    Family History  Problem Relation Age of Onset  . Stroke Mother   . Hypertension Mother     Allergies: Allergies  Allergen Reactions  . Bee Venom      Prior to Admission medications   Medication Sig Start Date End Date Taking? Authorizing Provider  furosemide (LASIX) 20 MG tablet Take 1 tablet (20 mg total) by mouth daily. 08/17/17  Yes Irene Pap N, DO  lactulose (CHRONULAC) 10 GM/15ML solution Take 30 mLs (20 g total) by mouth daily. 08/17/17  Yes Kayleen Memos, DO  spironolactone (ALDACTONE) 25 MG tablet Take 1 tablet (25 mg total) by mouth daily. 08/17/17  Yes Kayleen Memos, DO  lactose free nutrition (BOOST) LIQD Take 237 mLs by mouth 3 (three) times daily between meals. Patient not taking: Reported on 10/08/2017 08/16/17   Kayleen Memos, DO  pantoprazole (PROTONIX) 40 MG tablet Take 1 tablet (40 mg total) by mouth daily. Patient not taking: Reported on 10/08/2017 08/16/17   Kayleen Memos, DO   Physical Exam: Blood pressure 98/62, pulse 89, temperature 98 F (36.7 C), temperature source Oral, resp. rate (!) 28, SpO2 97 %. 1. General:  in No Acute distress   Chronically ill -appearing 2. Psychological: Alert and  Oriented 3. Head/ENT:   Moist Mucous Membranes                           Head Non traumatic, neck supple                            Poor Dentition 4. SKIN:  decreased Skin turgor,  Skin clean Dry and intact no rash 5. Heart: Regular rate and rhythm no  Murmur, no Rub or gallop 6. Lungs:  no wheezes or crackles   7. Abdomen: Soft,  non-tender,  severely distended bowel sounds distant 8. Lower extremities: no clubbing, cyanosis, 2+ edema 9. Neurologically Grossly intact, moving all 4 extremities equally no asterixis  10. MSK: Normal range of motion   LABS:     Recent Labs  Lab 10/08/17 1835  WBC 4.0  NEUTROABS 2.5  HGB 9.2*  HCT 26.3*  MCV 94.3  PLT 93*   Basic Metabolic Panel: Recent Labs  Lab 10/08/17 1835  NA 126*  K 5.8*  CL 96*  CO2 20*  GLUCOSE 116*  BUN 40*  CREATININE QUANTITY NOT SUFFICIENT, UNABLE TO PERFORM TEST  CALCIUM 9.0  MG 2.4      Recent Labs  Lab 10/08/17 1835  AST 73*  ALT 25  ALKPHOS 65  BILITOT 3.4*  PROT 7.6  ALBUMIN 3.2*   No results for input(s): LIPASE, AMYLASE in the last 168 hours. No results for input(s): AMMONIA in the last 168 hours.    HbA1C: No results for input(s): HGBA1C in the last 72 hours. CBG: No results for input(s): GLUCAP in the last 168 hours.    Urine analysis:    Component Value  Date/Time   COLORURINE AMBER (A) 08/10/2017 [place] 08/10/2017 0524   LABSPEC 1.020 08/10/2017 0524   PHURINE 5.0 08/10/2017 0524   GLUCOSEU NEGATIVE 08/10/2017 0524   HGBUR NEGATIVE 08/10/2017 0524   BILIRUBINUR NEGATIVE 08/10/2017 0524   KETONESUR NEGATIVE 08/10/2017 0524   PROTEINUR NEGATIVE 08/10/2017 0524   NITRITE NEGATIVE 08/10/2017 0524   LEUKOCYTESUR NEGATIVE 08/10/2017 0524      Cultures:    Component Value Date/Time   SDES PERITONEAL 08/10/2017 1031   SDES PERITONEAL 08/10/2017 1031   SPECREQUEST NONE 08/10/2017 1031   SPECREQUEST NONE 08/10/2017 1031   CULT  08/10/2017 1031    NO GROWTH 5 DAYS Performed at [facility], [place] [address]  [place], [place] [address]    REPTSTATUS 08/15/2017 FINAL 08/10/2017 1031   REPTSTATUS 08/10/2017 FINAL 08/10/2017 1031     Radiological Exams on Admission: Dg Chest 2 View  Result Date: 10/08/2017 CLINICAL DATA:  Dyspnea, ascites EXAM: CHEST - 2 VIEW COMPARISON:  08/09/2017 chest radiograph. FINDINGS: Very low lung volumes. Stable cardiomediastinal silhouette with normal heart size. No pneumothorax. No pleural effusion. Moderate right basilar scarring versus atelectasis. No pulmonary edema. No acute consolidative airspace disease. IMPRESSION: Very low lung volumes. Moderate right basilar scarring versus atelectasis. Electronically Signed   By: Ilona Sorrel M.D.   On: 10/08/2017 20:00    Chart has been reviewed    Assessment/Plan   62 y.o. male with medical history significant of  chronic hepatitis C, decompensated hepatic cirrhosis, hepatocellular carcinoma, CKD 3, cachexia thrombocytopenia     Admitted for symptomatic ascites and acute renal failure  Present on Admission:  AKI -new renal function.  Will hold spironolactone and Lasix worrisome for hepatorenal syndrome.  Will administer albumin patient is currently appears to be severely fluid overloaded.  Obtain urine electrolytes if kidney function does not improve may need nephrology consult.  Overall very poor prognosis percent if there is a possibility of hepatorenal syndrome   . Ascites order ultrasound-guided paracentesis patient last time required 3 separate procedures.  For the first will order 8 L of and give IV albumin.  Administer Rocephin 2 g every 24 hours.  . Chronic hepatitis C without hepatic coma (HCC)  - end-stage meld score 24 continue lactulose, . Protein-calorie malnutrition, severe order prealbumin and nutritional consult boost 3 times daily . Thrombocytopenia ([place]) - chronic likely secondary to liver disease . Hyperkalemia -  hold spironolactone for tonight monitor on telemetry repeat and if continues to be elevated we  will treat  Hyponatremia the setting of worsening liver function  Other plan as per orders.  DVT prophylaxis:  SCD    Code Status:    DNR/DNI  as per patient   I had personally discussed CODE STATUS with patient    Family Communication:   Family not  at  Bedside    Disposition Plan:    To home once workup is complete and patient is stable                     Would benefit from PT/OT eval prior to DC  Ordered                                       Consults called: call [facility] in a.m.  Admission status:    inpatient     Expect 2 midnight  stay secondary to severity of patient's current illness including  extensive comorbidities including end stage Liver disease I expect  patient to be hospitalized for 2 midnights requiring inpatient medical care. Patient is at high risk for adverse outcome (such as loss of life or disability) if not treated. Indication for inpatient stay as follows:    Need for IV antibiotics, careful frequent management of hemodynamic status and frequent procedures    Level of care    tele            Toy Baker 10/08/2017, 9:37 PM    [facility]  Pager [phone]   after 2 AM please page floor coverage PA If 7AM-7PM, please contact the day team taking care of the patient  [email or web address]  Password [email or web address]

## 2017-10-09 ENCOUNTER — Inpatient Hospital Stay (HOSPITAL_COMMUNITY): Payer: Medicare Other

## 2017-10-09 ENCOUNTER — Other Ambulatory Visit: Payer: Self-pay

## 2017-10-09 DIAGNOSIS — N179 Acute kidney failure, unspecified: Secondary | ICD-10-CM | POA: Diagnosis present

## 2017-10-09 DIAGNOSIS — R0602 Shortness of breath: Secondary | ICD-10-CM

## 2017-10-09 LAB — COMPREHENSIVE METABOLIC PANEL
ALBUMIN: 3.5 g/dL (ref 3.5–5.0)
ALK PHOS: 61 U/L (ref 38–126)
ALT: 28 U/L (ref 0–44)
ANION GAP: 7 (ref 5–15)
AST: 75 U/L — ABNORMAL HIGH (ref 15–41)
BUN: 43 mg/dL — ABNORMAL HIGH (ref 8–23)
CALCIUM: 9 mg/dL (ref 8.9–10.3)
CO2: 24 mmol/L (ref 22–32)
CREATININE: 2.7 mg/dL — AB (ref 0.61–1.24)
Chloride: 96 mmol/L — ABNORMAL LOW (ref 98–111)
GFR calc Af Amer: 27 mL/min — ABNORMAL LOW (ref >60)
GFR calc non Af Amer: 24 mL/min — ABNORMAL LOW (ref >60)
GLUCOSE: 99 mg/dL (ref 70–99)
Potassium: 5.2 mmol/L — ABNORMAL HIGH (ref 3.5–5.1)
SODIUM: 127 mmol/L — AB (ref 135–145)
TOTAL PROTEIN: 7.8 g/dL (ref 6.5–8.1)
Total Bilirubin: 2.3 mg/dL — ABNORMAL HIGH (ref 0.3–1.2)

## 2017-10-09 LAB — GRAM STAIN

## 2017-10-09 LAB — BODY FLUID CELL COUNT WITH DIFFERENTIAL
LYMPHS FL: 91 %
MONOCYTE-MACROPHAGE-SEROUS FLUID: 8 % — AB (ref 50–90)
Neutrophil Count, Fluid: 1 % (ref 0–25)
Total Nucleated Cell Count, Fluid: 49 cu mm (ref 0–1000)

## 2017-10-09 LAB — PROTEIN, PLEURAL OR PERITONEAL FLUID: Total protein, fluid: <3 g/dL

## 2017-10-09 LAB — CBC
HCT: 28.3 % — ABNORMAL LOW (ref 39.0–52.0)
HEMOGLOBIN: 10.1 g/dL — AB (ref 13.0–17.0)
MCH: 33.6 pg (ref 26.0–34.0)
MCHC: 35.7 g/dL (ref 30.0–36.0)
MCV: 94 fL (ref 78.0–100.0)
PLATELETS: 107 10*3/uL — AB (ref 150–400)
RBC: 3.01 MIL/uL — ABNORMAL LOW (ref 4.22–5.81)
RDW: 16 % — AB (ref 11.5–15.5)
WBC: 3.1 10*3/uL — ABNORMAL LOW (ref 4.0–10.5)

## 2017-10-09 LAB — PHOSPHORUS: Phosphorus: 2.9 mg/dL (ref 2.5–4.6)

## 2017-10-09 LAB — CREATININE, URINE, RANDOM: CREATININE, URINE: 208.61 mg/dL

## 2017-10-09 LAB — PREALBUMIN: Prealbumin: <5 mg/dL — ABNORMAL LOW (ref 18–38)

## 2017-10-09 LAB — MAGNESIUM: MAGNESIUM: 2.5 mg/dL — AB (ref 1.7–2.4)

## 2017-10-09 LAB — TSH: TSH: 2.5 u[IU]/mL (ref 0.350–4.500)

## 2017-10-09 LAB — PROTIME-INR
INR: 1.34
Prothrombin Time: 16.4 seconds — ABNORMAL HIGH (ref 11.4–15.2)

## 2017-10-09 LAB — ALBUMIN, PLEURAL OR PERITONEAL FLUID: ALBUMIN FL: 1.4 g/dL

## 2017-10-09 LAB — SODIUM, URINE, RANDOM

## 2017-10-09 MED ORDER — CALCIUM CARBONATE ANTACID 500 MG PO CHEW
400.0000 mg | CHEWABLE_TABLET | Freq: Two times a day (BID) | ORAL | Status: DC | PRN
  Administered 2017-10-09 – 2017-10-14 (×4): 400 mg via ORAL
  Filled 2017-10-09 (×4): qty 2

## 2017-10-09 MED ORDER — ALBUMIN HUMAN 5 % IV SOLN
12.5000 g | Freq: Once | INTRAVENOUS | Status: AC
  Administered 2017-10-09: 12.5 g via INTRAVENOUS
  Filled 2017-10-09: qty 250

## 2017-10-09 MED ORDER — LIDOCAINE HCL 1 % IJ SOLN
INTRAMUSCULAR | Status: AC
  Filled 2017-10-09: qty 10

## 2017-10-09 NOTE — Progress Notes (Signed)
PT Cancellation Note  Patient Details Name: Jerry Mcintyre MRN: [id number] DOB: 09-May-1955   Cancelled Treatment:    Reason Eval/Treat Not Completed: Medical issues which prohibited therapy(pt reports he's nauseous, not able to do PT at present. Will follow. )   Philomena Doheny 10/09/2017, 1:39 PM [phone]

## 2017-10-09 NOTE — Progress Notes (Signed)
PT Cancellation Note  Patient Details Name: Jerry Mcintyre MRN: [id number] DOB: [birth year]   Cancelled Treatment:    Reason Eval/Treat Not Completed: Patient at procedure or test/unavailable(pt at ultrasound, will follow. )   Philomena Doheny 10/09/2017, 8:57 AM [phone]

## 2017-10-09 NOTE — Progress Notes (Signed)
PROGRESS NOTE    Jerry Mcintyre  HWE:[id number] DOB: 1955-09-17 DOA: 10/08/2017 PCP: Otis Brace, MD  Brief Narrative: 62 y.o. male with medical history significant of chronic hepatitis C, decompensated hepaticcirrhosis, hepatocellular carcinoma, CKD 3, cachexia thrombocytopenia    Presented with   with increasing abdominal distention and shortness of breath. States that he has diffuse pain all over and difficulty breathing secondary to severe abdominal distention.  Patient was supposed to follow-up with GI but only went once.  States apparently waited too long.  Patient is no longer on transplant list. Compliance with his home medications except for spironolactone he was not able to fill that  Reports he has been eating some salty foods. Denies fever.   In July was admitted for the same Found to be in anasarca with severe ascites. treated with lactulose, diuretics,IV albumin,and salt restriction less than 2 g daily. 3 paracentesis sessions( on 08/10/2017 with 8 L of fluid removed,on 08/12/17 with 8 L removed, alsoon 08/13/17 11.5L fluid removed) total of 27 L removed done with improvement of abdominal distention. Given presumed esophageal varices (refused EGD), completed 5 days of IV ceftriaxone and spironolactone lactulose Lasix had to be held because of hypotension. Last meld score was 25. Patient has been seen by palliative care during last admission and was made DNR/DNI  Regarding pertinent Chronic problems: Hepatocellular carcinoma. Status post blended embolization in May 2019 at [facility] While in ER: Noted to have severe ascites Hypotensive down to 82/64 increased respirations.  Nonfebrile.  INR elevated at 1.29 but down from prior. Sodium down to 126 at discharge was 132 potassium noted to be elevated up to 5.8 Assessment & Plan:   Active Problems:   Thrombocytopenia (HCC)   Ascites   Anasarca   Abdominal distension   Chronic hepatitis C without hepatic coma (HCC)  Protein-calorie malnutrition, severe   Hyperkalemia   Hyponatremia   AKI (acute kidney injury) ([place])  1] decompensated hepatic cirrhosis with history of hepatocellular carcinoma and hepatitis C status post therapeutic paracentesis today one lit removed.  He received albumin.  Continue lactulose.  Patient's overall prognosis is guarded I will consult palliative care.he had 27 litres of fluid removed in July 2019.continue rocephin .  2] protein calorie malnutrition prealbumin low nutrition consult pending continue boost.  3] hyponatremia admitting sodium 126 today he is 127 monitor and follow. He has chronic hyponatremia due to cirrhosis.  4] AKI CKD 3.follow.lasix and aldactone currently on hold.  5] pancytopenia secondary to #1   DVT prophylaxisscd Code Status:dnr Family Communication: none Disposition Plan:  tbd Consultants:  palliative Proceduresparacentesis 9/8 Antimicrobials: rocephin  Subjective: Seen after paracentesis awake alert asking for food feels better after removal of 8 lit says belly still BIG  Objective: Vitals:   10/09/17 0837 10/09/17 0843 10/09/17 1042 10/09/17 1412  BP: 104/73 101/73 99/67 106/72  Pulse:   89 83  Resp:   20 20  Temp:   98 F (36.7 C) 98 F (36.7 C)  TempSrc:   Oral Oral  SpO2:   97% 98%  Weight:      Height:        Intake/Output Summary (Last 24 hours) at 10/09/2017 1442 Last data filed at 10/09/2017 0450 Gross per 24 hour  Intake 460.23 ml  Output 200 ml  Net 260.23 ml   Filed Weights   10/08/17 2107 10/08/17 2228 10/09/17 0437  Weight: 70.3 kg 92.3 kg 93 kg    Examination:  General exam: Appears calm and comfortable  Respiratory system: Clear to auscultation. Respiratory effort normal. Cardiovascular system: S1 & S2 heard, RRR. No JVD, murmurs, rubs, gallops or clicks. No pedal edema. Gastrointestinal system: Abdomen is DISTENDED, soft and nontender. No organomegaly or masses felt. Normal bowel sounds heard. Central  nervous system: Alert and oriented. No focal neurological deficits. Extremities: Symmetric 5 x 5 power. Skin: No rashes, lesions or ulcers Psychiatry: Judgement and insight appear normal. Mood & affect appropriate.     Data Reviewed: I have personally reviewed following labs and imaging studies  CBC: Recent Labs  Lab 10/08/17 1835 10/09/17 0414  WBC 4.0 3.1*  NEUTROABS 2.5  --   HGB 9.2* 10.1*  HCT 26.3* 28.3*  MCV 94.3 94.0  PLT 93* 235*   Basic Metabolic Panel: Recent Labs  Lab 10/08/17 1835 10/08/17 2107 10/09/17 0414  NA 126* 126* 127*  K 5.8* 5.2* 5.2*  CL 96* 96* 96*  CO2 20* 22 24  GLUCOSE 116* 118* 99  BUN 40* 41* 43*  CREATININE QUANTITY NOT SUFFICIENT, UNABLE TO PERFORM TEST 2.80*  2.76* 2.70*  CALCIUM 9.0 9.0 9.0  MG 2.4  --  2.5*  PHOS  --   --  2.9   GFR: Estimated Creatinine Clearance: 31.5 mL/min (A) (by C-G formula based on SCr of 2.7 mg/dL (H)). Liver Function Tests: Recent Labs  Lab 10/08/17 1835 10/09/17 0414  AST 73* 75*  ALT 25 28  ALKPHOS 65 61  BILITOT 3.4* 2.3*  PROT 7.6 7.8  ALBUMIN 3.2* 3.5   No results for input(s): LIPASE, AMYLASE in the last 168 hours. No results for input(s): AMMONIA in the last 168 hours. Coagulation Profile: Recent Labs  Lab 10/08/17 1938 10/09/17 0414  INR 1.29 1.34   Cardiac Enzymes: No results for input(s): CKTOTAL, CKMB, CKMBINDEX, TROPONINI in the last 168 hours. BNP (last 3 results) No results for input(s): PROBNP in the last 8760 hours. HbA1C: No results for input(s): HGBA1C in the last 72 hours. CBG: No results for input(s): GLUCAP in the last 168 hours. Lipid Profile: No results for input(s): CHOL, HDL, LDLCALC, TRIG, CHOLHDL, LDLDIRECT in the last 72 hours. Thyroid Function Tests: Recent Labs    10/09/17 0414  TSH 2.500   Anemia Panel: No results for input(s): VITAMINB12, FOLATE, FERRITIN, TIBC, IRON, RETICCTPCT in the last 72 hours. Sepsis Labs: Recent Labs  Lab 10/08/17 1849   LATICACIDVEN 1.72    No results found for this or any previous visit (from the past 240 hour(s)).       Radiology Studies: Dg Chest 2 View  Result Date: 10/08/2017 CLINICAL DATA:  Dyspnea, ascites EXAM: CHEST - 2 VIEW COMPARISON:  08/09/2017 chest radiograph. FINDINGS: Very low lung volumes. Stable cardiomediastinal silhouette with normal heart size. No pneumothorax. No pleural effusion. Moderate right basilar scarring versus atelectasis. No pulmonary edema. No acute consolidative airspace disease. IMPRESSION: Very low lung volumes. Moderate right basilar scarring versus atelectasis. Electronically Signed   By: Ilona Sorrel M.D.   On: 10/08/2017 20:00   US Paracentesis  Result Date: 10/09/2017 INDICATION: Cirrhosis, hepatitis-C, hepatocellular carcinoma, acute renal insufficiency, recurrent ascites. Request for diagnostic and therapeutic paracentesis up to 8 liters. EXAM: ULTRASOUND GUIDED PARACENTESIS MEDICATIONS: 1% lidocaine 10 mL COMPLICATIONS: None immediate. PROCEDURE: Informed written consent was obtained from the patient after a discussion of the risks, benefits and alternatives to treatment. A timeout was performed prior to the initiation of the procedure. Initial ultrasound scanning demonstrates a large amount of ascites within the right lower abdominal quadrant.  The right lower abdomen was prepped and draped in the usual sterile fashion. 1% lidocaine with epinephrine was used for local anesthesia. Following this, a 19 gauge, 7-cm, Yueh catheter was introduced. An ultrasound image was saved for documentation purposes. The paracentesis was performed. The catheter was removed and a dressing was applied. The patient tolerated the procedure well without immediate post procedural complication. FINDINGS: A total of approximately 8 L of clear yellow fluid was removed. Samples were sent to the laboratory as requested by the clinical team. IMPRESSION: Successful ultrasound-guided paracentesis  yielding 8 liters of peritoneal fluid. Read by: Gareth Eagle, PA-C Electronically Signed   By: Corrie Mckusick D.O.   On: 10/09/2017 08:59        Scheduled Meds: . lactose free nutrition  237 mL Oral TID WC  . lactulose  20 g Oral Daily  . lidocaine      . sodium chloride flush  3 mL Intravenous Q12H  . thiamine  100 mg Oral Daily   Continuous Infusions: . sodium chloride Stopped (10/09/17 0432)  . cefTRIAXone (ROCEPHIN)  IV Stopped (10/09/17 0105)     LOS: 1 day     Georgette Shell, MD [facility]  If 7PM-7AM, please contact night-coverage [email or web address] Password [email or web address] 10/09/2017, 2:42 PM

## 2017-10-09 NOTE — Procedures (Signed)
PROCEDURE SUMMARY:  Successful US guided paracentesis from right lateral abdomen.  Yielded 8 liters of clear yellow fluid.  No immediate complications.  Patient tolerated well.   Specimen was sent for labs.  Tayton Decaire S Mostafa Yuan PA-C 10/09/2017 8:59 AM

## 2017-10-09 NOTE — Progress Notes (Signed)
OT Cancellation Note  Patient Details Name: Jerry Mcintyre MRN: [id number] DOB: 22-Oct-1955   Cancelled Treatment:     PATIENT REFUSED TO SIT EOB OR TO GET OOB. PATIENT STATED HE DID NOT FEEL LIKE IT. OT TO ATTEMPT Monday.   Pia Jedlicka 10/09/2017, 10:00 AM

## 2017-10-10 LAB — COMPREHENSIVE METABOLIC PANEL
ALT: 23 U/L (ref 0–44)
AST: 64 U/L — AB (ref 15–41)
Albumin: 3 g/dL — ABNORMAL LOW (ref 3.5–5.0)
Alkaline Phosphatase: 56 U/L (ref 38–126)
Anion gap: 6 (ref 5–15)
BUN: 40 mg/dL — AB (ref 8–23)
CO2: 25 mmol/L (ref 22–32)
Calcium: 9 mg/dL (ref 8.9–10.3)
Chloride: 96 mmol/L — ABNORMAL LOW (ref 98–111)
Creatinine, Ser: 2.62 mg/dL — ABNORMAL HIGH (ref 0.61–1.24)
GFR calc Af Amer: 28 mL/min — ABNORMAL LOW (ref >60)
GFR, EST NON AFRICAN AMERICAN: 24 mL/min — AB (ref >60)
Glucose, Bld: 104 mg/dL — ABNORMAL HIGH (ref 70–99)
POTASSIUM: 5.6 mmol/L — AB (ref 3.5–5.1)
Sodium: 127 mmol/L — ABNORMAL LOW (ref 135–145)
Total Bilirubin: 2.5 mg/dL — ABNORMAL HIGH (ref 0.3–1.2)
Total Protein: 7.2 g/dL (ref 6.5–8.1)

## 2017-10-10 LAB — CBC
HCT: 30.2 % — ABNORMAL LOW (ref 39.0–52.0)
Hemoglobin: 10.8 g/dL — ABNORMAL LOW (ref 13.0–17.0)
MCH: 33.6 pg (ref 26.0–34.0)
MCHC: 35.8 g/dL (ref 30.0–36.0)
MCV: 94.1 fL (ref 78.0–100.0)
Platelets: 91 10*3/uL — ABNORMAL LOW (ref 150–400)
RBC: 3.21 MIL/uL — ABNORMAL LOW (ref 4.22–5.81)
RDW: 16 % — AB (ref 11.5–15.5)
WBC: 4.3 10*3/uL (ref 4.0–10.5)

## 2017-10-10 LAB — POTASSIUM: Potassium: 5.4 mmol/L — ABNORMAL HIGH (ref 3.5–5.1)

## 2017-10-10 LAB — AMMONIA: Ammonia: 58 umol/L — ABNORMAL HIGH (ref 9–35)

## 2017-10-10 MED ORDER — FUROSEMIDE 10 MG/ML IJ SOLN
20.0000 mg | Freq: Every day | INTRAMUSCULAR | Status: DC
  Administered 2017-10-10: 20 mg via INTRAVENOUS
  Filled 2017-10-10: qty 2

## 2017-10-10 MED ORDER — PRO-STAT SUGAR FREE PO LIQD
30.0000 mL | Freq: Two times a day (BID) | ORAL | Status: DC
  Administered 2017-10-10 – 2017-10-12 (×2): 30 mL via ORAL
  Filled 2017-10-10 (×8): qty 30

## 2017-10-10 NOTE — Progress Notes (Signed)
PT Cancellation Note  Patient Details Name: Jerry Mcintyre MRN: [id number] DOB: [birth year]   Cancelled Treatment:    Reason Eval/Treat Not Completed: Patient declined, no reason specified Pt declines to participate, reports not today.     [name],[name] 10/10/2017, 11:00 AM Carmelia Bake, PT, DPT 10/10/2017 Pager: [phone]

## 2017-10-10 NOTE — Progress Notes (Signed)
OT Cancellation Note  Patient Details Name: Jerry Mcintyre MRN: [id number] DOB: [birth year]   Cancelled Treatment:    Reason Eval/Treat Not Completed: Patient declined, no reason specified  Carlyon Shadow, [place] 10/10/2017, 2:01 PM

## 2017-10-10 NOTE — Progress Notes (Signed)
PROGRESS NOTE    Jerry Mcintyre  HER:[id number] DOB: 09/11/1955 DOA: 10/08/2017 PCP: Otis Brace, MD   Brief Narrative: Jerry Mcintyre is a 62 y.o. malewith medical history significant of chronic hepatitis C, decompensated hepatic cirrhosis, hepatocellular carcinoma, CKD 3, cachexia,thrombocytopenia. He presented secondary to abdominal distension and dyspnea. He has received one paracentesis to date.   Assessment & Plan:   Active Problems:   Thrombocytopenia (HCC)   Ascites   Anasarca   Abdominal distension   Chronic hepatitis C without hepatic coma (HCC)   Protein-calorie malnutrition, severe   Hyperkalemia   Hyponatremia   AKI (acute kidney injury) ([place])   Shortness of breath   Decompensated hepatic cirrhosis Hepatocellular carcinoma Hepatitis C Patient with a history of requiring multiple paracenteses. He is s/p paracentesis on 9/8 yielding 8 L of fluid. Currently on Rocephin for SBP prophylaxis. Fluid does not appear to support SBP -Discontinue ceftriaxone -Repeat paracentesis tomorrow -Continue lactulose  Hyponatremia Secondary to fluid overload in setting of above. Improving. -Add lasix 20 mg IV in setting of soft blood pressure -Repeat BMP in AM  Hyperkalemia In setting of acute kidney injury. Up from yesterday -Repeat potassium this afternoon after giving lasix.  Acute kidney injury on CKD stage III Baseline creatinine of 1.5-1.7. Peak of 2.8 this admission. Trending down. Not at baseline. In setting of fluid overload.  Thrombocytopenia In setting of liver disease. -Trend  Anemia Chronic. Stable.  Leukopenia Transient. Resolved.   DVT prophylaxis: SCDs Code Status:   Code Status: DNR Family Communication: None at bedside Disposition Plan: Discharge pending improvement of ascites, hyponatremia, AKI   Consultants:   Palliative care medicine  Interventional radiology  Procedures:   9/8:  Paracentesis  Antimicrobials:  Ceftriaxone    Subjective: No abdominal pain. No chest pain or dyspnea.  Objective: Vitals:   10/09/17 1042 10/09/17 1412 10/09/17 2048 10/10/17 0429  BP: 99/67 106/72 103/80 116/74  Pulse: 89 83 88 89  Resp: 20 20 18 18   Temp: 98 F (36.7 C) 98 F (36.7 C) 98 F (36.7 C) 98.5 F (36.9 C)  TempSrc: Oral Oral Oral Oral  SpO2: 97% 98% 98% 98%  Weight:    84.1 kg  Height:        Intake/Output Summary (Last 24 hours) at 10/10/2017 0956 Last data filed at 10/10/2017 0700 Gross per 24 hour  Intake 238.86 ml  Output 350 ml  Net -111.14 ml   Filed Weights   10/08/17 2228 10/09/17 0437 10/10/17 0429  Weight: 92.3 kg 93 kg 84.1 kg    Examination:  General exam: Appears calm and comfortable Respiratory system: Clear to auscultation. Respiratory effort normal. Cardiovascular system: S1 & S2 heard, RRR. No murmurs, rubs, gallops or clicks. Gastrointestinal system: Abdomen is significantly distended, soft and nontender. Normal bowel sounds heard. Central nervous system: Alert and oriented. No focal neurological deficits. Extremities: 2+ bilateral LE edema. No calf tenderness Skin: No cyanosis. No rashes Psychiatry: Judgement and insight appear normal. Mood & affect appropriate.     Data Reviewed: I have personally reviewed following labs and imaging studies  CBC: Recent Labs  Lab 10/08/17 1835 10/09/17 0414 10/10/17 0440  WBC 4.0 3.1* 4.3  NEUTROABS 2.5  --   --   HGB 9.2* 10.1* 10.8*  HCT 26.3* 28.3* 30.2*  MCV 94.3 94.0 94.1  PLT 93* 107* 91*   Basic Metabolic Panel: Recent Labs  Lab 10/08/17 1835 10/08/17 2107 10/09/17 0414 10/10/17 0440  NA 126* 126* 127* 127*  K 5.8* 5.2* 5.2* 5.6*  CL 96* 96* 96* 96*  CO2 20* 22 24 25   GLUCOSE 116* 118* 99 104*  BUN 40* 41* 43* 40*  CREATININE QUANTITY NOT SUFFICIENT, UNABLE TO PERFORM TEST 2.80*  2.76* 2.70* 2.62*  CALCIUM 9.0 9.0 9.0 9.0  MG 2.4  --  2.5*  --   PHOS  --   --  2.9   --    GFR: Estimated Creatinine Clearance: 28.9 mL/min (A) (by C-G formula based on SCr of 2.62 mg/dL (H)). Liver Function Tests: Recent Labs  Lab 10/08/17 1835 10/09/17 0414 10/10/17 0440  AST 73* 75* 64*  ALT 25 28 23   ALKPHOS 65 61 56  BILITOT 3.4* 2.3* 2.5*  PROT 7.6 7.8 7.2  ALBUMIN 3.2* 3.5 3.0*   No results for input(s): LIPASE, AMYLASE in the last 168 hours. Recent Labs  Lab 10/10/17 0550  AMMONIA 58*   Coagulation Profile: Recent Labs  Lab 10/08/17 1938 10/09/17 0414  INR 1.29 1.34   Cardiac Enzymes: No results for input(s): CKTOTAL, CKMB, CKMBINDEX, TROPONINI in the last 168 hours. BNP (last 3 results) No results for input(s): PROBNP in the last 8760 hours. HbA1C: No results for input(s): HGBA1C in the last 72 hours. CBG: No results for input(s): GLUCAP in the last 168 hours. Lipid Profile: No results for input(s): CHOL, HDL, LDLCALC, TRIG, CHOLHDL, LDLDIRECT in the last 72 hours. Thyroid Function Tests: Recent Labs    10/09/17 0414  TSH 2.500   Anemia Panel: No results for input(s): VITAMINB12, FOLATE, FERRITIN, TIBC, IRON, RETICCTPCT in the last 72 hours. Sepsis Labs: Recent Labs  Lab 10/08/17 1849  LATICACIDVEN 1.72    Recent Results (from the past 240 hour(s))  Culture, body fluid-bottle     Status: None (Preliminary result)   Collection Time: 10/09/17  8:19 AM  Result Value Ref Range Status   Specimen Description PERITONEAL  Final   Special Requests NONE  Final   Culture   Final    NO GROWTH < 24 HOURS Performed at [facility], [address] [place], [place] [address]    Report Status PENDING  Incomplete  Gram stain     Status: None   Collection Time: 10/09/17  8:19 AM  Result Value Ref Range Status   Specimen Description PERITONEAL  Final   Special Requests NONE  Final   Gram Stain   Final    RARE WBC PRESENT, PREDOMINANTLY MONONUCLEAR NO ORGANISMS SEEN Performed at [facility], [address]  [place], [place] [address]    Report Status 10/09/2017 FINAL  Final         Radiology Studies: Dg Chest 2 View  Result Date: 10/08/2017 CLINICAL DATA:  Dyspnea, ascites EXAM: CHEST - 2 VIEW COMPARISON:  08/09/2017 chest radiograph. FINDINGS: Very low lung volumes. Stable cardiomediastinal silhouette with normal heart size. No pneumothorax. No pleural effusion. Moderate right basilar scarring versus atelectasis. No pulmonary edema. No acute consolidative airspace disease. IMPRESSION: Very low lung volumes. Moderate right basilar scarring versus atelectasis. Electronically Signed   By: Ilona Sorrel M.D.   On: 10/08/2017 20:00   US Paracentesis  Result Date: 10/09/2017 INDICATION: Cirrhosis, hepatitis-C, hepatocellular carcinoma, acute renal insufficiency, recurrent ascites. Request for diagnostic and therapeutic paracentesis up to 8 liters. EXAM: ULTRASOUND GUIDED PARACENTESIS MEDICATIONS: 1% lidocaine 10 mL COMPLICATIONS: None immediate. PROCEDURE: Informed written consent was obtained from the patient after a discussion of the risks, benefits and alternatives to treatment. A timeout was  performed prior to the initiation of the procedure. Initial ultrasound scanning demonstrates a large amount of ascites within the right lower abdominal quadrant. The right lower abdomen was prepped and draped in the usual sterile fashion. 1% lidocaine with epinephrine was used for local anesthesia. Following this, a 19 gauge, 7-cm, Yueh catheter was introduced. An ultrasound image was saved for documentation purposes. The paracentesis was performed. The catheter was removed and a dressing was applied. The patient tolerated the procedure well without immediate post procedural complication. FINDINGS: A total of approximately 8 L of clear yellow fluid was removed. Samples were sent to the laboratory as requested by the clinical team. IMPRESSION: Successful ultrasound-guided paracentesis yielding 8 liters of peritoneal fluid.  Read by: Gareth Eagle, PA-C Electronically Signed   By: Corrie Mckusick D.O.   On: 10/09/2017 08:59        Scheduled Meds: . furosemide  20 mg Intravenous Daily  . lactose free nutrition  237 mL Oral TID WC  . lactulose  20 g Oral Daily  . sodium chloride flush  3 mL Intravenous Q12H  . thiamine  100 mg Oral Daily   Continuous Infusions: . sodium chloride Stopped (10/09/17 2333)  . cefTRIAXone (ROCEPHIN)  IV Stopped (10/09/17 2141)     LOS: 2 days     Cordelia Poche, MD [facility] 10/10/2017, 9:56 AM Pager: [phone]  If 7PM-7AM, please contact night-coverage [email or web address] 10/10/2017, 9:56 AM

## 2017-10-10 NOTE — Progress Notes (Signed)
Initial Nutrition Assessment  DOCUMENTATION CODES:   Severe malnutrition in context of chronic illness  INTERVENTION:  - Continue Boost Plus TID, each supplement provides 360 kcal and 14 grams of protein. - Will order 30 mL Prostat BID, each supplement provides 100 kcal and 15 grams of protein. - Continue to encourage PO intakes.   NUTRITION DIAGNOSIS:   Severe Malnutrition related to chronic illness, catabolic illness, cancer and cancer related treatments as evidenced by severe fat depletion, severe muscle depletion.  GOAL:   Patient will meet greater than or equal to 90% of their needs  MONITOR:   PO intake, Supplement acceptance, Weight trends, Labs, I & O's  REASON FOR ASSESSMENT:   Consult Assessment of nutrition requirement/status, Malnutrition Eval  ASSESSMENT:    62 y.o. male with medical history significant of chronic hepatitis C, decompensated hepatic cirrhosis, hepatocellular carcinoma, CKD 3, cachexia, and thrombocytopenia. He presented to the ED with increasing abdominal distention and SOB.  Stated that he has diffuse pain all over and difficulty breathing secondary to severe abdominal distention. He was previously on the liver transplant list but no longer is. He reported that he has been eating some salty foods. In July he was admitted for anasarca and severe ascites and during that hospitalization had 27L removed. He was seen by Palliative Care during last admission and was made DNR/DNI.  BMI indicates overweight status but feel that this is falsely inflated d/t severe ascites. No intakes documented since admission. Patient has lunch tray (baked potato and side salad) at bedside but states that he is not feeling hungry/up to eating. Patient states that his appetite has been poor recently d/t abdominal pressure from distention. He does not like many herbs and spices and prefers salt and pepper to flavor his food. He states "you're going to starve me to death with this  low sodium diet." Explained the rationale for current diet order and encouraged patient to order lemon with meals to squeeze onto items he typically salts (vegetables, meat, seafood).   He reports that he lives alone which "is becoming increasingly difficult" and that two sisters live in the state and help him with many things. Unable to obtain any additional information at this time.  NFPE outlined below. Patient had paracentesis yesterday with 8L removed and reported plan for repeat paracentesis 9/10. Per review of order, it appears that patient accepted 4 of 7 bottles of Boost since admission.    Medications reviewed; 20 mg IV Lasix/day, 20 g lactulose/day, 100 mg thiamine/day.  Labs reviewed; Na: 127 mmol/L, K: 5.6 mmol/L, Cl: 96 mmol/L, BUN: 40 mg/dL, creatinine: 2.62 mg/dL, ammonia: 58 umol/L, GFR: 24 mL/min.        NUTRITION - FOCUSED PHYSICAL EXAM:    Most Recent Value  Orbital Region  Severe depletion  Upper Arm Region  Severe depletion  Thoracic and Lumbar Region  Unable to assess  Buccal Region  Moderate depletion  Temple Region  Mild depletion  Clavicle Bone Region  Severe depletion  Clavicle and Acromion Bone Region  Severe depletion  Scapular Bone Region  Unable to assess  Dorsal Hand  Moderate depletion  Patellar Region  Unable to assess  Anterior Thigh Region  Unable to assess  Posterior Calf Region  Unable to assess  Edema (RD Assessment)  Severe [BLE]  Hair  Reviewed  Eyes  Reviewed  Mouth  Reviewed  Skin  Reviewed  Nails  Reviewed       Diet Order:   Diet Order  Diet 2 gram sodium Room service appropriate? Yes; Fluid consistency: Thin  Diet effective now              EDUCATION NEEDS:   No education needs have been identified at this time  Skin:  Skin Assessment: Reviewed RN Assessment  Last BM:  9/7  Height:   Ht Readings from Last 1 Encounters:  10/08/17 5\' 9"  (1.753 m)    Weight:   Wt Readings from Last 1 Encounters:   10/10/17 84.1 kg    Ideal Body Weight:  72.73 kg  BMI:  Body mass index is 27.39 kg/m.  Estimated Nutritional Needs:   Kcal:  2400-2600  Protein:  118-130 grams  Fluid:  per MD     Jarome Matin, MS, RD, LDN, CNSC Inpatient Clinical Dietitian Pager # [phone] After hours/weekend pager # [phone]

## 2017-10-11 ENCOUNTER — Inpatient Hospital Stay (HOSPITAL_COMMUNITY): Payer: Medicare Other

## 2017-10-11 LAB — BASIC METABOLIC PANEL
ANION GAP: 8 (ref 5–15)
BUN: 44 mg/dL — ABNORMAL HIGH (ref 8–23)
CALCIUM: 8.9 mg/dL (ref 8.9–10.3)
CHLORIDE: 96 mmol/L — AB (ref 98–111)
CO2: 22 mmol/L (ref 22–32)
Creatinine, Ser: 2.64 mg/dL — ABNORMAL HIGH (ref 0.61–1.24)
GFR calc non Af Amer: 24 mL/min — ABNORMAL LOW (ref >60)
GFR, EST AFRICAN AMERICAN: 28 mL/min — AB (ref >60)
GLUCOSE: 108 mg/dL — AB (ref 70–99)
POTASSIUM: 5.7 mmol/L — AB (ref 3.5–5.1)
SODIUM: 126 mmol/L — AB (ref 135–145)

## 2017-10-11 LAB — CBC
HEMATOCRIT: 29.7 % — AB (ref 39.0–52.0)
HEMOGLOBIN: 10.6 g/dL — AB (ref 13.0–17.0)
MCH: 33.9 pg (ref 26.0–34.0)
MCHC: 35.7 g/dL (ref 30.0–36.0)
MCV: 94.9 fL (ref 78.0–100.0)
Platelets: 93 10*3/uL — ABNORMAL LOW (ref 150–400)
RBC: 3.13 MIL/uL — ABNORMAL LOW (ref 4.22–5.81)
RDW: 16 % — ABNORMAL HIGH (ref 11.5–15.5)
WBC: 4.1 10*3/uL (ref 4.0–10.5)

## 2017-10-11 LAB — POTASSIUM: POTASSIUM: 4.9 mmol/L (ref 3.5–5.1)

## 2017-10-11 MED ORDER — LIDOCAINE HCL 1 % IJ SOLN
INTRAMUSCULAR | Status: AC
  Filled 2017-10-11: qty 20

## 2017-10-11 MED ORDER — FUROSEMIDE 10 MG/ML IJ SOLN
40.0000 mg | Freq: Every day | INTRAMUSCULAR | Status: DC
  Administered 2017-10-11 – 2017-10-12 (×2): 40 mg via INTRAVENOUS
  Filled 2017-10-11 (×2): qty 4

## 2017-10-11 NOTE — Progress Notes (Signed)
Palliative:  I came by to have initial consult with Jerry Mcintyre but he is exhausted and recently had 15.5 L paracentesis. He requests time to rest and requests that I come back tomorrow afternoon.   Thank you for this consult.   No charge  Vinie Sill, NP Palliative Medicine Team Pager # [phone] (M-F 8a-5p) Team Phone # [phone] (Nights/Weekends)

## 2017-10-11 NOTE — Progress Notes (Signed)
OT Cancellation Note  Patient Details Name: GREYDEN BESECKER MRN: [id number] DOB: Feb 26, 1955   Cancelled Treatment:    Reason Eval/Treat Not Completed: Other (comment).  Pt fatiqued.    Faustina Gebert 10/11/2017, 4:00 PM  Lesle Chris, OTR/L [phone] 10/11/2017

## 2017-10-11 NOTE — Progress Notes (Signed)
PROGRESS NOTE    Jerry Mcintyre  KYH:[id number] DOB: Jan 29, 1956 DOA: 10/08/2017 PCP: Otis Brace, MD   Brief Narrative: Jerry Mcintyre is a 62 y.o. malewith medical history significant of chronic hepatitis C, decompensated hepatic cirrhosis, hepatocellular carcinoma, CKD 3, cachexia,thrombocytopenia. He presented secondary to abdominal distension and dyspnea. He has received one paracentesis to date.   Assessment & Plan:   Active Problems:   Thrombocytopenia (HCC)   Ascites   Anasarca   Abdominal distension   Chronic hepatitis C without hepatic coma (HCC)   Protein-calorie malnutrition, severe   Hyperkalemia   Hyponatremia   AKI (acute kidney injury) ([place])   Shortness of breath   Decompensated hepatic cirrhosis Hepatocellular carcinoma Hepatitis C Patient with a history of requiring multiple paracenteses. He is s/p paracentesis on 9/8 yielding 8 L of fluid. Currently on Rocephin for SBP prophylaxis. Fluid analysis does not appear to support SBP -Discontinue ceftriaxone -Repeat paracentesis today -Continue lactulose, may need to increase frequency if no bowel movement today -Palliative care recommendations  Hyponatremia Secondary to fluid overload in setting of above. Initially improved, now down slightly. -Increase to Lasix 40 mg daily -Repeat BMP in AM  Hyperkalemia In setting of acute kidney injury. Up again today. -Lasix as above -Recheck potassium this afternoon  Acute kidney injury on CKD stage III Baseline creatinine of 1.5-1.7. Peak of 2.8 this admission. Trended down initially, now stable. Not at baseline. In setting of fluid overload. -Continue Lasix as above  Thrombocytopenia In setting of liver disease. Stable. -Trend  Anemia Chronic. Stable.  Leukopenia Transient. Resolved.   DVT prophylaxis: SCDs Code Status:   Code Status: DNR Family Communication: None at bedside Disposition Plan: Discharge pending improvement of ascites,  hyponatremia, AKI   Consultants:   Palliative care medicine  Interventional radiology  Procedures:   9/8: Paracentesis  Antimicrobials:  Ceftriaxone    Subjective: No chest pain, abdominal pain, dyspnea. Did not work with PT yesterday secondary to leg pain but agreed to work today. Will see palliative care today.  Objective: Vitals:   10/09/17 2048 10/10/17 0429 10/10/17 2110 10/11/17 0504  BP: 103/80 116/74 108/71 116/72  Pulse: 88 89 87 92  Resp: 18 18 18 18   Temp: 98 F (36.7 C) 98.5 F (36.9 C) 97.8 F (36.6 C) 98.2 F (36.8 C)  TempSrc: Oral Oral    SpO2: 98% 98% 96% 99%  Weight:  84.1 kg    Height:        Intake/Output Summary (Last 24 hours) at 10/11/2017 1021 Last data filed at 10/11/2017 0615 Gross per 24 hour  Intake 492.32 ml  Output 725 ml  Net -232.68 ml   Filed Weights   10/08/17 2228 10/09/17 0437 10/10/17 0429  Weight: 92.3 kg 93 kg 84.1 kg    Examination:  General exam: Appears calm and comfortable Respiratory system: Clear to auscultation. Respiratory effort normal. Cardiovascular system: S1 & S2 heard, RRR. No murmurs, rubs, gallops or clicks. Gastrointestinal system: Abdomen is significantly distended, soft and nontender. No organomegaly or masses felt. Normal bowel sounds heard. Central nervous system: Alert and oriented. No focal neurological deficits. Extremities: 2+ LE edema. No calf tenderness Skin: No cyanosis. No rashes Psychiatry: Judgement and insight appear normal. Mood & affect appropriate.     Data Reviewed: I have personally reviewed following labs and imaging studies  CBC: Recent Labs  Lab 10/08/17 1835 10/09/17 0414 10/10/17 0440 10/11/17 0356  WBC 4.0 3.1* 4.3 4.1  NEUTROABS 2.5  --   --   --  HGB 9.2* 10.1* 10.8* 10.6*  HCT 26.3* 28.3* 30.2* 29.7*  MCV 94.3 94.0 94.1 94.9  PLT 93* 107* 91* 93*   Basic Metabolic Panel: Recent Labs  Lab 10/08/17 1835 10/08/17 2107 10/09/17 0414 10/10/17 0440  10/10/17 1635 10/11/17 0356  NA 126* 126* 127* 127*  --  126*  K 5.8* 5.2* 5.2* 5.6* 5.4* 5.7*  CL 96* 96* 96* 96*  --  96*  CO2 20* 22 24 25   --  22  GLUCOSE 116* 118* 99 104*  --  108*  BUN 40* 41* 43* 40*  --  44*  CREATININE QUANTITY NOT SUFFICIENT, UNABLE TO PERFORM TEST 2.80*  2.76* 2.70* 2.62*  --  2.64*  CALCIUM 9.0 9.0 9.0 9.0  --  8.9  MG 2.4  --  2.5*  --   --   --   PHOS  --   --  2.9  --   --   --    GFR: Estimated Creatinine Clearance: 28.6 mL/min (A) (by C-G formula based on SCr of 2.64 mg/dL (H)). Liver Function Tests: Recent Labs  Lab 10/08/17 1835 10/09/17 0414 10/10/17 0440  AST 73* 75* 64*  ALT 25 28 23   ALKPHOS 65 61 56  BILITOT 3.4* 2.3* 2.5*  PROT 7.6 7.8 7.2  ALBUMIN 3.2* 3.5 3.0*   No results for input(s): LIPASE, AMYLASE in the last 168 hours. Recent Labs  Lab 10/10/17 0550  AMMONIA 58*   Coagulation Profile: Recent Labs  Lab 10/08/17 1938 10/09/17 0414  INR 1.29 1.34   Cardiac Enzymes: No results for input(s): CKTOTAL, CKMB, CKMBINDEX, TROPONINI in the last 168 hours. BNP (last 3 results) No results for input(s): PROBNP in the last 8760 hours. HbA1C: No results for input(s): HGBA1C in the last 72 hours. CBG: No results for input(s): GLUCAP in the last 168 hours. Lipid Profile: No results for input(s): CHOL, HDL, LDLCALC, TRIG, CHOLHDL, LDLDIRECT in the last 72 hours. Thyroid Function Tests: Recent Labs    10/09/17 0414  TSH 2.500   Anemia Panel: No results for input(s): VITAMINB12, FOLATE, FERRITIN, TIBC, IRON, RETICCTPCT in the last 72 hours. Sepsis Labs: Recent Labs  Lab 10/08/17 1849  LATICACIDVEN 1.72    Recent Results (from the past 240 hour(s))  Culture, body fluid-bottle     Status: None (Preliminary result)   Collection Time: 10/09/17  8:19 AM  Result Value Ref Range Status   Specimen Description PERITONEAL  Final   Special Requests NONE  Final   Culture   Final    NO GROWTH 2 DAYS Performed at [facility], [address] [place], [place] [address]    Report Status PENDING  Incomplete  Gram stain     Status: None   Collection Time: 10/09/17  8:19 AM  Result Value Ref Range Status   Specimen Description PERITONEAL  Final   Special Requests NONE  Final   Gram Stain   Final    RARE WBC PRESENT, PREDOMINANTLY MONONUCLEAR NO ORGANISMS SEEN Performed at [facility], [address], [place], [place] [address]    Report Status 10/09/2017 FINAL  Final         Radiology Studies: No results found.      Scheduled Meds: . feeding supplement (PRO-STAT SUGAR FREE 64)  30 mL Oral BID  . furosemide  40 mg Intravenous Daily  . lactose free nutrition  237 mL Oral TID WC  . lactulose  20 g Oral Daily  .  sodium chloride flush  3 mL Intravenous Q12H  . thiamine  100 mg Oral Daily   Continuous Infusions: . sodium chloride Stopped (10/09/17 2333)  . cefTRIAXone (ROCEPHIN)  IV 2 g (10/10/17 2241)     LOS: 3 days     Cordelia Poche, MD [facility] 10/11/2017, 10:21 AM Pager: [phone]  If 7PM-7AM, please contact night-coverage [email or web address] 10/11/2017, 10:21 AM

## 2017-10-11 NOTE — Progress Notes (Signed)
PT Cancellation Note  Patient Details Name: Jerry Mcintyre MRN: [id number] DOB: 1955/10/04   Cancelled Treatment:    Reason Eval/Treat Not Completed: 3rd refusal from pt to participate with PT. Will sign off at this time.  Please reorder once pt is willing to participate with PT. Thanks.     Weston Anna, PT Acute Rehabilitation Services Pager: [phone] Office: [phone]

## 2017-10-11 NOTE — Progress Notes (Signed)
OT Cancellation Note  Patient Details Name: JERRED ZAREMBA MRN: [id number] DOB: 1955/05/27   Cancelled Treatment:    Reason Eval/Treat Not Completed: Patient at procedure or test/ unavailable  Bed not in room - will check back on pt. Kari Baars, [place]  Payton Mccallum D 10/11/2017, 1:20 PM

## 2017-10-11 NOTE — Care Management Important Message (Signed)
Important Message  Patient Details  Name: AHAN EISENBERGER MRN: [id number] Date of Birth: 1955/03/10   Medicare Important Message Given:  Yes    Kerin Salen 10/11/2017, 10:24 AMImportant Message  Patient Details  Name: NAMON VILLARIN MRN: [id number] Date of Birth: Nov 05, 1955   Medicare Important Message Given:  Yes    Kerin Salen 10/11/2017, 10:24 AM

## 2017-10-11 NOTE — Procedures (Signed)
PROCEDURE SUMMARY:  Successful image-guided paracentesis from the right lower lateral abdomen.  Yielded 15.5 liters of golden yellow fluid.  No immediate complications - procedure was stopped at 15.5 liters due to concern for developing hypotension in setting of poor renal function without albumin administration per ordering provider. Residual fluid remains in abdomen post-procedure, patient reports improvement in symptoms. He denies any complaints.   Patient tolerated well.   Specimen was not sent for labs.  Joaquim Nam PA-C 10/11/2017 12:21 PM

## 2017-10-12 DIAGNOSIS — K7460 Unspecified cirrhosis of liver: Secondary | ICD-10-CM

## 2017-10-12 LAB — BASIC METABOLIC PANEL
ANION GAP: 6 (ref 5–15)
BUN: 45 mg/dL — ABNORMAL HIGH (ref 8–23)
CHLORIDE: 97 mmol/L — AB (ref 98–111)
CO2: 24 mmol/L (ref 22–32)
Calcium: 8.5 mg/dL — ABNORMAL LOW (ref 8.9–10.3)
Creatinine, Ser: 2.56 mg/dL — ABNORMAL HIGH (ref 0.61–1.24)
GFR calc non Af Amer: 25 mL/min — ABNORMAL LOW (ref >60)
GFR, EST AFRICAN AMERICAN: 29 mL/min — AB (ref >60)
Glucose, Bld: 112 mg/dL — ABNORMAL HIGH (ref 70–99)
POTASSIUM: 5.8 mmol/L — AB (ref 3.5–5.1)
SODIUM: 127 mmol/L — AB (ref 135–145)

## 2017-10-12 LAB — CBC
HEMATOCRIT: 29.6 % — AB (ref 39.0–52.0)
HEMOGLOBIN: 10.7 g/dL — AB (ref 13.0–17.0)
MCH: 33.9 pg (ref 26.0–34.0)
MCHC: 36.1 g/dL — ABNORMAL HIGH (ref 30.0–36.0)
MCV: 93.7 fL (ref 78.0–100.0)
Platelets: 80 10*3/uL — ABNORMAL LOW (ref 150–400)
RBC: 3.16 MIL/uL — AB (ref 4.22–5.81)
RDW: 15.6 % — ABNORMAL HIGH (ref 11.5–15.5)
WBC: 4.7 10*3/uL (ref 4.0–10.5)

## 2017-10-12 LAB — POTASSIUM: Potassium: 4.4 mmol/L (ref 3.5–5.1)

## 2017-10-12 LAB — MAGNESIUM: Magnesium: 2.3 mg/dL (ref 1.7–2.4)

## 2017-10-12 MED ORDER — NITROGLYCERIN 0.4 MG SL SUBL: 0.4000 mg | SUBLINGUAL_TABLET | SUBLINGUAL | Status: DC | PRN

## 2017-10-12 MED ORDER — FUROSEMIDE 40 MG PO TABS
40.0000 mg | ORAL_TABLET | Freq: Every day | ORAL | Status: DC
  Administered 2017-10-13 – 2017-10-14 (×2): 40 mg via ORAL
  Filled 2017-10-12 (×2): qty 1

## 2017-10-12 MED ORDER — SODIUM POLYSTYRENE SULFONATE 15 GM/60ML PO SUSP
30.0000 g | Freq: Once | ORAL | Status: AC
  Administered 2017-10-12: 30 g via ORAL
  Filled 2017-10-12: qty 120

## 2017-10-12 NOTE — Progress Notes (Signed)
Patient had 17 beats of V-Tach. Asymptomaic. Vitals: 98.71F;HR 78;RR 14; 93/63;MAP 74; 97% RM AIR. PCP notified

## 2017-10-12 NOTE — Progress Notes (Addendum)
PROGRESS NOTE    LAKIN ROMER  HKV:[id number] DOB: Jun 23, 1955 DOA: 10/08/2017 PCP: Otis Brace, MD   Brief Narrative: Jerry Mcintyre is a 62 y.o. malewith medical history significant of chronic hepatitis C, decompensated hepatic cirrhosis, hepatocellular carcinoma, CKD 3, cachexia,thrombocytopenia. He presented secondary to abdominal distension and dyspnea.  Patient underwent paracentesis x2 during this hospitalization.  He underwent  palliative care evaluation today.  Assessment & Plan:   Active Problems:   Thrombocytopenia (HCC)   Ascites   Anasarca   Abdominal distension   Chronic hepatitis C without hepatic coma (HCC)   Protein-calorie malnutrition, severe   Hyperkalemia   Hyponatremia   AKI (acute kidney injury) ([place])   Shortness of breath   Decompensated hepatic cirrhosis Hepatitis C Patient with a history of requiring multiple paracenteses. He is s/p paracentesis on 9/8 yielding 8 L of fluid.  He underwent repeat paracentesis yesterday with removal of more than 15 L of fluid .Fluid analysis does not appear to support SBP so antibiotics discontinued. Continue lactulose, may need to increase frequency if no bowel movement.  Hyponatremia Due to hypervolemic hyponatremia. Continue lasix Repeat BMP in AM  Hyperkalemia In setting of acute kidney injury. Up again today.  Potassium 5.8 this morning.  S/P Kayexalate with improvement of K level  Acute kidney injury on CKD stage III Baseline creatinine of 1.5-1.7.  Kidney function slowly improving  Thrombocytopenia In setting of liver disease. Stable.    Anemia Chronic. Stable.  Leukopenia Transient. Resolved.  Advanced comorbidities/deconditioning/debility: Patient seen by palliative care today.  Patient interested on comfort care and residential hospice.  Social worker consulted.   DVT prophylaxis: SCD Code Status: DNR Family Communication: None present at the bed side Disposition Plan: Likely  residential hospice  Consultants: Palliative care  Procedures: Paracentesis  Antimicrobials: None  Subjective: Patient seen and examined the bedside this morning.  Remains comfortable today.  Denies any abdominal pain or shortness of breath.  Hemodynamically stable.  Objective: Vitals:   10/11/17 1346 10/11/17 2105 10/12/17 0448 10/12/17 0745  BP: 101/69 90/60 97/64  102/66  Pulse: 75 73 81 79  Resp: 17 18 18 16   Temp: 98.4 F (36.9 C) 97.9 F (36.6 C) 97.8 F (36.6 C) 98.1 F (36.7 C)  TempSrc: Oral Oral Oral Oral  SpO2: 98% 99% 96% 100%  Weight:      Height:        Intake/Output Summary (Last 24 hours) at 10/12/2017 1415 Last data filed at 10/12/2017 1252 Gross per 24 hour  Intake 580 ml  Output 1700 ml  Net -1120 ml   Filed Weights   10/08/17 2228 10/09/17 0437 10/10/17 0429  Weight: 92.3 kg 93 kg 84.1 kg    Examination:  General exam: Appears calm and comfortable ,Not in distress,thin/cachetic HEENT:PERRL,Oral mucosa moist, Ear/Nose normal on gross exam Respiratory system: Bilateral equal air entry, normal vesicular breath sounds, no wheezes or crackles  Cardiovascular system: S1 & S2 heard, RRR. No JVD, murmurs, rubs, gallops or clicks. No pedal edema. Gastrointestinal system: Abdomen is mildly distended, soft and nontender. Normal bowel sounds heard. Central nervous system: Alert and oriented. No focal neurological deficits. Extremities: No edema, no clubbing ,no cyanosis, distal peripheral pulses palpable. Skin: Diffuse ecchymosis throughout the body, no lesions or ulcers,no icterus ,no pallor  Data Reviewed: I have personally reviewed following labs and imaging studies  CBC: Recent Labs  Lab 10/08/17 1835 10/09/17 0414 10/10/17 0440 10/11/17 0356 10/12/17 0412  WBC 4.0 3.1* 4.3 4.1 4.7  NEUTROABS 2.5  --   --   --   --   HGB 9.2* 10.1* 10.8* 10.6* 10.7*  HCT 26.3* 28.3* 30.2* 29.7* 29.6*  MCV 94.3 94.0 94.1 94.9 93.7  PLT 93* 107* 91* 93* 80*    Basic Metabolic Panel: Recent Labs  Lab 10/08/17 1835 10/08/17 2107 10/09/17 0414 10/10/17 0440 10/10/17 1635 10/11/17 0356 10/11/17 1543 10/12/17 0412  NA 126* 126* 127* 127*  --  126*  --  127*  K 5.8* 5.2* 5.2* 5.6* 5.4* 5.7* 4.9 5.8*  CL 96* 96* 96* 96*  --  96*  --  97*  CO2 20* 22 24 25   --  22  --  24  GLUCOSE 116* 118* 99 104*  --  108*  --  112*  BUN 40* 41* 43* 40*  --  44*  --  45*  CREATININE QUANTITY NOT SUFFICIENT, UNABLE TO PERFORM TEST 2.80*  2.76* 2.70* 2.62*  --  2.64*  --  2.56*  CALCIUM 9.0 9.0 9.0 9.0  --  8.9  --  8.5*  MG 2.4  --  2.5*  --   --   --   --  2.3  PHOS  --   --  2.9  --   --   --   --   --    GFR: Estimated Creatinine Clearance: 29.5 mL/min (A) (by C-G formula based on SCr of 2.56 mg/dL (H)). Liver Function Tests: Recent Labs  Lab 10/08/17 1835 10/09/17 0414 10/10/17 0440  AST 73* 75* 64*  ALT 25 28 23   ALKPHOS 65 61 56  BILITOT 3.4* 2.3* 2.5*  PROT 7.6 7.8 7.2  ALBUMIN 3.2* 3.5 3.0*   No results for input(s): LIPASE, AMYLASE in the last 168 hours. Recent Labs  Lab 10/10/17 0550  AMMONIA 58*   Coagulation Profile: Recent Labs  Lab 10/08/17 1938 10/09/17 0414  INR 1.29 1.34   Cardiac Enzymes: No results for input(s): CKTOTAL, CKMB, CKMBINDEX, TROPONINI in the last 168 hours. BNP (last 3 results) No results for input(s): PROBNP in the last 8760 hours. HbA1C: No results for input(s): HGBA1C in the last 72 hours. CBG: No results for input(s): GLUCAP in the last 168 hours. Lipid Profile: No results for input(s): CHOL, HDL, LDLCALC, TRIG, CHOLHDL, LDLDIRECT in the last 72 hours. Thyroid Function Tests: No results for input(s): TSH, T4TOTAL, FREET4, T3FREE, THYROIDAB in the last 72 hours. Anemia Panel: No results for input(s): VITAMINB12, FOLATE, FERRITIN, TIBC, IRON, RETICCTPCT in the last 72 hours. Sepsis Labs: Recent Labs  Lab 10/08/17 1849  LATICACIDVEN 1.72    Recent Results (from the past 240 hour(s))   Culture, body fluid-bottle     Status: None (Preliminary result)   Collection Time: 10/09/17  8:19 AM  Result Value Ref Range Status   Specimen Description PERITONEAL  Final   Special Requests NONE  Final   Culture   Final    NO GROWTH 3 DAYS Performed at [facility], [address], [place], [place] [address]    Report Status PENDING  Incomplete  Gram stain     Status: None   Collection Time: 10/09/17  8:19 AM  Result Value Ref Range Status   Specimen Description PERITONEAL  Final   Special Requests NONE  Final   Gram Stain   Final    RARE WBC PRESENT, PREDOMINANTLY MONONUCLEAR NO ORGANISMS SEEN Performed at [facility], [address], [place], [place] [address]    Report  Status 10/09/2017 FINAL  Final         Radiology Studies: US Paracentesis  Result Date: 10/11/2017 Joaquim Nam, PA-C     10/11/2017  1:19 PM PROCEDURE SUMMARY: Successful image-guided paracentesis from the right lower lateral abdomen. Yielded 15.5 liters of golden yellow fluid. No immediate complications - procedure was stopped at 15.5 liters due to concern for developing hypotension in setting of poor renal function without albumin administration per ordering provider. Residual fluid remains in abdomen post-procedure, patient reports improvement in symptoms. He denies any complaints. Patient tolerated well. Specimen was not sent for labs. Joaquim Nam PA-C 10/11/2017 12:21 PM        Scheduled Meds: . feeding supplement (PRO-STAT SUGAR FREE 64)  30 mL Oral BID  . furosemide  40 mg Intravenous Daily  . lactose free nutrition  237 mL Oral TID WC  . lactulose  20 g Oral Daily  . sodium chloride flush  3 mL Intravenous Q12H  . thiamine  100 mg Oral Daily   Continuous Infusions: . sodium chloride Stopped (10/09/17 2333)     LOS: 4 days    Time spent: 25 mins.More than 50% of that time was spent in counseling and/or coordination of care.      Shelly Coss,  MD [facility] Pager [phone]  If 7PM-7AM, please contact night-coverage [email or web address] Password [email or web address] 10/12/2017, 2:15 PM

## 2017-10-12 NOTE — Progress Notes (Signed)
Patient had elevated heart rate in the 140s,  Vital 93/54, 64, 100%-RA. patient denies any pain/distress, notified the on call provider K.Schorr-NP. No new order give, also reported off to night shift nurse to F/U with plan of care.

## 2017-10-12 NOTE — Evaluation (Signed)
Occupational Therapy Evaluation Patient Details Name: Jerry Mcintyre MRN: [id number] DOB: 05/10/1955 Today's Date: 10/12/2017    History of Present Illness 62 y.o. male with medical history significant of  chronic hepatitis C, decompensated hepatic cirrhosis, hepatocellular carcinoma, CKD 3, cachexia thrombocytopenia. Pt admitted with abdominal distention and shortness of breath   Clinical Impression   Pt admitted with the above. Pt currently with functional limitations due to the deficits listed below (see OT Problem List).  Pt will benefit from skilled OT to increase their safety and independence with ADL and functional mobility for ADL to facilitate discharge to venue listed below.      Follow Up Recommendations  SNF;Home health OT;Supervision/Assistance - 24 hour    Equipment Recommendations  3 in 1 bedside commode    Recommendations for Other Services       Precautions / Restrictions Precautions Precautions: Fall      Mobility Bed Mobility Overal bed mobility: Needs Assistance Bed Mobility: Supine to Sit;Sit to Supine     Supine to sit: Min assist Sit to supine: Min assist   General bed mobility comments: max encouragement  Transfers Overall transfer level: Needs assistance Equipment used: Rolling walker (2 wheeled) Transfers: Sit to/from [facility] Sit to Stand: Mod assist;Min assist Stand pivot transfers: Mod assist;Min assist       General transfer comment: max encouragement        ADL either performed or assessed with clinical judgement   ADL Overall ADL's : Needs assistance/impaired Eating/Feeding: Supervision/ safety   Grooming: Set up;Sitting   Upper Body Bathing: Set up;Sitting   Lower Body Bathing: Sit to/from stand;Cueing for safety;Cueing for compensatory techniques   Upper Body Dressing : Set up;Sitting   Lower Body Dressing: Moderate assistance;Sit to/from stand;Cueing for safety                        Vision Patient Visual Report: No change from baseline              Pertinent Vitals/Pain Pain Assessment: 0-10 Pain Score: 2  Pain Location: general Pain Descriptors / Indicators: Sore Pain Intervention(s): Limited activity within patient's tolerance;Repositioned     Hand Dominance Left   Extremity/Trunk Assessment Upper Extremity Assessment Upper Extremity Assessment: Generalized weakness           Communication Communication Communication: No difficulties   Cognition Arousal/Alertness: Awake/alert Behavior During Therapy: WFL for tasks assessed/performed Overall Cognitive Status: Within Functional Limits for tasks assessed                                        Exercises     Shoulder Instructions      Home Living Family/patient expects to be discharged to:: Private residence Living Arrangements: Alone Available Help at Discharge: Family Type of Home: House Home Access: Ramped entrance     Home Layout: One level     Bathroom Shower/Tub: Teacher, early years/pre: Standard     Home Equipment: Environmental consultant - 2 wheels;Shower seat;Grab bars - toilet;Grab bars - tub/shower          Prior Functioning/Environment Level of Independence: Independent                 OT Problem List: Decreased strength;Decreased activity tolerance;Impaired balance (sitting and/or standing);Decreased safety awareness;Decreased knowledge of use of DME or AE      OT  Treatment/Interventions: Self-care/ADL training;Therapeutic activities;DME and/or AE instruction;Therapeutic exercise;Patient/family education    OT Goals(Current goals can be found in the care plan section) Acute Rehab OT Goals Patient Stated Goal: honme OT Goal Formulation: With patient Time For Goal Achievement: 10/19/17 ADL Goals Pt Will Perform Lower Body Bathing: with modified independence Pt Will Perform Lower Body Dressing: with modified independence;sit to/from stand Pt  Will Transfer to Toilet: with modified independence;regular height toilet Pt Will Perform Toileting - Clothing Manipulation and hygiene: with modified independence;sit to/from stand  OT Frequency: Min 2X/week   Barriers to D/C: Decreased caregiver support             AM-PAC PT "6 Clicks" Daily Activity     Outcome Measure Help from another person eating meals?: A Little Help from another person taking care of personal grooming?: A Little Help from another person toileting, which includes using toliet, bedpan, or urinal?: A Lot Help from another person bathing (including washing, rinsing, drying)?: A Little Help from another person to put on and taking off regular upper body clothing?: A Little Help from another person to put on and taking off regular lower body clothing?: A Lot 6 Click Score: 16   End of Session Equipment Utilized During Treatment: Rolling walker Nurse Communication: Mobility status  Activity Tolerance: Patient limited by fatigue;Treatment limited secondary to agitation Patient left: in bed;with call bell/phone within reach;with chair alarm set  OT Visit Diagnosis: Unsteadiness on feet (R26.81);Other abnormalities of gait and mobility (R26.89);Muscle weakness (generalized) (M62.81);History of falling (Z91.81)                Time: 2620-3559 OT Time Calculation (min): 12 min Charges:  OT General Charges $OT Visit: 1 Visit OT Evaluation $OT Eval Moderate Complexity: [place], [place]  Betsy Pries 10/12/2017, 5:45 PM

## 2017-10-12 NOTE — Progress Notes (Signed)
Patient denies any chest pain. In bed resting.

## 2017-10-12 NOTE — Progress Notes (Signed)
Patient C/O mid chest pain, vitals 102/66, 79, 16, 100%-RA, PRN pain med and tums given Dr. Tawanna Solo notified, EKG done, will continue to assess patient

## 2017-10-12 NOTE — Care Management Note (Signed)
Case Management Note  Patient Details  Name: WENDELIN BRADT MRN: [id number] Date of Birth: Feb 12, 1955  Subjective/Objective:    Pt admitted with Thrombocytopenia, Ascites, Anasarca                Action/Plan: Plan to discharge home, question to [facility]. Pt states that his sisters help him with everything.    Expected Discharge Date:  10/13/17               Expected Discharge Plan:  [place]  In-House Referral:  Clinical Social Work  Discharge planning Services  CM Consult  Post Acute Care Choice:    Choice offered to:     DME Arranged:    DME Agency:     HH Arranged:    [place] Agency:     Status of Service:  In process, will continue to follow  If discussed at Long Length of Stay Meetings, dates discussed:    Additional CommentsPurcell Mouton, RN 10/12/2017, 2:35 PM

## 2017-10-12 NOTE — Consult Note (Signed)
Consultation Note Date: 10/12/2017   Patient Name: Jerry Mcintyre  DOB: 25-Feb-1955  MRN: [id number]  Age / Sex: 62 y.o., male  PCP: Otis Brace, MD Referring Physician: Shelly Coss, MD  Reason for Consultation: Establishing goals of care  HPI/Patient Profile: 62 y.o. male  with past medical history of chronic hepatitis C, decompensated hepaticcirrhosis, hepatocellular carcinoma (treated by embolization 06/16/2017 at [facility]), CKD 3, cachexia, thrombocytopenia admitted on 10/08/2017 with SOB r/t recurrent large ascites and also found to have worsening renal function with continued hyperkalemia and hyponatremia.   Clinical Assessment and Goals of Care: I have spent time reviewing Jerry Mcintyre electronic chart and recommendations. He was seen previously by palliative care and a MOST form was completed at that time for DNR, limited interventions with return to the hospital, antibiotics if indicated and no feeding tubes. He was evaluated by [facility] liver transplant team and was reportedly on the liver transplant list but since has been removed for consideration. His cirrhosis has continued to worsen and he has required recurrent large volume paracentesis with little improvement over this hospitalization.   Jerry Mcintyre refused to speak with me yesterday as he was exhausted after paracentesis and requested I come back this afternoon. When I come to visit with Jerry Mcintyre today he appears equally exhausted and appears to struggle to even hold his eyes open. He is sad and tearful and tells me again that he does not feel up to talking. However, when I asked him if there was anything that I could do to help him feel better he does share his thoughts with me. He tells me "I have been thinking about palliative and hospice" and he confirms that he thinks this might be a good option for him. When I ask him if he is referring to  having hospice at home he clarifies that he is beginning to think that he might be best cared for at hospice facility. I confirmed with him that this is a big step but he does appear tired and exhausted and sometimes people have given all they have. I confirm that hospice will be very good for him if his goal is for peace and comfort. He says that he would like to think about this a little longer. I provided reassurance and emotional support in which he expressed appreciation.   I will follow back up tomorrow to attempt to confirm Jerry Mcintyre desires as he is considering comfort care at hospice facility.   Primary Decision Maker PATIENT    SUMMARY OF RECOMMENDATIONS   - Patient considering comfort care and transition to hospice facility  Code Status/Advance Care Planning:  DNR   Symptom Management:   Pain in abd/chest: OxyIR 5 mg every 4 hours prn. Asked RN to take him medication now. Consider increasing dose and/or frequency as needed.   Paracentesis: If goal is for comfort may consider drain for relief as a comfort measure.   Palliative Prophylaxis:   Aspiration, Bowel Regimen, Delirium Protocol, Frequent Pain Assessment, Oral Care and Turn  Reposition  Psycho-social/Spiritual:   Desire for further Chaplaincy support:yes  Additional Recommendations: Education on Hospice and Grief/Bereavement Support  Prognosis:   Jerry Mcintyre and I believe that prognosis would be very poor < 2 weeks especially with transition to comfort focused care.   Discharge Planning: To Be Determined      Primary Diagnoses: Present on Admission: . Abdominal distension . Anasarca . Ascites . Chronic hepatitis C without hepatic coma ([place]) . Protein-calorie malnutrition, severe . Thrombocytopenia ([place]) . Hyperkalemia . Hyponatremia . AKI (acute kidney injury) ([place])   I have reviewed the medical record, interviewed the patient and family, and examined the patient. The  following aspects are pertinent.  Past Medical History:  Diagnosis Date  . Hernia, abdominal   . Hypertension   . Slipped disc in neck    Social History   Socioeconomic History  . Marital status: Single    Spouse name: Not on file  . Number of children: Not on file  . Years of education: Not on file  . Highest education level: Not on file  Occupational History  . Not on file  Social Needs  . Financial resource strain: Not on file  . Food insecurity:    Worry: Not on file    Inability: Not on file  . Transportation needs:    Medical: Not on file    Non-medical: Not on file  Tobacco Use  . Smoking status: Former Smoker    Last attempt to quit: 11/01/2016    Years since quitting: 0.9  . Smokeless tobacco: Never Used  Substance and Sexual Activity  . Alcohol use: Yes    Comment: "drink a beer in the summertime"  . Drug use: No  . Sexual activity: Not on file  Lifestyle  . Physical activity:    Days per week: Not on file    Minutes per session: Not on file  . Stress: Not on file  Relationships  . Social connections:    Talks on phone: Not on file    Gets together: Not on file    Attends religious service: Not on file    Active member of club or organization: Not on file    Attends meetings of clubs or organizations: Not on file    Relationship status: Not on file  Other Topics Concern  . Not on file  Social History Narrative  . Not on file   Family History  Problem Relation Age of Onset  . Stroke Mother   . Hypertension Mother    Scheduled Meds: . feeding supplement (PRO-STAT SUGAR FREE 64)  30 mL Oral BID  . [START ON 10/13/2017] furosemide  40 mg Oral Daily  . lactose free nutrition  237 mL Oral TID WC  . lactulose  20 g Oral Daily  . sodium chloride flush  3 mL Intravenous Q12H  . thiamine  100 mg Oral Daily   Continuous Infusions: . sodium chloride Stopped (10/09/17 2333)   PRN Meds:.sodium chloride, calcium carbonate, nitroGLYCERIN, ondansetron  **OR** ondansetron (ZOFRAN) IV, oxyCODONE, sodium chloride flush Allergies  Allergen Reactions  . Bee Venom    Review of Systems  Constitutional: Positive for activity change, appetite change and fatigue.  Gastrointestinal: Positive for abdominal pain.  Neurological: Positive for weakness.    Physical Exam  Constitutional: He is oriented to person, place, and time. He appears lethargic. He appears cachectic. He has a sickly appearance.  Cardiovascular: Normal rate. An irregularly irregular rhythm present.  Pulmonary/Chest: Effort  normal. No accessory muscle usage. No tachypnea. No respiratory distress.  Easily fatigued  Abdominal: He exhibits distension and ascites.  Neurological: He is oriented to person, place, and time. He appears lethargic.    Vital Signs: BP 91/70 (BP Location: Left Arm)   Pulse 80   Temp 98.2 F (36.8 C) (Oral)   Resp 16   Ht 5\' 9"  (1.753 m)   Wt 84.1 kg   SpO2 95%   BMI 27.39 kg/m  Pain Scale: 0-10   Pain Score: 0-No pain   SpO2: SpO2: 95 % O2 Device:SpO2: 95 % O2 Flow Rate: .O2 Flow Rate (L/min): 3 L/min  IO: Intake/output summary:   Intake/Output Summary (Last 24 hours) at 10/12/2017 1515 Last data filed at 10/12/2017 1252 Gross per 24 hour  Intake 580 ml  Output 1700 ml  Net -1120 ml    LBM: Last BM Date: 10/08/17 Baseline Weight: Weight: 70.3 kg Most recent weight: Weight: 84.1 kg     Palliative Assessment/Data: 20%     Time In: 1500 Time Out: 1535 Time Total: 35 Greater than 50%  of this time was spent counseling and coordinating care related to the above assessment and plan.  Signed by: Vinie Sill, NP Palliative Medicine Team Pager # [phone] (M-F 8a-5p) Team Phone # [phone] (Nights/Weekends)

## 2017-10-13 LAB — BASIC METABOLIC PANEL
Anion gap: 7 (ref 5–15)
BUN: 45 mg/dL — AB (ref 8–23)
CHLORIDE: 94 mmol/L — AB (ref 98–111)
CO2: 26 mmol/L (ref 22–32)
Calcium: 8.1 mg/dL — ABNORMAL LOW (ref 8.9–10.3)
Creatinine, Ser: 2.37 mg/dL — ABNORMAL HIGH (ref 0.61–1.24)
GFR calc non Af Amer: 28 mL/min — ABNORMAL LOW (ref >60)
GFR, EST AFRICAN AMERICAN: 32 mL/min — AB (ref >60)
Glucose, Bld: 115 mg/dL — ABNORMAL HIGH (ref 70–99)
POTASSIUM: 4.4 mmol/L (ref 3.5–5.1)
SODIUM: 127 mmol/L — AB (ref 135–145)

## 2017-10-13 NOTE — Progress Notes (Signed)
PROGRESS NOTE    BOWMAN HIGBIE  WPY:[id number] DOB: 1955/08/29 DOA: 10/08/2017 PCP: Otis Brace, MD   Brief Narrative: Jerry Mcintyre is a 62 y.o. malewith medical history significant of chronic hepatitis C, decompensated hepatic cirrhosis, hepatocellular carcinoma, CKD 3, cachexia,thrombocytopenia. He presented secondary to abdominal distension and dyspnea.  Patient underwent paracentesis x2 during this hospitalization.  He underwent  palliative care evaluation today.Patient is being considered for discharge to residential hospice.  Awaiting final recommendation from palliative care.SW consulted.  Assessment & Plan:   Active Problems:   Thrombocytopenia (HCC)   Ascites   Anasarca   Abdominal distension   Chronic hepatitis C without hepatic coma (HCC)   Protein-calorie malnutrition, severe   Hyperkalemia   Hyponatremia   AKI (acute kidney injury) ([place])   Shortness of breath   Decompensated hepatic cirrhosis Hepatitis C Patient with a history of requiring multiple paracenteses. He is s/p paracentesis on 9/8 yielding 8 L of fluid.  He underwent repeat paracentesis on 10/12/17 with removal of more than 15 L of fluid .Fluid analysis does not appear to support SBP so antibiotics discontinued. Continue lactulose, may need to increase frequency if no bowel movement.  Hyponatremia Due to hypervolemic hyponatremia. On lasix  Hyperkalemia In setting of acute kidney injury. K level stable todayay  Acute kidney injury on CKD stage III Baseline creatinine of 1.5-1.7.  Kidney function slow to improv  Thrombocytopenia In setting of liver disease. Stable.    Anemia Chronic. Stable.  Leukopenia Transient. Resolved.  Advanced comorbidities/deconditioning/debility: Patient seen by palliative care today.  Patient interested on comfort care and residential hospice.  Social worker has been consulted.   DVT prophylaxis: SCD Code Status: DNR Family Communication: None  present at the bed side Disposition Plan: Likely residential hospice.Awaiting final discussion from palliative care  Consultants: Palliative care  Procedures: Paracentesis x 2  Antimicrobials: None  Subjective: Patient seen and examined the bedside this morning.  Remains comfortable today but very weak.  Denies any abdominal pain or shortness of breath.  Hemodynamically stable.  Objective: Vitals:   10/12/17 1444 10/12/17 2019 10/13/17 0552 10/13/17 0816  BP: 91/70 (!) 84/63 (!) 87/55 90/69  Pulse: 80 94 84 84  Resp: 16 18 18 16   Temp: 98.2 F (36.8 C) 98 F (36.7 C) 98.2 F (36.8 C) 97.7 F (36.5 C)  TempSrc: Oral Oral Oral Oral  SpO2: 95% 100% 98% 100%  Weight:      Height:        Intake/Output Summary (Last 24 hours) at 10/13/2017 1332 Last data filed at 10/13/2017 0913 Gross per 24 hour  Intake 363 ml  Output 700 ml  Net -337 ml   Filed Weights   10/08/17 2228 10/09/17 0437 10/10/17 0429  Weight: 92.3 kg 93 kg 84.1 kg    Examination:  General exam: Appears calm and comfortable ,Not in distress,thin/cachetic HEENT:PERRL,Oral mucosa moist, Ear/Nose normal on gross exam Respiratory system: Bilateral equal air entry, normal vesicular breath sounds, no wheezes or crackles  Cardiovascular system: S1 & S2 heard, RRR. No JVD, murmurs, rubs, gallops or clicks. No pedal edema. Gastrointestinal system: Abdomen is mildly distended, soft and nontender. Normal bowel sounds heard. Central nervous system: Alert and oriented. No focal neurological deficits. Extremities: No edema, no clubbing ,no cyanosis, distal peripheral pulses palpable. Skin: Diffuse ecchymosis throughout the body, no lesions or ulcers,no icterus ,no pallor  Data Reviewed: I have personally reviewed following labs and imaging studies  CBC: Recent Labs  Lab 10/08/17 1835  10/09/17 0414 10/10/17 0440 10/11/17 0356 10/12/17 0412  WBC 4.0 3.1* 4.3 4.1 4.7  NEUTROABS 2.5  --   --   --   --   HGB 9.2*  10.1* 10.8* 10.6* 10.7*  HCT 26.3* 28.3* 30.2* 29.7* 29.6*  MCV 94.3 94.0 94.1 94.9 93.7  PLT 93* 107* 91* 93* 80*   Basic Metabolic Panel: Recent Labs  Lab 10/08/17 1835  10/09/17 0414 10/10/17 0440  10/11/17 0356 10/11/17 1543 10/12/17 0412 10/12/17 1537 10/13/17 0407  NA 126*   < > 127* 127*  --  126*  --  127*  --  127*  K 5.8*   < > 5.2* 5.6*   < > 5.7* 4.9 5.8* 4.4 4.4  CL 96*   < > 96* 96*  --  96*  --  97*  --  94*  CO2 20*   < > 24 25  --  22  --  24  --  26  GLUCOSE 116*   < > 99 104*  --  108*  --  112*  --  115*  BUN 40*   < > 43* 40*  --  44*  --  45*  --  45*  CREATININE QUANTITY NOT SUFFICIENT, UNABLE TO PERFORM TEST   < > 2.70* 2.62*  --  2.64*  --  2.56*  --  2.37*  CALCIUM 9.0   < > 9.0 9.0  --  8.9  --  8.5*  --  8.1*  MG 2.4  --  2.5*  --   --   --   --  2.3  --   --   PHOS  --   --  2.9  --   --   --   --   --   --   --    < > = values in this interval not displayed.   GFR: Estimated Creatinine Clearance: 31.9 mL/min (A) (by C-G formula based on SCr of 2.37 mg/dL (H)). Liver Function Tests: Recent Labs  Lab 10/08/17 1835 10/09/17 0414 10/10/17 0440  AST 73* 75* 64*  ALT 25 28 23   ALKPHOS 65 61 56  BILITOT 3.4* 2.3* 2.5*  PROT 7.6 7.8 7.2  ALBUMIN 3.2* 3.5 3.0*   No results for input(s): LIPASE, AMYLASE in the last 168 hours. Recent Labs  Lab 10/10/17 0550  AMMONIA 58*   Coagulation Profile: Recent Labs  Lab 10/08/17 1938 10/09/17 0414  INR 1.29 1.34   Cardiac Enzymes: No results for input(s): CKTOTAL, CKMB, CKMBINDEX, TROPONINI in the last 168 hours. BNP (last 3 results) No results for input(s): PROBNP in the last 8760 hours. HbA1C: No results for input(s): HGBA1C in the last 72 hours. CBG: No results for input(s): GLUCAP in the last 168 hours. Lipid Profile: No results for input(s): CHOL, HDL, LDLCALC, TRIG, CHOLHDL, LDLDIRECT in the last 72 hours. Thyroid Function Tests: No results for input(s): TSH, T4TOTAL, FREET4, T3FREE,  THYROIDAB in the last 72 hours. Anemia Panel: No results for input(s): VITAMINB12, FOLATE, FERRITIN, TIBC, IRON, RETICCTPCT in the last 72 hours. Sepsis Labs: Recent Labs  Lab 10/08/17 1849  LATICACIDVEN 1.72    Recent Results (from the past 240 hour(s))  Culture, body fluid-bottle     Status: None (Preliminary result)   Collection Time: 10/09/17  8:19 AM  Result Value Ref Range Status   Specimen Description PERITONEAL  Final   Special Requests NONE  Final   Culture   Final    NO  GROWTH 4 DAYS Performed at [facility], [place] [address] [place], [place] [address]    Report Status PENDING  Incomplete  Gram stain     Status: None   Collection Time: 10/09/17  8:19 AM  Result Value Ref Range Status   Specimen Description PERITONEAL  Final   Special Requests NONE  Final   Gram Stain   Final    RARE WBC PRESENT, PREDOMINANTLY MONONUCLEAR NO ORGANISMS SEEN Performed at [facility], [address] [place], [place] [address]    Report Status 10/09/2017 FINAL  Final         Radiology Studies: US Paracentesis  Result Date: 10/11/2017 Joaquim Nam, PA-C     10/11/2017  1:19 PM PROCEDURE SUMMARY: Successful image-guided paracentesis from the right lower lateral abdomen. Yielded 15.5 liters of golden yellow fluid. No immediate complications - procedure was stopped at 15.5 liters due to concern for developing hypotension in setting of poor renal function without albumin administration per ordering provider. Residual fluid remains in abdomen post-procedure, patient reports improvement in symptoms. He denies any complaints. Patient tolerated well. Specimen was not sent for labs. Joaquim Nam PA-C 10/11/2017 12:21 PM        Scheduled Meds: . feeding supplement (PRO-STAT SUGAR FREE 64)  30 mL Oral BID  . furosemide  40 mg Oral Daily  . lactose free nutrition  237 mL Oral TID WC  . lactulose  20 g Oral Daily  . sodium chloride flush  3 mL Intravenous Q12H    . thiamine  100 mg Oral Daily   Continuous Infusions: . sodium chloride Stopped (10/09/17 2333)     LOS: 5 days    Time spent: 25 mins.More than 50% of that time was spent in counseling and/or coordination of care.      Shelly Coss, MD [facility] Pager [phone]  If 7PM-7AM, please contact night-coverage [email or web address] Password [email or web address] 10/13/2017, 1:32 PM

## 2017-10-13 NOTE — Progress Notes (Signed)
Occupational Therapy Treatment Patient Details Name: OMARR HANN MRN: [id number] DOB: [birth year] Today's Date: 10/13/2017    History of present illness 62 y.o. male with medical history significant of  chronic hepatitis C, decompensated hepatic cirrhosis, hepatocellular carcinoma, CKD 3, cachexia thrombocytopenia. Pt admitted with abdominal distention and shortness of breath   OT comments  Pt mentioned he may choose inpatient hospice for Dc plan  Follow Up Recommendations  SNF    Equipment Recommendations  3 in 1 bedside commode    Recommendations for Other Services      Precautions / Restrictions Precautions Precautions: Fall       Mobility Bed Mobility Overal bed mobility: Needs Assistance Bed Mobility: Supine to Sit;Sit to Supine     Supine to sit: Min assist Sit to supine: Min assist   General bed mobility comments: max encouragement  Transfers     Transfers: Sit to/from Stand   Stand pivot transfers: Mod assist       General transfer comment: refused        ADL either performed or assessed with clinical judgement   ADL Overall ADL's : Needs assistance/impaired Eating/Feeding: Supervision/ safety   Grooming: Set up;Sitting                                 General ADL Comments: pt agreed to sit EOB only.  Pt refused to stand. Pt sat for 10 min before returning to supine. Pt needed MAX encouragement     Vision Patient Visual Report: No change from baseline            Cognition Arousal/Alertness: Awake/alert Behavior During Therapy: WFL for tasks assessed/performed Overall Cognitive Status: Within Functional Limits for tasks assessed                                                     Pertinent Vitals/ Pain       Pain Score: 3  Pain Location: general Pain Descriptors / Indicators: Sore Pain Intervention(s): Limited activity within patient's tolerance;Monitored during session;Repositioned          Frequency  Min 2X/week        Progress Toward Goals  OT Goals(current goals can now be found in the care plan section)  Progress towards OT goals: Progressing toward goals     Plan Discharge plan remains appropriate       AM-PAC PT "6 Clicks" Daily Activity     Outcome Measure   Help from another person eating meals?: A Little Help from another person taking care of personal grooming?: A Little Help from another person toileting, which includes using toliet, bedpan, or urinal?: A Lot Help from another person bathing (including washing, rinsing, drying)?: A Little Help from another person to put on and taking off regular upper body clothing?: A Little Help from another person to put on and taking off regular lower body clothing?: A Lot 6 Click Score: 16    End of Session Equipment Utilized During Treatment: Rolling walker  OT Visit Diagnosis: Unsteadiness on feet (R26.81);Other abnormalities of gait and mobility (R26.89);Muscle weakness (generalized) (M62.81);History of falling (Z91.81)   Activity Tolerance Patient limited by fatigue;Treatment limited secondary to agitation   Patient Left in bed;with call bell/phone within reach;with bed alarm set   Nurse  Communication Mobility status        Time: 1045-1102 OT Time Calculation (min): 17 min  Charges: OT General Charges $OT Visit: 1 Visit OT Treatments $Self Care/Home Management : 8-22 mins  [place], [place]   Betsy Pries 10/13/2017, 2:41 PM

## 2017-10-13 NOTE — Progress Notes (Signed)
MD notified of patient voiding 350 cc amber urine on his own. Will continue to monitor and bladder scan prn. Eulas Post, RN

## 2017-10-13 NOTE — Progress Notes (Signed)
PT Cancellation Note  Patient Details Name: Jerry Mcintyre MRN: [id number] DOB: 08-09-1955   Cancelled Treatment:    Reason Eval/Treat Not Completed: Medical issues which prohibited therapy Pt with elevated HR today.  Also pending hospice decision.     Innocence Schlotzhauer,[name] 10/13/2017, 3:48 PM Carmelia Bake, PT, DPT Acute Rehabilitation Services Office: [phone] Pager: [phone]

## 2017-10-13 NOTE — Progress Notes (Signed)
Patient ID: Jerry Mcintyre, male   DOB: January 23, 1956, 62 y.o.   MRN: [id number] Aware of request for tunneled peritoneal drain placement in pt. Pt well known to IR service from multiple paracenteses, latest on 9/10 yielding 15 liters. Details/risks of above procedure, incl but not limited to, internal bleeding, infection, injury to adjacent structures d/w pt. He wishes to discuss matter with family before making final decision. He is also considering hospice care. Will continue to monitor.

## 2017-10-13 NOTE — Progress Notes (Signed)
Palliative:  I met today with Mr. Camerer who is more alert and talkative with me. He shares with me that his sisters are his biggest support especially [place]. He has 2 adult daughters but it does not appear that they are in the picture and he requests for me to call his sisters to discuss the decisions today.  Mr. Holston is clear today that he is tired and acknowledges that he has decided that he wishes to move forward with going to hospice - he names [facility]. We discussed comfort care and he agrees that comfort and dignity is the right focus for him and does not feel the need for more labs and testing at this time. However, his doctors have previously discussed with him (he reports) peritoneal drain and he wishes to pursue so that hospice may utilize drain to ensure comfort for him.   He is not eating although is drinking water. He is so fatigued and I do feel prognosis < 2 weeks with focus on comfort.   I called and spoke with sisters, [name] and Adalberto Cole"), separately at his request. Explained my conversation with their brother and his decision for comfort care, hospice, and peritoneal drain. Answered their questions and addressed their concerns. They are supportive and agree with his decisions as they have witnessed his decline and know he is suffering. They plan to come talk with their brother tomorrow as a family 9/12 ~2-3 pm and are hoping that drain can be placed after this discussion so they can reassure and support him.   [id number], [phone] 69 min  Vinie Sill, NP Palliative Medicine Team Pager # [phone] (M-F 8a-5p) Team Phone # [phone] (Nights/Weekends)

## 2017-10-13 NOTE — Progress Notes (Signed)
Patient has been telling staff that he has voided 3 times this shift. No urinals have been emptied by staff. Bladder scanned and it showed >999 ml in bladder. Patient has no sensation of pressure or to void. Even with pressure applied during scan. Patient "absolutely will not have catheter. Even if I'm dead you better not put one in me". Explained to pt that we could do an In and Out to empty his bladder but he refuses this also. He is drinking water and states "I will pee on my own". Will notify MD and monitor. Eulas Post, RN

## 2017-10-14 DIAGNOSIS — Z7189 Other specified counseling: Secondary | ICD-10-CM

## 2017-10-14 DIAGNOSIS — R338 Other retention of urine: Secondary | ICD-10-CM

## 2017-10-14 DIAGNOSIS — Z515 Encounter for palliative care: Secondary | ICD-10-CM

## 2017-10-14 DIAGNOSIS — R188 Other ascites: Secondary | ICD-10-CM

## 2017-10-14 LAB — CULTURE, BODY FLUID W GRAM STAIN -BOTTLE

## 2017-10-14 LAB — CULTURE, BODY FLUID-BOTTLE: CULTURE: NO GROWTH

## 2017-10-14 MED ORDER — TAMSULOSIN HCL 0.4 MG PO CAPS
0.4000 mg | ORAL_CAPSULE | Freq: Once | ORAL | Status: AC
  Administered 2017-10-14: 0.4 mg via ORAL
  Filled 2017-10-14: qty 1

## 2017-10-14 MED ORDER — TAMSULOSIN HCL 0.4 MG PO CAPS
0.4000 mg | ORAL_CAPSULE | Freq: Every day | ORAL | Status: DC
  Administered 2017-10-14 – 2017-10-18 (×5): 0.4 mg via ORAL
  Filled 2017-10-14 (×5): qty 1

## 2017-10-14 NOTE — Progress Notes (Signed)
Patient refusing to have a foley catheter placed after discussing the benefits with Dr. Tawanna Solo.

## 2017-10-14 NOTE — Progress Notes (Signed)
[facility])  RN Visit  Received request from LCSW for patient interest in [facility].  Chart reviewed, spoke with Amy, his RN today.  Met with patient to confirm interest in [facility]  Pt is alert and oriented, slow to process and respond.  He states he has had experience with [facility], would like to go there, but is not sure that he is ready "yet".  Patient is still thinking he may want a percutaneous drain placed on Monday, and is not ready to leave until he has made that decision.  Explained services, should he decide he would like to go.    Thank you for this referral,  Venia Carbon BSN, [facility] Liaison [phone]

## 2017-10-14 NOTE — Progress Notes (Signed)
Nutrition Brief Note  Chart reviewed. Pt now transitioning to comfort care.  No further nutrition interventions warranted at this time.  Please re-consult as needed.   Elany Felix RD, LDN Clinical Nutrition Pager # - [phone]    

## 2017-10-14 NOTE — Progress Notes (Signed)
Palliative Medicine RN Note:  Rec'd request from PMT AP to follow up with scheduling drain placement in IR. I called IR; pt would not consent yesterday because he wanted to talk to his family, so he is not on the schedule today. IR does not have an opening at this time if he were to decide to proceed with the procedure; he could get it Monday.  I called Dr Domingo Cocking to discuss/update him.  I also called Mr Ebarb's sister. She and Judeth Porch are coming up today when Judeth Porch gets off work at 1100 to discuss the drain. I explained that we have likely lost our ability to get it placed before Monday because he didn't consent with the IR PA yesterday & that he is repeatedly and adamantly refusing urinary catheters. His sisters plan to come as scheduled, talk to him, and then I will touch base with them later.  Marjie Skiff Corrine Tillis, RN, BSN, [facility] Palliative Medicine Team 10/14/2017 9:00 AM Office [phone]

## 2017-10-14 NOTE — Progress Notes (Signed)
Patient speaking with representative from Hospice, Venia Carbon RN, patient educated about the benefits of a foley catheter. Patient declining to have foley catheter placed at this time.

## 2017-10-14 NOTE — Progress Notes (Signed)
Bodenheimer, NP notified that patient was bladder scanned with >999 cc found. Patient voided 150 cc. Order received to give flomax.

## 2017-10-14 NOTE — Progress Notes (Signed)
PROGRESS NOTE    [name]  VXB:[id number] DOB: Dec 12, 1955 DOA: 10/08/2017 PCP: Otis Brace, MD   Brief Narrative: Jerry Mcintyre is a 62 y.o. malewith medical history significant of chronic hepatitis C, decompensated hepatic cirrhosis, hepatocellular carcinoma, CKD 3, cachexia,thrombocytopenia. He presented secondary to abdominal distension and dyspnea.  Patient underwent paracentesis x2 during this hospitalization.  He underwent  palliative care evaluation .Patient is being considered for discharge to residential hospice.  There is plan for placement of peritoneal catheter for drainage of the ascites fluid but patient has not decided yet.  Patient also has developed issues with urinary retention but is denying Foley catheter placement and in and out catheterization.  Assessment & Plan:   Principal Problem:   Ascites Active Problems:   Thrombocytopenia (HCC)   Anasarca   Abdominal distension   Chronic hepatitis C without hepatic coma (HCC)   Protein-calorie malnutrition, severe   Hyperkalemia   Hyponatremia   AKI (acute kidney injury) ([place])   Shortness of breath   Acute urinary retention   Decompensated hepatic cirrhosis Hepatitis C Patient with a history of requiring multiple paracenteses. He is s/p paracentesis on 9/8 yielding 8 L of fluid.  He underwent repeat paracentesis on 10/12/17 with removal of more than 15 L of fluid .Fluid analysis does not appear to support SBP so antibiotics discontinued. Continue lactulose, may need to increase frequency if no bowel movement. Discussion about possible placement of peritoneal catheter placement done for palliative purpose.  Patient has not made his final decision.  IR already evaluated him but since he has not made any decision yet the schedule is not planned for today.  Urinary retention: Patient refusing Foley catheter placement and in and out catheterization.  Extensively  discussed about this issue this morning.  I  explained that if he continues to retain urine and he refuses Foley, there is a chance of acute kidney failure and also possible bladder rupture.  Patient adamantly denies Foley/and out catheterization.  On tamsulosin.  Hyponatremia Due to hypervolemic hyponatremia. On lasix  Hyperkalemia In setting of acute kidney injury. Stable now  Acute kidney injury on CKD stage III Baseline creatinine of 1.5-1.7.  Kidney function slow to improve  Thrombocytopenia In setting of liver disease. Stable.    Anemia Chronic. Stable.  Leukopenia Transient. Resolved.  Advanced comorbidities/deconditioning/debility: Patient seen by palliative care today.  Patient interested on comfort care and residential hospice.  Social worker has been consulted.  Waiting for decision on peritoneal catheter placement   DVT prophylaxis: SCD Code Status: DNR Family Communication: None present at the bed side Disposition Plan: Likely residential hospice.Awaiting final discussion from palliative care, decision on peritoneal catheter placement  Consultants: Palliative care  Procedures: Paracentesis x 2  Antimicrobials: None  Subjective: Patient seen and examined the bedside this morning.  Hemodynamically stable but weak.  His abdomen was found to be distended than yesterday.  Patient is retaining urine. Objective: Vitals:   10/13/17 0816 10/13/17 1559 10/13/17 2153 10/14/17 0501  BP: 90/69 (!) 88/68 95/67 101/65  Pulse: 84 81 94 65  Resp: 16 17 18 18   Temp: 97.7 F (36.5 C) 98.2 F (36.8 C) 97.6 F (36.4 C) 97.7 F (36.5 C)  TempSrc: Oral Oral Oral Oral  SpO2: 100% 98% 95% 94%  Weight:    71.1 kg  Height:        Intake/Output Summary (Last 24 hours) at 10/14/2017 1215 Last data filed at 10/14/2017 0200 Gross per 24 hour  Intake  0 ml  Output 500 ml  Net -500 ml   Filed Weights   10/09/17 0437 10/10/17 0429 10/14/17 0501  Weight: 93 kg 84.1 kg 71.1 kg    Examination:  General exam: Not in  distress,thin/cachetic HEENT:PERRL,Oral mucosa moist, Ear/Nose normal on gross exam Respiratory system: Bilateral equal air entry, normal vesicular breath sounds, no wheezes or crackles  Cardiovascular system: S1 & S2 heard, RRR. No JVD, murmurs, rubs, gallops or clicks. No pedal edema. Gastrointestinal system: Abdomen is distended, tense and nontender. Normal bowel sounds heard. Central nervous system: Alert and oriented. No focal neurological deficits. Extremities: No edema, no clubbing ,no cyanosis, distal peripheral pulses palpable. Skin: Diffuse ecchymosis throughout the body, no lesions or ulcers,no icterus ,no pallor  Data Reviewed: I have personally reviewed following labs and imaging studies  CBC: Recent Labs  Lab 10/08/17 1835 10/09/17 0414 10/10/17 0440 10/11/17 0356 10/12/17 0412  WBC 4.0 3.1* 4.3 4.1 4.7  NEUTROABS 2.5  --   --   --   --   HGB 9.2* 10.1* 10.8* 10.6* 10.7*  HCT 26.3* 28.3* 30.2* 29.7* 29.6*  MCV 94.3 94.0 94.1 94.9 93.7  PLT 93* 107* 91* 93* 80*   Basic Metabolic Panel: Recent Labs  Lab 10/08/17 1835  10/09/17 0414 10/10/17 0440  10/11/17 0356 10/11/17 1543 10/12/17 0412 10/12/17 1537 10/13/17 0407  NA 126*   < > 127* 127*  --  126*  --  127*  --  127*  K 5.8*   < > 5.2* 5.6*   < > 5.7* 4.9 5.8* 4.4 4.4  CL 96*   < > 96* 96*  --  96*  --  97*  --  94*  CO2 20*   < > 24 25  --  22  --  24  --  26  GLUCOSE 116*   < > 99 104*  --  108*  --  112*  --  115*  BUN 40*   < > 43* 40*  --  44*  --  45*  --  45*  CREATININE QUANTITY NOT SUFFICIENT, UNABLE TO PERFORM TEST   < > 2.70* 2.62*  --  2.64*  --  2.56*  --  2.37*  CALCIUM 9.0   < > 9.0 9.0  --  8.9  --  8.5*  --  8.1*  MG 2.4  --  2.5*  --   --   --   --  2.3  --   --   PHOS  --   --  2.9  --   --   --   --   --   --   --    < > = values in this interval not displayed.   GFR: Estimated Creatinine Clearance: 31.9 mL/min (A) (by C-G formula based on SCr of 2.37 mg/dL (H)). Liver Function  Tests: Recent Labs  Lab 10/08/17 1835 10/09/17 0414 10/10/17 0440  AST 73* 75* 64*  ALT 25 28 23   ALKPHOS 65 61 56  BILITOT 3.4* 2.3* 2.5*  PROT 7.6 7.8 7.2  ALBUMIN 3.2* 3.5 3.0*   No results for input(s): LIPASE, AMYLASE in the last 168 hours. Recent Labs  Lab 10/10/17 0550  AMMONIA 58*   Coagulation Profile: Recent Labs  Lab 10/08/17 1938 10/09/17 0414  INR 1.29 1.34   Cardiac Enzymes: No results for input(s): CKTOTAL, CKMB, CKMBINDEX, TROPONINI in the last 168 hours. BNP (last 3 results) No results for input(s): PROBNP in the last  8760 hours. HbA1C: No results for input(s): HGBA1C in the last 72 hours. CBG: No results for input(s): GLUCAP in the last 168 hours. Lipid Profile: No results for input(s): CHOL, HDL, LDLCALC, TRIG, CHOLHDL, LDLDIRECT in the last 72 hours. Thyroid Function Tests: No results for input(s): TSH, T4TOTAL, FREET4, T3FREE, THYROIDAB in the last 72 hours. Anemia Panel: No results for input(s): VITAMINB12, FOLATE, FERRITIN, TIBC, IRON, RETICCTPCT in the last 72 hours. Sepsis Labs: Recent Labs  Lab 10/08/17 1849  LATICACIDVEN 1.72    Recent Results (from the past 240 hour(s))  Culture, body fluid-bottle     Status: None   Collection Time: 10/09/17  8:19 AM  Result Value Ref Range Status   Specimen Description PERITONEAL  Final   Special Requests NONE  Final   Culture   Final    NO GROWTH 5 DAYS Performed at [facility], [address] [place], [place] [address]    Report Status 10/14/2017 FINAL  Final  Gram stain     Status: None   Collection Time: 10/09/17  8:19 AM  Result Value Ref Range Status   Specimen Description PERITONEAL  Final   Special Requests NONE  Final   Gram Stain   Final    RARE WBC PRESENT, PREDOMINANTLY MONONUCLEAR NO ORGANISMS SEEN Performed at [facility], [place] [address], [place], [place] [address]    Report Status 10/09/2017 FINAL  Final         Radiology Studies: No results  found.      Scheduled Meds: . feeding supplement (PRO-STAT SUGAR FREE 64)  30 mL Oral BID  . furosemide  40 mg Oral Daily  . lactose free nutrition  237 mL Oral TID WC  . lactulose  20 g Oral Daily  . sodium chloride flush  3 mL Intravenous Q12H  . tamsulosin  0.4 mg Oral Daily  . thiamine  100 mg Oral Daily   Continuous Infusions: . sodium chloride Stopped (10/09/17 2333)     LOS: 6 days    Time spent: 25 mins.More than 50% of that time was spent in counseling and/or coordination of care.      Shelly Coss, MD [facility] Pager [phone]  If 7PM-7AM, please contact night-coverage [email or web address] Password [facility] 10/14/2017, 12:15 PM

## 2017-10-14 NOTE — Progress Notes (Signed)
Palliative Medicine RN Note: Called pt's sister. They are at the hospital and will return to Mr Timme's room shortly. They said that he told them he wants to get the drain Monday but absolutely doesn't want a catheter. His sisters are asking for help explaining to him that without placing a catheter to drain urine and prevent further problems, an abdominal drain may not be of much benefit.  I updated Dr Domingo Cocking. Sisters will only be here until 4:00, and Dr Domingo Cocking is not immediately available; PMT will attempt contact before 4:00.  Marjie Skiff Zyrah Wiswell, RN, BSN, [facility] Palliative Medicine Team 10/14/2017 1:49 PM Office [phone]

## 2017-10-14 NOTE — Clinical Social Work Note (Signed)
Clinical Social Work Assessment  Patient Details  Name: Jerry Mcintyre MRN: [id number] Date of Birth: [birth year]  Date of referral:  10/12/17               Reason for consult:  End of Life/Hospice                Permission sought to share information with:  Facility Sport and exercise psychologist, Family Supports Permission granted to share information::  Yes, Verbal Permission Granted  Name::     Lurena Joiner, Clydene Laming  Agency::  Hospice ([place])  Relationship::  Sisters  Contact Information:   [phone] [name]), [phone] Judeth Porch)  Housing/Transportation Living arrangements for the past 2 months:  Single Family Home Source of Information:  Patient Patient Interpreter Needed:  None Criminal Activity/Legal Involvement Pertinent to Current Situation/Hospitalization:  No - Comment as needed Significant Relationships:  Siblings Lives with:  Self Do you feel safe going back to the place where you live?  Yes Need for family participation in patient care:  No (Coment)  Care giving concerns:  Per palliative note, patient has  < 2 weeks to live and lived alone PTA. Patient will benefit from inpatient hospice for comfort measures at end of life.    Social Worker assessment / plan:  CSW met with patient at bedside to explain role and discuss discharge plan. Patient was tired but willing to have short conversation with CSW. Patient confirmed preference for [facility]. He states this is his best option for current diagnosis and prefers comfort over more testing.   Patient is close to sisters: Henry Russel: [phone] and Clydene Laming: [phone]. He wishes for them to be kept informed during admission. They live relatively close with one sister living in [place]. Patient was living alone PTA.   CSW made referral to [facility] and will continue to follow for discharge plan.   Employment status:  Retired Forensic scientist:  Medicare PT Recommendations:   Not assessed at this time [place] / Referral to community resources:  Other (Comment Required)([facility])  Patient/Family's Response to care:  Patient is accepting of current treatment and diagnosis. Patient stated he prefers comfort measures at this point and wants inpatient hospice after he has peritoneal drain placed.   Patient/Family's Understanding of and Emotional Response to Diagnosis, Current Treatment, and Prognosis:  Patient is understanding of prognosis and inpatient hospice referral process. CSW explained process and that liaison from [facility] will be speaking with him and family soon. Patient reported understanding.   Emotional Assessment Appearance:  Appears older than stated age Attitude/Demeanor/Rapport:  Lethargic Affect (typically observed):  Accepting, Pleasant Orientation:  Oriented to Self, Oriented to  Time, Oriented to Situation, Oriented to Place Alcohol / Substance use:  Not Applicable Psych involvement (Current and /or in the community):  No (Comment)  Discharge Needs  Concerns to be addressed:  Care Coordination Readmission within the last 30 days:  No Current discharge risk:  Physical Impairment, Terminally ill Barriers to Discharge:  Continued Medical Work up   [employer], [place] 10/14/2017, 10:20 AM

## 2017-10-14 NOTE — Progress Notes (Signed)
Palliative care progress note   Reason for consult: Goals of care  I met today with Mr. Parcel in conjunction with liaison from [facility].  He is at times slow to answer questions, but is oriented and able to make his own decisions.  Discussed with his regarding recommendation for catheter as well as his decision to putt off placement of percutaneous drain.  I shared concern that putting off transition to residential hospice may lead to point where he becomes too unstable to move.   While he was open to listening to my concerns, Mr. Hobby is very non-committal in plan moving forward.  He states that he trusts Lennette Bihari from [employer] as he knows him from prior paracentesis and reports that he is not going to make any further decisions until he talks with him.  He then stated that he was tired, finished talking, and wanted to take a nap.  Will continue to follow-up with Mr. Nakanishi and continue to try to help guide him in decision making as he is willing to engage.  He still reports wanting to go to [facility] but I am concerned that he is going to suffer further complication (such as bladder rupture) if he does not transition soon.  Total time: 50 minutes Greater than 50%  of this time was spent counseling and coordinating care related to the above assessment and plan.  Micheline Rough, MD [place] Team [phone]

## 2017-10-14 NOTE — Progress Notes (Signed)
PT Cancellation Note  Patient Details Name: DRAKO MAESE MRN: [id number] DOB: [birth year]   Cancelled Treatment:    Reason Eval/Treat Not Completed: Fatigue/lethargy limiting ability to participate Pt continues to decline to mobilize with physical therapy despite encouragement and benefits.  Pt reports considering hospice upon d/c.  Recommend nursing assist pt when he is willing to mobilize (use BSC, up to recliner) as pt has not been willing to participate with PT.     Thayer Embleton,[name] 10/14/2017, 11:47 AM Carmelia Bake, PT, DPT Acute Rehabilitation Services Office: [phone] Pager: [phone]

## 2017-10-15 LAB — BASIC METABOLIC PANEL
Anion gap: 7 (ref 5–15)
BUN: 48 mg/dL — AB (ref 8–23)
CHLORIDE: 90 mmol/L — AB (ref 98–111)
CO2: 25 mmol/L (ref 22–32)
Calcium: 8.2 mg/dL — ABNORMAL LOW (ref 8.9–10.3)
Creatinine, Ser: 2.19 mg/dL — ABNORMAL HIGH (ref 0.61–1.24)
GFR calc Af Amer: 35 mL/min — ABNORMAL LOW (ref >60)
GFR calc non Af Amer: 30 mL/min — ABNORMAL LOW (ref >60)
GLUCOSE: 109 mg/dL — AB (ref 70–99)
POTASSIUM: 4.6 mmol/L (ref 3.5–5.1)
Sodium: 122 mmol/L — ABNORMAL LOW (ref 135–145)

## 2017-10-15 NOTE — Progress Notes (Signed)
PROGRESS NOTE    Jerry Mcintyre  GXQ:[id number] DOB: 1956-02-01 DOA: 10/08/2017 PCP: Otis Brace, MD   Brief Narrative: Jerry Mcintyre is a 62 y.o. malewith medical history significant of chronic hepatitis C, decompensated hepatic cirrhosis, hepatocellular carcinoma, CKD 3, cachexia,thrombocytopenia. He presented secondary to abdominal distension and dyspnea.  Patient underwent paracentesis x2 during this hospitalization.  He underwent  palliative care evaluation .Patient is being considered for discharge to residential hospice.  There is plan for placement of peritoneal catheter for drainage of the ascites fluid but patient has not decided yet.  Patient also has developed issues with urinary retention but is denying Foley catheter placement and in and out catheterization.  Assessment & Plan:   Principal Problem:   Ascites Active Problems:   Thrombocytopenia (HCC)   Anasarca   Abdominal distension   Chronic hepatitis C without hepatic coma (HCC)   Protein-calorie malnutrition, severe   Hyperkalemia   Hyponatremia   AKI (acute kidney injury) ([place])   Shortness of breath   Acute urinary retention   Decompensated hepatic cirrhosis Hepatitis C Patient with a history of requiring multiple paracenteses. He is s/p paracentesis on 9/8 yielding 8 L of fluid.  He underwent repeat paracentesis on 10/12/17 with removal of more than 15 L of fluid .Fluid analysis does not appear to support SBP so antibiotics discontinued. Continue lactulose, may need to increase frequency if no bowel movement. Discussion about possible placement of peritoneal catheter placement done for palliative purpose.   IR already evaluated him but since he had not made any decision on Thursday, probably he is being planned for Monday.  Urinary retention: Apparently patient has been passing urine by his own now.He urinary retention yesterday with bladder scan showing about a liter of urine .Patient refused Foley  catheter placement and in and out catheterization.  Extensively  discussed about this issue .  I explained that if he retains urine and he refuses Foley, there is a chance of acute kidney failure and also possible bladder rupture. On tamsulosin.  Hyponatremia Secondary to cirrhosis.Severe. Will discontinue lasix.  Hyperkalemia In setting of acute kidney injury. Stable now  Acute kidney injury on CKD stage III Baseline creatinine of 1.5-1.7.  Kidney function slow to improve  Thrombocytopenia In setting of liver disease. Stable.    Anemia Chronic. Stable.  Leukopenia Transient. Resolved.  Advanced comorbidities/deconditioning/debility: Patient seen by palliative care .  Patient interested on comfort care and residential hospice.  Social worker has been consulted.  Waiting for decision on peritoneal catheter placement on Monday after that he will be transferred to residential hospice.   DVT prophylaxis: SCD Code Status: DNR Family Communication: None present at the bed side Disposition Plan: Likely residential hospice after  peritoneal catheter placement  Consultants: Palliative care  Procedures: Paracentesis x 2  Antimicrobials: None  Subjective: Patient seen and examined the bedside this morning.  No active issues right now.  Apparently he has been passing urine by his own.  He is waiting for peritoneal catheter placement on Monday.  Denies any new problems.  Objective: Vitals:   10/14/17 0501 10/14/17 1411 10/14/17 2040 10/15/17 0702  BP: 101/65 90/64 (!) 89/62 (!) 97/59  Pulse: 65 90 93 89  Resp: 18 17 18 16   Temp: 97.7 F (36.5 C) 97.8 F (36.6 C) (!) 97.5 F (36.4 C) 97.6 F (36.4 C)  TempSrc: Oral Oral Oral   SpO2: 94% 93% 95% 95%  Weight: 71.1 kg     Height:  Intake/Output Summary (Last 24 hours) at 10/15/2017 1324 Last data filed at 10/14/2017 1810 Gross per 24 hour  Intake 0 ml  Output 400 ml  Net -400 ml   Filed Weights   10/09/17 0437  10/10/17 0429 10/14/17 0501  Weight: 93 kg 84.1 kg 71.1 kg    Examination:  General exam: Not in distress,thin/cachetic HEENT:PERRL,Oral mucosa moist, Ear/Nose normal on gross exam Respiratory system: Bilateral equal air entry, normal vesicular breath sounds, no wheezes or crackles  Cardiovascular system: S1 & S2 heard, RRR. No JVD, murmurs, rubs, gallops or clicks. No pedal edema. Gastrointestinal system: Abdomen is distended, slightly tense and nontender. Normal bowel sounds heard. Central nervous system: Alert and oriented. No focal neurological deficits. Extremities: No edema, no clubbing ,no cyanosis, distal peripheral pulses palpable. Skin: Diffuse ecchymosis throughout the body, no lesions or ulcers,no icterus ,no pallor  Data Reviewed: I have personally reviewed following labs and imaging studies  CBC: Recent Labs  Lab 10/08/17 1835 10/09/17 0414 10/10/17 0440 10/11/17 0356 10/12/17 0412  WBC 4.0 3.1* 4.3 4.1 4.7  NEUTROABS 2.5  --   --   --   --   HGB 9.2* 10.1* 10.8* 10.6* 10.7*  HCT 26.3* 28.3* 30.2* 29.7* 29.6*  MCV 94.3 94.0 94.1 94.9 93.7  PLT 93* 107* 91* 93* 80*   Basic Metabolic Panel: Recent Labs  Lab 10/08/17 1835  10/09/17 0414 10/10/17 0440  10/11/17 0356 10/11/17 1543 10/12/17 0412 10/12/17 1537 10/13/17 0407 10/15/17 0429  NA 126*   < > 127* 127*  --  126*  --  127*  --  127* 122*  K 5.8*   < > 5.2* 5.6*   < > 5.7* 4.9 5.8* 4.4 4.4 4.6  CL 96*   < > 96* 96*  --  96*  --  97*  --  94* 90*  CO2 20*   < > 24 25  --  22  --  24  --  26 25  GLUCOSE 116*   < > 99 104*  --  108*  --  112*  --  115* 109*  BUN 40*   < > 43* 40*  --  44*  --  45*  --  45* 48*  CREATININE QUANTITY NOT SUFFICIENT, UNABLE TO PERFORM TEST   < > 2.70* 2.62*  --  2.64*  --  2.56*  --  2.37* 2.19*  CALCIUM 9.0   < > 9.0 9.0  --  8.9  --  8.5*  --  8.1* 8.2*  MG 2.4  --  2.5*  --   --   --   --  2.3  --   --   --   PHOS  --   --  2.9  --   --   --   --   --   --   --   --     < > = values in this interval not displayed.   GFR: Estimated Creatinine Clearance: 34.5 mL/min (A) (by C-G formula based on SCr of 2.19 mg/dL (H)). Liver Function Tests: Recent Labs  Lab 10/08/17 1835 10/09/17 0414 10/10/17 0440  AST 73* 75* 64*  ALT 25 28 23   ALKPHOS 65 61 56  BILITOT 3.4* 2.3* 2.5*  PROT 7.6 7.8 7.2  ALBUMIN 3.2* 3.5 3.0*   No results for input(s): LIPASE, AMYLASE in the last 168 hours. Recent Labs  Lab 10/10/17 0550  AMMONIA 58*   Coagulation Profile: Recent Labs  Lab  10/08/17 1938 10/09/17 0414  INR 1.29 1.34   Cardiac Enzymes: No results for input(s): CKTOTAL, CKMB, CKMBINDEX, TROPONINI in the last 168 hours. BNP (last 3 results) No results for input(s): PROBNP in the last 8760 hours. HbA1C: No results for input(s): HGBA1C in the last 72 hours. CBG: No results for input(s): GLUCAP in the last 168 hours. Lipid Profile: No results for input(s): CHOL, HDL, LDLCALC, TRIG, CHOLHDL, LDLDIRECT in the last 72 hours. Thyroid Function Tests: No results for input(s): TSH, T4TOTAL, FREET4, T3FREE, THYROIDAB in the last 72 hours. Anemia Panel: No results for input(s): VITAMINB12, FOLATE, FERRITIN, TIBC, IRON, RETICCTPCT in the last 72 hours. Sepsis Labs: Recent Labs  Lab 10/08/17 1849  LATICACIDVEN 1.72    Recent Results (from the past 240 hour(s))  Culture, body fluid-bottle     Status: None   Collection Time: 10/09/17  8:19 AM  Result Value Ref Range Status   Specimen Description PERITONEAL  Final   Special Requests NONE  Final   Culture   Final    NO GROWTH 5 DAYS Performed at [facility], [address], [place], [place] [address]    Report Status 10/14/2017 FINAL  Final  Gram stain     Status: None   Collection Time: 10/09/17  8:19 AM  Result Value Ref Range Status   Specimen Description PERITONEAL  Final   Special Requests NONE  Final   Gram Stain   Final    RARE WBC PRESENT, PREDOMINANTLY MONONUCLEAR NO ORGANISMS  SEEN Performed at [facility], [place] [address] [place], [place] [address]    Report Status 10/09/2017 FINAL  Final         Radiology Studies: No results found.      Scheduled Meds: . feeding supplement (PRO-STAT SUGAR FREE 64)  30 mL Oral BID  . lactose free nutrition  237 mL Oral TID WC  . lactulose  20 g Oral Daily  . sodium chloride flush  3 mL Intravenous Q12H  . tamsulosin  0.4 mg Oral Daily  . thiamine  100 mg Oral Daily   Continuous Infusions: . sodium chloride Stopped (10/09/17 2333)     LOS: 7 days    Time spent: 25 mins.More than 50% of that time was spent in counseling and/or coordination of care.      Shelly Coss, MD [facility] Pager [phone]  If 7PM-7AM, please contact night-coverage [email or web address] Password [email or web address] 10/15/2017, 1:24 PM

## 2017-10-15 NOTE — Progress Notes (Signed)
PT Cancellation Note  Patient Details Name: Jerry Mcintyre MRN: [id number] DOB: [birth year]   Cancelled Treatment:    Reason Eval/Treat Not Completed: Other (comment) Attempted to work with patient however he was just receiving/starting to eat lunch and was unavailable. Plan to check back with patient on next date of service.    Deniece Ree PT, DPT, CBIS  Supplemental Physical Therapist [facility]    Pager [phone] Acute Rehab Office [phone]

## 2017-10-16 LAB — BASIC METABOLIC PANEL
Anion gap: 7 (ref 5–15)
BUN: 54 mg/dL — ABNORMAL HIGH (ref 8–23)
CHLORIDE: 89 mmol/L — AB (ref 98–111)
CO2: 27 mmol/L (ref 22–32)
Calcium: 8.6 mg/dL — ABNORMAL LOW (ref 8.9–10.3)
Creatinine, Ser: 2.24 mg/dL — ABNORMAL HIGH (ref 0.61–1.24)
GFR calc non Af Amer: 29 mL/min — ABNORMAL LOW (ref >60)
GFR, EST AFRICAN AMERICAN: 34 mL/min — AB (ref >60)
Glucose, Bld: 119 mg/dL — ABNORMAL HIGH (ref 70–99)
POTASSIUM: 5.5 mmol/L — AB (ref 3.5–5.1)
SODIUM: 123 mmol/L — AB (ref 135–145)

## 2017-10-16 MED ORDER — SODIUM POLYSTYRENE SULFONATE 15 GM/60ML PO SUSP
30.0000 g | Freq: Once | ORAL | Status: AC
  Administered 2017-10-16: 30 g via ORAL
  Filled 2017-10-16: qty 120

## 2017-10-16 NOTE — Progress Notes (Signed)
PROGRESS NOTE    Jerry Mcintyre  [name]:[id number] DOB: 1955-12-01 DOA: 10/08/2017 PCP: Otis Brace, MD   Brief Narrative: Jerry Mcintyre is a 62 y.o. malewith medical history significant of chronic hepatitis C, decompensated hepatic cirrhosis, hepatocellular carcinoma, CKD 3, cachexia,thrombocytopenia. He presented secondary to abdominal distension and dyspnea.  Patient underwent paracentesis x2 during this hospitalization.  He underwent  palliative care evaluation .Patient is being considered for discharge to residential hospice.  There is plan for placement of peritoneal catheter for drainage of the ascites fluid but patient has not decided yet.  Patient also has developed issues with urinary retention but is denying Foley catheter placement and in and out catheterization.Plan is to peritoneal drainage catheter by IR tomorrow.  Assessment & Plan:   Principal Problem:   Ascites Active Problems:   Thrombocytopenia (HCC)   Anasarca   Abdominal distension   Chronic hepatitis C without hepatic coma (HCC)   Protein-calorie malnutrition, severe   Hyperkalemia   Hyponatremia   AKI (acute kidney injury) ([place])   Shortness of breath   Acute urinary retention   Decompensated hepatic cirrhosis Hepatitis C Patient with a history of requiring multiple paracenteses. He is s/p paracentesis on 9/8 yielding 8 L of fluid.  He underwent repeat paracentesis on 10/12/17 with removal of more than 15 L of fluid .Fluid analysis does not appear to support SBP so antibiotics discontinued. Continue lactulose, may need to increase frequency if no bowel movement. Discussion about possible placement of peritoneal catheter placement done for palliative purpose.   IR already evaluated him but since he had not made any decision on Thursday,he is being planned for Monday.NPO after midnight.  Urinary retention: Apparently patient has been passing urine by his own now.He urinary retention yesterday with  bladder scan showing about a liter of urine .Patient refused Foley catheter placement and in and out catheterization.  Extensively  discussed about this issue .  I explained that if he retains urine and he refuses Foley, there is a chance of acute kidney failure and also possible bladder rupture. On tamsulosin.  Hyponatremia Secondary to cirrhosis.Severe.lasix D/Ced.  Hyperkalemia In setting of acute kidney injury. Will give him a dose of Kayexalate today.  Will check potassium level tomorrow morning.  Acute kidney injury on CKD stage III Baseline creatinine of 1.5-1.7.  Kidney function slow to improve  Thrombocytopenia In setting of liver disease. Stable.    Anemia Chronic. Stable.  Leukopenia Transient. Resolved.  Advanced comorbidities/deconditioning/debility: Patient seen by palliative care .  Patient interested on comfort care and residential hospice.  Social worker has been consulted.  Waiting for peritoneal catheter placement on Monday after that he will be transferred to residential hospice.  Patient is also considering hospice at home.   DVT prophylaxis: SCD Code Status: DNR Family Communication: None present at the bed side Disposition Plan: Likely residential hospice after  peritoneal catheter placement vs Hospice at home  Consultants: Palliative care  Procedures: Paracentesis x 2  Antimicrobials: None  Subjective: Patient seen and examined the bedside this morning.  No active issues right now.  Apparently he has been passing urine by his own.  He is waiting for peritoneal catheter placement on Monday.  Denies any new problems.Actually feels better today.  Objective: Vitals:   10/15/17 0702 10/15/17 1336 10/15/17 2122 10/16/17 0618  BP: (!) 97/59 105/67 93/67 91/63   Pulse: 89 60 79 70  Resp: 16  16 16   Temp: 97.6 F (36.4 C) 97.6 F (36.4 C) 97.8  F (36.6 C) 97.7 F (36.5 C)  TempSrc:  Oral Oral Oral  SpO2: 95% 99% 95% 98%  Weight:      Height:         Intake/Output Summary (Last 24 hours) at 10/16/2017 1123 Last data filed at 10/16/2017 0957 Gross per 24 hour  Intake 560 ml  Output 500 ml  Net 60 ml   Filed Weights   10/09/17 0437 10/10/17 0429 10/14/17 0501  Weight: 93 kg 84.1 kg 71.1 kg    Examination:  General exam: Not in distress,thin/cachetic HEENT:PERRL,Oral mucosa moist, Ear/Nose normal on gross exam Respiratory system: Bilateral equal air entry, normal vesicular breath sounds, no wheezes or crackles  Cardiovascular system: S1 & S2 heard, RRR. No JVD, murmurs, rubs, gallops or clicks. No pedal edema. Gastrointestinal system: Abdomen is distended, slightly tense and nontender. Normal bowel sounds heard. Central nervous system: Alert and oriented. No focal neurological deficits. Extremities: No edema, no clubbing ,no cyanosis, distal peripheral pulses palpable. Skin: Diffuse ecchymosis throughout the body, no lesions or ulcers,no icterus ,no pallor  Data Reviewed: I have personally reviewed following labs and imaging studies  CBC: Recent Labs  Lab 10/10/17 0440 10/11/17 0356 10/12/17 0412  WBC 4.3 4.1 4.7  HGB 10.8* 10.6* 10.7*  HCT 30.2* 29.7* 29.6*  MCV 94.1 94.9 93.7  PLT 91* 93* 80*   Basic Metabolic Panel: Recent Labs  Lab 10/11/17 0356  10/12/17 0412 10/12/17 1537 10/13/17 0407 10/15/17 0429 10/16/17 0432  NA 126*  --  127*  --  127* 122* 123*  K 5.7*   < > 5.8* 4.4 4.4 4.6 5.5*  CL 96*  --  97*  --  94* 90* 89*  CO2 22  --  24  --  26 25 27   GLUCOSE 108*  --  112*  --  115* 109* 119*  BUN 44*  --  45*  --  45* 48* 54*  CREATININE 2.64*  --  2.56*  --  2.37* 2.19* 2.24*  CALCIUM 8.9  --  8.5*  --  8.1* 8.2* 8.6*  MG  --   --  2.3  --   --   --   --    < > = values in this interval not displayed.   GFR: Estimated Creatinine Clearance: 33.8 mL/min (A) (by C-G formula based on SCr of 2.24 mg/dL (H)). Liver Function Tests: Recent Labs  Lab 10/10/17 0440  AST 64*  ALT 23  ALKPHOS 56   BILITOT 2.5*  PROT 7.2  ALBUMIN 3.0*   No results for input(s): LIPASE, AMYLASE in the last 168 hours. Recent Labs  Lab 10/10/17 0550  AMMONIA 58*   Coagulation Profile: No results for input(s): INR, PROTIME in the last 168 hours. Cardiac Enzymes: No results for input(s): CKTOTAL, CKMB, CKMBINDEX, TROPONINI in the last 168 hours. BNP (last 3 results) No results for input(s): PROBNP in the last 8760 hours. HbA1C: No results for input(s): HGBA1C in the last 72 hours. CBG: No results for input(s): GLUCAP in the last 168 hours. Lipid Profile: No results for input(s): CHOL, HDL, LDLCALC, TRIG, CHOLHDL, LDLDIRECT in the last 72 hours. Thyroid Function Tests: No results for input(s): TSH, T4TOTAL, FREET4, T3FREE, THYROIDAB in the last 72 hours. Anemia Panel: No results for input(s): VITAMINB12, FOLATE, FERRITIN, TIBC, IRON, RETICCTPCT in the last 72 hours. Sepsis Labs: No results for input(s): PROCALCITON, LATICACIDVEN in the last 168 hours.  Recent Results (from the past 240 hour(s))  Culture, body fluid-bottle  Status: None   Collection Time: 10/09/17  8:19 AM  Result Value Ref Range Status   Specimen Description PERITONEAL  Final   Special Requests NONE  Final   Culture   Final    NO GROWTH 5 DAYS Performed at [facility], [address], [place], [place] [address]    Report Status 10/14/2017 FINAL  Final  Gram stain     Status: None   Collection Time: 10/09/17  8:19 AM  Result Value Ref Range Status   Specimen Description PERITONEAL  Final   Special Requests NONE  Final   Gram Stain   Final    RARE WBC PRESENT, PREDOMINANTLY MONONUCLEAR NO ORGANISMS SEEN Performed at [facility], [place] [address], [place], [place] [address]    Report Status 10/09/2017 FINAL  Final         Radiology Studies: No results found.      Scheduled Meds: . feeding supplement (PRO-STAT SUGAR FREE 64)  30 mL Oral BID  . lactose free nutrition  237 mL Oral TID WC   . lactulose  20 g Oral Daily  . sodium chloride flush  3 mL Intravenous Q12H  . tamsulosin  0.4 mg Oral Daily  . thiamine  100 mg Oral Daily   Continuous Infusions: . sodium chloride Stopped (10/09/17 2333)     LOS: 8 days    Time spent: 25 mins.More than 50% of that time was spent in counseling and/or coordination of care.      Shelly Coss, MD [facility] Pager [phone]  If 7PM-7AM, please contact night-coverage [email or web address] Password [facility] 10/16/2017, 11:23 AM

## 2017-10-16 NOTE — Consult Note (Signed)
Chief Complaint: Patient was seen in consultation today for decompensated hepatic cirrhosis with recurrent ascites.  Referring Physician(s): Shelly Coss  Supervising Physician: Sandi Mariscal  Patient Status: [facility] - In-pt  History of Present Illness: Jerry Mcintyre is a 62 y.o. male with a past medical history of hypertension, cirrhosis with recurrent ascites, abdominal hernia, AKI, CKD stage III, thrombocytopenia, hepatocellular carcinoma, hepatitis C, and protein-calorie malnutrition. He presented to ED 10/08/2017 with complaints of dyspnea and ascites. He was found to have decompensated hepatic cirrhosis and was admitted for management. He is known to IR and has had multiple paracentesis with [place]. Recently, during this hospital visit, he underwent paracentesis 10/09/2017 yielding 8 L and again on 10/11/2017 yielding 15.5 L.  IR requested by Dr. Tawanna Solo for possible image-guided tunneled peritoneal catheter insertion. Patient awake and alert laying in bed. States that overall he is very tired, which is normal for him. States after discussion with family, he has decided to move forward with procedure at this time. Denies fever, chills, chest pain, dyspnea, abdominal pain, dizziness, or headache.   Past Medical History:  Diagnosis Date  . Hernia, abdominal   . Hypertension   . 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 DENTAL SURGERY    . teeth removal      Allergies: Bee venom  Medications: Prior to Admission medications   Medication Sig Start Date End Date Taking? Authorizing Provider  furosemide (LASIX) 20 MG tablet Take 1 tablet (20 mg total) by mouth daily. 08/17/17  Yes Irene Pap N, DO  lactulose (CHRONULAC) 10 GM/15ML solution Take 30 mLs (20 g total) by mouth daily. 08/17/17  Yes Kayleen Memos, DO  spironolactone (ALDACTONE) 25 MG tablet Take 1 tablet (25 mg total) by mouth daily. 08/17/17  Yes Kayleen Memos, DO  lactose free  nutrition (BOOST) LIQD Take 237 mLs by mouth 3 (three) times daily between meals. Patient not taking: Reported on 10/08/2017 08/16/17   Kayleen Memos, DO  pantoprazole (PROTONIX) 40 MG tablet Take 1 tablet (40 mg total) by mouth daily. Patient not taking: Reported on 10/08/2017 08/16/17   Kayleen Memos, DO     Family History  Problem Relation Age of Onset  . Stroke Mother   . Hypertension Mother     Social History   Socioeconomic History  . Marital status: Single    Spouse name: Not on file  . Number of children: Not on file  . Years of education: Not on file  . Highest education level: Not on file  Occupational History  . Not on file  Social Needs  . Financial resource strain: Not on file  . Food insecurity:    Worry: Not on file    Inability: Not on file  . Transportation needs:    Medical: Not on file    Non-medical: Not on file  Tobacco Use  . Smoking status: Former Smoker    Last attempt to quit: 11/01/2016    Years since quitting: 0.9  . Smokeless tobacco: Never Used  Substance and Sexual Activity  . Alcohol use: Yes    Comment: "drink a beer in the summertime"  . Drug use: No  . Sexual activity: Not on file  Lifestyle  . Physical activity:    Days per week: Not on file    Minutes per session: Not on file  . Stress: Not on file  Relationships  . Social connections:  Talks on phone: Not on file    Gets together: Not on file    Attends religious service: Not on file    Active member of club or organization: Not on file    Attends meetings of clubs or organizations: Not on file    Relationship status: Not on file  Other Topics Concern  . Not on file  Social History Narrative  . Not on file     Review of Systems: A 12 point ROS discussed and pertinent positives are indicated in the HPI above.  All other systems are negative.  Review of Systems  Constitutional: Positive for fatigue. Negative for chills and fever.  Respiratory: Negative for shortness of  breath and wheezing.   Cardiovascular: Negative for chest pain and palpitations.  Gastrointestinal: Negative for abdominal pain.  Neurological: Negative for dizziness and headaches.  Psychiatric/Behavioral: Negative for behavioral problems and confusion.    Vital Signs: BP (!) 81/51 (BP Location: Left Arm)   Pulse 63   Temp 97.7 F (36.5 C) (Oral)   Resp 16   Ht 5\' 9"  (1.753 m)   Wt 156 lb 11.2 oz (71.1 kg)   SpO2 98%   BMI 23.14 kg/m   Physical Exam  Constitutional: He is oriented to person, place, and time. He appears well-developed and well-nourished. No distress.  Cardiovascular: Normal rate, regular rhythm and normal heart sounds.  No murmur heard. Pulmonary/Chest: Effort normal and breath sounds normal. No respiratory distress. He has no wheezes.  Abdominal: Soft. He exhibits distension. There is no tenderness.  Neurological: He is alert and oriented to person, place, and time.  Skin: Skin is warm and dry.  Psychiatric: He has a normal mood and affect. His behavior is normal. Judgment and thought content normal.  Nursing note and vitals reviewed.    MD Evaluation Airway: WNL Heart: WNL Abdomen: WNL Chest/ Lungs: WNL ASA  Classification: 4 Mallampati/Airway Score: One   Imaging: Dg Chest 2 View  Result Date: 10/08/2017 CLINICAL DATA:  Dyspnea, ascites EXAM: CHEST - 2 VIEW COMPARISON:  08/09/2017 chest radiograph. FINDINGS: Very low lung volumes. Stable cardiomediastinal silhouette with normal heart size. No pneumothorax. No pleural effusion. Moderate right basilar scarring versus atelectasis. No pulmonary edema. No acute consolidative airspace disease. IMPRESSION: Very low lung volumes. Moderate right basilar scarring versus atelectasis. Electronically Signed   By: Ilona Sorrel M.D.   On: 10/08/2017 20:00   US Paracentesis  Result Date: 10/11/2017 Joaquim Nam, PA-C     10/11/2017  1:19 PM PROCEDURE SUMMARY: Successful image-guided paracentesis from the  right lower lateral abdomen. Yielded 15.5 liters of golden yellow fluid. No immediate complications - procedure was stopped at 15.5 liters due to concern for developing hypotension in setting of poor renal function without albumin administration per ordering provider. Residual fluid remains in abdomen post-procedure, patient reports improvement in symptoms. He denies any complaints. Patient tolerated well. Specimen was not sent for labs. Joaquim Nam PA-C 10/11/2017 12:21 PM   US Paracentesis  Result Date: 10/09/2017 INDICATION: Cirrhosis, hepatitis-C, hepatocellular carcinoma, acute renal insufficiency, recurrent ascites. Request for diagnostic and therapeutic paracentesis up to 8 liters. EXAM: ULTRASOUND GUIDED PARACENTESIS MEDICATIONS: 1% lidocaine 10 mL COMPLICATIONS: None immediate. PROCEDURE: Informed written consent was obtained from the patient after a discussion of the risks, benefits and alternatives to treatment. A timeout was performed prior to the initiation of the procedure. Initial ultrasound scanning demonstrates a large amount of ascites within the right lower abdominal quadrant. The right lower abdomen  was prepped and draped in the usual sterile fashion. 1% lidocaine with epinephrine was used for local anesthesia. Following this, a 19 gauge, 7-cm, Yueh catheter was introduced. An ultrasound image was saved for documentation purposes. The paracentesis was performed. The catheter was removed and a dressing was applied. The patient tolerated the procedure well without immediate post procedural complication. FINDINGS: A total of approximately 8 L of clear yellow fluid was removed. Samples were sent to the laboratory as requested by the clinical team. IMPRESSION: Successful ultrasound-guided paracentesis yielding 8 liters of peritoneal fluid. Read by: Gareth Eagle, PA-C Electronically Signed   By: Corrie Mckusick D.O.   On: 10/09/2017 08:59    Labs:  CBC: Recent Labs    10/09/17 0414  10/10/17 0440 10/11/17 0356 10/12/17 0412  WBC 3.1* 4.3 4.1 4.7  HGB 10.1* 10.8* 10.6* 10.7*  HCT 28.3* 30.2* 29.7* 29.6*  PLT 107* 91* 93* 80*    COAGS: Recent Labs    01/05/17 0032  04/02/17 0459 08/09/17 1836 10/08/17 1938 10/09/17 0414  INR  --    < > 1.85 1.71 1.29 1.34  APTT 28  --   --   --   --   --    < > = values in this interval not displayed.    BMP: Recent Labs    10/12/17 0412 10/12/17 1537 10/13/17 0407 10/15/17 0429 10/16/17 0432  NA 127*  --  127* 122* 123*  K 5.8* 4.4 4.4 4.6 5.5*  CL 97*  --  94* 90* 89*  CO2 24  --  26 25 27   GLUCOSE 112*  --  115* 109* 119*  BUN 45*  --  45* 48* 54*  CALCIUM 8.5*  --  8.1* 8.2* 8.6*  CREATININE 2.56*  --  2.37* 2.19* 2.24*  GFRNONAA 25*  --  28* 30* 29*  GFRAA 29*  --  32* 35* 34*    LIVER FUNCTION TESTS: Recent Labs    08/14/17 0533 10/08/17 1835 10/09/17 0414 10/10/17 0440  BILITOT 3.1* 3.4* 2.3* 2.5*  AST 34 73* 75* 64*  ALT 12 25 28 23   ALKPHOS 24* 65 61 56  PROT 6.1* 7.6 7.8 7.2  ALBUMIN 4.3 3.2* 3.5 3.0*    TUMOR MARKERS: No results for input(s): AFPTM, CEA, CA199, CHROMGRNA in the last 8760 hours.  Assessment and Plan:  Decompensated hepatic cirrhosis with recurrent ascites. Plan for image-guided tunneled peritoneal catheter insertion tentatively for tomorrow 9/16 with Dr. Kathlene Cote. Patient will be NPO at midnight tonight. Denies fever and WBCs WNL. He does not take blood thinners. INR 1.34 seconds on 10/09/2017.  Risks and benefits discussed with the patient including bleeding, infection, damage to adjacent structures, malfunction of the catheter with need for additional procedures. All of the patient's questions were answered, patient is agreeable to proceed. Consent signed and in chart.   Thank you for this interesting consult.  I greatly enjoyed meeting Jerry Mcintyre and look forward to participating in their care.  A copy of this report was sent to the requesting provider on  this date.  Electronically Signed: Earley Abide, PA-C 10/16/2017, 2:25 PM   I spent a total of 20 Minutes in face to face in clinical consultation, greater than 50% of which was counseling/coordinating care for decompensated hepatic cirrhosis with recurrent ascites.

## 2017-10-16 NOTE — Plan of Care (Signed)
  Problem: Safety: Goal: Ability to remain free from injury will improve Outcome: Progressing   

## 2017-10-16 NOTE — Progress Notes (Signed)
PT Cancellation Note / Sign off  Patient Details Name: Jerry Mcintyre MRN: [id number] DOB: [birth year]   Cancelled Treatment:    Reason Eval/Treat Not Completed: Other (comment)  Pt has been declining to participate multiple times so will sign off again for PT.  Would recommend pt try to mobilize with nursing as he wishes (since he is not agreeable when therapy does come to check on him) prior to reordering PT consult.  If pt requiring more assist or would benefit from PT then please reorder IF pt is willing to participate.  Also palliative care team following and possible d/c with hospice pending.   Merton Wadlow,[name] 10/16/2017, 1:26 PM Carmelia Bake, PT, DPT Acute Rehabilitation Services Office: [phone] Pager: [phone]

## 2017-10-17 ENCOUNTER — Encounter (HOSPITAL_COMMUNITY): Payer: Self-pay | Admitting: Interventional Radiology

## 2017-10-17 ENCOUNTER — Inpatient Hospital Stay (HOSPITAL_COMMUNITY): Payer: Medicare Other

## 2017-10-17 HISTORY — PX: IR GUIDED DRAIN W CATHETER PLACEMENT: IMG719

## 2017-10-17 LAB — BASIC METABOLIC PANEL
ANION GAP: 7 (ref 5–15)
BUN: 55 mg/dL — AB (ref 8–23)
CALCIUM: 8.4 mg/dL — AB (ref 8.9–10.3)
CO2: 27 mmol/L (ref 22–32)
CREATININE: 1.99 mg/dL — AB (ref 0.61–1.24)
Chloride: 88 mmol/L — ABNORMAL LOW (ref 98–111)
GFR calc Af Amer: 39 mL/min — ABNORMAL LOW (ref >60)
GFR calc non Af Amer: 34 mL/min — ABNORMAL LOW (ref >60)
GLUCOSE: 93 mg/dL (ref 70–99)
Potassium: 4.2 mmol/L (ref 3.5–5.1)
Sodium: 122 mmol/L — ABNORMAL LOW (ref 135–145)

## 2017-10-17 MED ORDER — MIDAZOLAM HCL 2 MG/2ML IJ SOLN
INTRAMUSCULAR | Status: AC | PRN
  Administered 2017-10-17 (×2): 1 mg via INTRAVENOUS

## 2017-10-17 MED ORDER — FENTANYL CITRATE (PF) 100 MCG/2ML IJ SOLN
INTRAMUSCULAR | Status: AC | PRN
  Administered 2017-10-17 (×2): 50 ug via INTRAVENOUS

## 2017-10-17 MED ORDER — CEFAZOLIN SODIUM-DEXTROSE 2-4 GM/100ML-% IV SOLN
INTRAVENOUS | Status: AC
  Administered 2017-10-17: 2000 mg
  Filled 2017-10-17: qty 100

## 2017-10-17 MED ORDER — MIDAZOLAM HCL 2 MG/2ML IJ SOLN
INTRAMUSCULAR | Status: AC
  Filled 2017-10-17: qty 4

## 2017-10-17 MED ORDER — CEFAZOLIN (ANCEF) 1 G IV SOLR
2.0000 g | INTRAVENOUS | Status: AC
  Filled 2017-10-17: qty 2

## 2017-10-17 MED ORDER — FENTANYL CITRATE (PF) 100 MCG/2ML IJ SOLN
INTRAMUSCULAR | Status: AC
  Filled 2017-10-17: qty 2

## 2017-10-17 MED ORDER — LIDOCAINE HCL 1 % IJ SOLN
INTRAMUSCULAR | Status: AC
  Filled 2017-10-17: qty 20

## 2017-10-17 NOTE — Progress Notes (Signed)
Palliative:  I met today with [name] and his sister, [name], is exiting as I come to visit. They do both confirm the goal to transition to [facility] for EOL care and she talks of visiting him at hospice. His peritoneal drain was placed this morning but he still refuses foley catheter to decompress bladder although this may improve with decreased pressure from ascites with drain in place.   [name] is eating a little bit of food that his sister brought him today. He is drinking water. He tells me that this is all that he has eaten all day and continues to say he has only had a couple bites here and there over the weekend. He is coming to terms with EOL and confirms that he is still happy with his decision to transition and focus on comfort care and is now prepared to transition to [facility] now that he has his drained and has discussed with his family. He says he is at peace with this decision.   I got him more ice and helped him settle in for his nap this afternoon. He was sleeping most of the day (I believe this is normal for him as he requests that I come in the afternoons). Therapeutic listening and emotional support provided.   98 min  Vinie Sill, NP Palliative Medicine Team Pager # [phone] (M-F 8a-5p) Team Phone # [phone] (Nights/Weekends)

## 2017-10-17 NOTE — Care Management Important Message (Signed)
Important Message  Patient Details  Name: Jerry Mcintyre MRN: [id number] Date of Birth: 05-03-1955   Medicare Important Message Given:  Yes    Kerin Salen 10/17/2017, 11:37 AMImportant Message  Patient Details  Name: Jerry Mcintyre MRN: [id number] Date of Birth: 31-Jan-1956   Medicare Important Message Given:  Yes    Kerin Salen 10/17/2017, 11:36 AM

## 2017-10-17 NOTE — Progress Notes (Signed)
PROGRESS NOTE    Jerry Mcintyre  WUJ:[id number] DOB: [birth year] DOA: 10/08/2017 PCP: Otis Brace, MD   Brief Narrative: Jerry Mcintyre is a 62 y.o. malewith medical history significant of chronic hepatitis C, decompensated hepatic cirrhosis, hepatocellular carcinoma, CKD 3, cachexia,thrombocytopenia. He presented secondary to abdominal distension and dyspnea.  Patient underwent paracentesis x2 during this hospitalization.  He underwent  palliative care evaluation .Patient is being considered for discharge to residential hospice.  There is plan for placement of peritoneal catheter for drainage of the ascites fluid but patient has not decided yet.  Patient also has developed issues with urinary retention but is denying Foley catheter placement and in and out catheterization.Underwent peritoneal drainage catheter by IR today.Planning for DC to residential hospice versus home with hospice after palliative care evaluation .  Assessment & Plan:   Principal Problem:   Ascites Active Problems:   Thrombocytopenia (HCC)   Anasarca   Abdominal distension   Chronic hepatitis C without hepatic coma (HCC)   Protein-calorie malnutrition, severe   Hyperkalemia   Hyponatremia   AKI (acute kidney injury) ([facility])   Shortness of breath   Acute urinary retention   Decompensated hepatic cirrhosis Hepatitis C Patient with a history of requiring multiple paracenteses. He is s/p paracentesis on 9/8 yielding 8 L of fluid.  He underwent repeat paracentesis on 10/12/17 with removal of more than 15 L of fluid .Fluid analysis does not appear to support SBP so antibiotics discontinued. Continue lactulose, may need to increase frequency if no bowel movement. Underwent peritoneal catheter placement for palliative reason.  Also underwent large-volume paracentesis.  Urinary retention: Apparently patient has been passing urine by his own now.Patient refused Foley catheter placement and in and out  catheterization.  Extensively  discussed about this issue .  I explained that if he retains urine and he refuses Foley, there is a chance of acute kidney failure and also possible bladder rupture. On tamsulosin.  Hyponatremia Secondary to cirrhosis.Severe.lasix D/Ced.  Hyperkalemia Improved today:  Acute kidney injury on CKD stage III Baseline creatinine of 1.5-1.7.  Kidney function slow to improve  Thrombocytopenia In setting of liver disease. Stable.    Anemia Chronic. Stable.  Leukopenia Transient. Resolved.  Advanced comorbidities/deconditioning/debility: Patient seen by palliative care .  Patient interested on comfort care and residential hospice.  Social worker has been consulted.    Patient is also considering hospice at home.Waiting for palliative care evaluation today and final decision on discharge.   DVT prophylaxis: SCD Code Status: DNR Family Communication: None present at the bed side Disposition Plan: Likely residential hospice  vs Hospice at home  Consultants: Palliative care  Procedures: Paracentesis x 3  Antimicrobials: None  Subjective: Patient seen and examined the bedside this morning.  Underwent peritoneal catheter placement today.  Feels comfortable.  No new issues.  Objective: Vitals:   10/17/17 0945 10/17/17 0950 10/17/17 0955 10/17/17 1000  BP: 101/68 95/69 113/79 110/74  Pulse:   (!) 51 (!) 59  Resp: 14 (!) 7 15 15   Temp:      TempSrc:      SpO2:   94% 97%  Weight:      Height:        Intake/Output Summary (Last 24 hours) at 10/17/2017 1423 Last data filed at 10/17/2017 1000 Gross per 24 hour  Intake 220 ml  Output -  Net 220 ml   Filed Weights   10/09/17 0437 10/10/17 0429 10/14/17 0501  Weight: 93 kg 84.1 kg 71.1 kg  Examination:  General exam: Not in distress,thin/cachetic HEENT:PERRL,Oral mucosa moist, Ear/Nose normal on gross exam Respiratory system: Bilateral equal air entry, normal vesicular breath sounds, no  wheezes or crackles  Cardiovascular system: S1 & S2 heard, RRR. No JVD, murmurs, rubs, gallops or clicks. No pedal edema. Gastrointestinal system: Abdomen is soft and nontender after paracentesis today. Normal bowel sounds heard.  Peritoneal catheter on the right side. Central nervous system: Alert and oriented. No focal neurological deficits. Extremities: No edema, no clubbing ,no cyanosis, distal peripheral pulses palpable. Skin: Diffuse ecchymosis throughout the body, no lesions or ulcers,no icterus ,no pallor  Data Reviewed: I have personally reviewed following labs and imaging studies  CBC: Recent Labs  Lab 10/11/17 0356 10/12/17 0412  WBC 4.1 4.7  HGB 10.6* 10.7*  HCT 29.7* 29.6*  MCV 94.9 93.7  PLT 93* 80*   Basic Metabolic Panel: Recent Labs  Lab 10/12/17 0412 10/12/17 1537 10/13/17 0407 10/15/17 0429 10/16/17 0432 10/17/17 0842  NA 127*  --  127* 122* 123* 122*  K 5.8* 4.4 4.4 4.6 5.5* 4.2  CL 97*  --  94* 90* 89* 88*  CO2 24  --  26 25 27 27   GLUCOSE 112*  --  115* 109* 119* 93  BUN 45*  --  45* 48* 54* 55*  CREATININE 2.56*  --  2.37* 2.19* 2.24* 1.99*  CALCIUM 8.5*  --  8.1* 8.2* 8.6* 8.4*  MG 2.3  --   --   --   --   --    GFR: Estimated Creatinine Clearance: 38 mL/min (A) (by C-G formula based on SCr of 1.99 mg/dL (H)). Liver Function Tests: No results for input(s): AST, ALT, ALKPHOS, BILITOT, PROT, ALBUMIN in the last 168 hours. No results for input(s): LIPASE, AMYLASE in the last 168 hours. No results for input(s): AMMONIA in the last 168 hours. Coagulation Profile: No results for input(s): INR, PROTIME in the last 168 hours. Cardiac Enzymes: No results for input(s): CKTOTAL, CKMB, CKMBINDEX, TROPONINI in the last 168 hours. BNP (last 3 results) No results for input(s): PROBNP in the last 8760 hours. HbA1C: No results for input(s): HGBA1C in the last 72 hours. CBG: No results for input(s): GLUCAP in the last 168 hours. Lipid Profile: No results  for input(s): CHOL, HDL, LDLCALC, TRIG, CHOLHDL, LDLDIRECT in the last 72 hours. Thyroid Function Tests: No results for input(s): TSH, T4TOTAL, FREET4, T3FREE, THYROIDAB in the last 72 hours. Anemia Panel: No results for input(s): VITAMINB12, FOLATE, FERRITIN, TIBC, IRON, RETICCTPCT in the last 72 hours. Sepsis Labs: No results for input(s): PROCALCITON, LATICACIDVEN in the last 168 hours.  Recent Results (from the past 240 hour(s))  Culture, body fluid-bottle     Status: None   Collection Time: 10/09/17  8:19 AM  Result Value Ref Range Status   Specimen Description PERITONEAL  Final   Special Requests NONE  Final   Culture   Final    NO GROWTH 5 DAYS Performed at [facility], [address] [place], [place] [address]    Report Status 10/14/2017 FINAL  Final  Gram stain     Status: None   Collection Time: 10/09/17  8:19 AM  Result Value Ref Range Status   Specimen Description PERITONEAL  Final   Special Requests NONE  Final   Gram Stain   Final    RARE WBC PRESENT, PREDOMINANTLY MONONUCLEAR NO ORGANISMS SEEN Performed at [facility], [place] [address] [place], [place] [address]  Report Status 10/09/2017 FINAL  Final         Radiology Studies: Ir Lenise Arena W Catheter Placement  Result Date: 10/17/2017 CLINICAL DATA:  End-stage liver disease with cirrhosis, chronic hepatitis-C infection and history of hepatocellular carcinoma. Request for tunneled peritoneal drainage catheter placement for palliative drainage purposes. EXAM: INSERTION OF TUNNELED PERITONEAL DRAINAGE CATHETER ANESTHESIA/SEDATION: 2.0 mg IV Versed; 100 mcg IV Fentanyl. Total Moderate Sedation Time 18 minutes. The patient's level of consciousness and physiologic status were continuously monitored during the procedure by Radiology nursing. MEDICATIONS: 2 g IV Ancef. Antibiotic was administered in an appropriate time interval for the procedure. FLUOROSCOPY TIME:  Less than 6 seconds. PROCEDURE: The  procedure, risks, benefits, and alternatives were explained to the patient. Questions regarding the procedure were encouraged and answered. The patient understands and consents to the procedure. A time-out was performed prior to initiating the procedure. The right abdominal wall was prepped with Betadine in a sterile fashion, and a sterile drape was applied covering the operative field. A sterile gown and sterile gloves were used for the procedure. Local anesthesia was provided with 1% Lidocaine. Ultrasound image documentation was performed. Fluoroscopy during the procedure and fluoroscopic spot radiograph confirms appropriate catheter position. After creating a small skin incision, a 19 gauge needle was advanced into the peritoneal cavity under ultrasound guidance. A guide wire was then advanced under fluoroscopy into the peritoneal cavity. Peritoneal access was dilated serially and a 16-French peel-away sheath placed. A PleurX tunneled catheter was placed. This was tunneled from an incision 5 cm below the peritoneal access to the access site. The catheter was advanced through the peel-away sheath. The sheath was then removed. Final catheter positioning was confirmed with a fluoroscopic spot image. The peritoneal access incision was closed with subcutaneous 3-0 Monocryl and subcuticular 4-0 Vicryl. Dermabond was applied to the incision. A Prolene retention suture was applied at the catheter exit site. Large volume paracentesis was performed through the new catheter utilizing drainage bottles. COMPLICATIONS: None. FINDINGS: The catheter was placed via the right abdominal wall. Approximately 8 liters of ascites was able to be removed after catheter placement. IMPRESSION: Placement of tunneled peritoneal drainage catheter via right abdominal approach. 8 liters of ascites was removed today after catheter placement. Electronically Signed   By: Aletta Edouard M.D.   On: 10/17/2017 10:32        Scheduled Meds: .  ceFAZolin  2 g Other To OR  . feeding supplement (PRO-STAT SUGAR FREE 64)  30 mL Oral BID  . fentaNYL      . lactose free nutrition  237 mL Oral TID WC  . lactulose  20 g Oral Daily  . lidocaine      . midazolam      . sodium chloride flush  3 mL Intravenous Q12H  . tamsulosin  0.4 mg Oral Daily  . thiamine  100 mg Oral Daily   Continuous Infusions: . sodium chloride Stopped (10/09/17 2333)     LOS: 9 days    Time spent: 25 mins.More than 50% of that time was spent in counseling and/or coordination of care.      Shelly Coss, MD [facility] Pager [phone]  If 7PM-7AM, please contact night-coverage [email or web address] Password [facility] 10/17/2017, 2:23 PM

## 2017-10-17 NOTE — Progress Notes (Signed)
Pt refused to allow me to even touch his pleurex dressing. He just had 8 Liters drained off today so I have just left it alone. It does have some serosanguinous drainage on 1/4 of the dressing. Will continue to assess and act if needed. Hoyle Barr, RN

## 2017-10-17 NOTE — Procedures (Signed)
Interventional Radiology Procedure Note  Procedure: Peritoneal tunneled PleurX catheter placement  Complications: None  Estimated Blood Loss: < 10 mL  Findings: RLQ approach tunneled PleurX catheter placed.  Large volume paracentesis in progress after placement.  Venetia Night. Kathlene Cote, M.D Pager:  [phone]

## 2017-10-18 LAB — BASIC METABOLIC PANEL
ANION GAP: 5 (ref 5–15)
BUN: 50 mg/dL — ABNORMAL HIGH (ref 8–23)
CALCIUM: 7.9 mg/dL — AB (ref 8.9–10.3)
CO2: 28 mmol/L (ref 22–32)
CREATININE: 2.12 mg/dL — AB (ref 0.61–1.24)
Chloride: 88 mmol/L — ABNORMAL LOW (ref 98–111)
GFR calc non Af Amer: 31 mL/min — ABNORMAL LOW (ref >60)
GFR, EST AFRICAN AMERICAN: 36 mL/min — AB (ref >60)
Glucose, Bld: 103 mg/dL — ABNORMAL HIGH (ref 70–99)
Potassium: 3.7 mmol/L (ref 3.5–5.1)
Sodium: 121 mmol/L — ABNORMAL LOW (ref 135–145)

## 2017-10-18 MED ORDER — TAMSULOSIN HCL 0.4 MG PO CAPS: 0.4000 mg | ORAL_CAPSULE | Freq: Every day | ORAL | Status: AC

## 2017-10-18 NOTE — Progress Notes (Signed)
Occupational Therapy Treatment Patient Details Name: DEMERE DOTZLER MRN: [id number] DOB: [birth year] Today's Date: 10/18/2017    History of present illness 62 y.o. male with medical history significant of  chronic hepatitis C, decompensated hepatic cirrhosis, hepatocellular carcinoma, CKD 3, cachexia thrombocytopenia. Pt admitted with abdominal distention and shortness of breath   OT comments  Pt agreeable this day  Follow Up Recommendations  SNF          Precautions / Restrictions Precautions Precautions: Fall;None       Mobility Bed Mobility Overal bed mobility: Needs Assistance Bed Mobility: Supine to Sit;Sit to Supine     Supine to sit: Min assist Sit to supine: Min assist   General bed mobility comments: good participation this day  Transfers Overall transfer level: Needs assistance Equipment used: Rolling walker (2 wheeled)             General transfer comment: declined to stand but VERY agreeable to sitting        ADL either performed or assessed with clinical judgement   ADL Overall ADL's : Needs assistance/impaired Eating/Feeding: Supervision/ safety;Sitting Eating/Feeding Details (indicate cue type and reason): sittting EOB.  drinking milk Grooming: Set up;Sitting                                 General ADL Comments: pt pleasant this day and did agree to sit EOB. Pt much more pleasant and agreeable than last sessions               Cognition Arousal/Alertness: Awake/alert Behavior During Therapy: WFL for tasks assessed/performed Overall Cognitive Status: Within Functional Limits for tasks assessed                                                     Pertinent Vitals/ Pain       Pain Score: 4  Pain Location: general Pain Descriptors / Indicators: Sore Pain Intervention(s): Limited activity within patient's tolerance;Repositioned     Prior Functioning/Environment              Frequency  Min  2X/week        Progress Toward Goals  OT Goals(current goals can now be found in the care plan section)  Progress towards OT goals: Progressing toward goals     Plan Discharge plan remains appropriate       AM-PAC PT "6 Clicks" Daily Activity     Outcome Measure   Help from another person eating meals?: A Little Help from another person taking care of personal grooming?: A Little Help from another person toileting, which includes using toliet, bedpan, or urinal?: A Lot Help from another person bathing (including washing, rinsing, drying)?: A Little Help from another person to put on and taking off regular upper body clothing?: A Little Help from another person to put on and taking off regular lower body clothing?: A Lot 6 Click Score: 16    End of Session    OT Visit Diagnosis: Unsteadiness on feet (R26.81);Other abnormalities of gait and mobility (R26.89);Muscle weakness (generalized) (M62.81);History of falling (Z91.81)   Activity Tolerance Patient tolerated treatment well   Patient Left in bed;with call bell/phone within reach;with bed alarm set   Nurse Communication Mobility status        Time: 9211-9417  OT Time Calculation (min): 10 min  Charges: OT General Charges $OT Visit: 1 Visit OT Treatments $Self Care/Home Management : 8-22 mins  Kari Baars, [place] [phone] Office- [phone]      Morgan Rennert, [name] D 10/18/2017, 12:12 PM

## 2017-10-18 NOTE — Discharge Summary (Signed)
Physician Discharge Summary  Jerry Mcintyre HMC:[id number] DOB: Jan 05, 1956 DOA: 10/08/2017  PCP: Otis Brace, MD  Admit date: 10/08/2017 Discharge date: 10/18/2017  Admitted From: Home Disposition:  Residential Hospice  Discharge Condition:Stable CODE STATUS:DNR Diet recommendation: Low sodium diet  Brief/Interim Summary:  Jerry Mcintyre a 62 y.o.malewith medical history significant of chronic hepatitis C( from [place]), decompensated hepatic cirrhosis, CKD 3, cachexia,thrombocytopenia. He presented secondary to abdominal distension and dyspnea.  Patient underwent paracentesis x3 along with peritoneal catheter placement  during this hospitalization.  He underwent  palliative care evaluation .Patient is being considered for discharge to residential hospice. There is plan for placement of peritoneal catheter for drainage of the ascites fluid but patient has not decided yet. Patient also had developed issues with urinary retention but is denying Foley catheter placement and in and out catheterization.Planning for DC to residential hospice as soon as possible.  Following problems were addressed during his hospitalization:  Decompensated hepatic cirrhosis/Hepatitis C Patient with a history of requiring multiple paracenteses. He is s/p paracentesis on 9/8 yielding 8 L of fluid.  He underwent repeat paracentesis on 10/12/17 with removal of more than 15 L of fluid .Fluidanalysisdoes not appear to support SBP so antibiotics discontinued. Continue lactulose, may need to increase frequency if no bowel movement. On 10/17/17:Underwent peritoneal catheter placement for palliative reason.  Also underwent large-volume paracentesis.  Urinary retention  Apparently patient has been passing urine by his own now.Patient refused Foley catheter placement and in and out catheterization.  Extensively  discussed about this issue .  I explained that if he retains urine and he refuses Foley, there is a  chance of acute kidney failure and also possible bladder rupture. On tamsulosin.  Hyponatremia Secondary to cirrhosis.Severe.Lasix D/Ced.Mental status on baseline.  Hyperkalemia Improved  Acute kidney injury on CKD stage III Baseline creatinine of 1.5-1.7.  Kidney function slow to improve  Thrombocytopenia In setting of liver disease.Stable.    Anemia Chronic. Stable.  Leukopenia Transient. Resolved.  Advanced comorbidities/deconditioning/debility: Patient seen by palliative care .  Patient interested on comfort care and residential hospice.  Social worker has been consulted.   Discharge Diagnoses:  Principal Problem:   Ascites Active Problems:   Thrombocytopenia (HCC)   Anasarca   Abdominal distension   Chronic hepatitis C without hepatic coma (HCC)   Protein-calorie malnutrition, severe   Hyperkalemia   Hyponatremia   AKI (acute kidney injury) ([place])   Shortness of breath   Acute urinary retention    Discharge Instructions  Discharge Instructions    Diet - low sodium heart healthy   Complete by:  As directed    Increase activity slowly   Complete by:  As directed      Allergies as of 10/18/2017      Reactions   Bee Venom       Medication List    STOP taking these medications   furosemide 20 MG tablet Commonly known as:  LASIX   lactose free nutrition Liqd   pantoprazole 40 MG tablet Commonly known as:  PROTONIX   spironolactone 25 MG tablet Commonly known as:  ALDACTONE     TAKE these medications   lactulose 10 GM/15ML solution Commonly known as:  CHRONULAC Take 30 mLs (20 g total) by mouth daily.   tamsulosin 0.4 MG Caps capsule Commonly known as:  FLOMAX Take 1 capsule (0.4 mg total) by mouth daily. Start taking on:  10/19/2017       Allergies  Allergen Reactions  . Bee  Venom     Consultations: Palliative care  Procedures/Studies: Dg Chest 2 View  Result Date: 10/08/2017 CLINICAL DATA:  Dyspnea, ascites EXAM: CHEST -  2 VIEW COMPARISON:  08/09/2017 chest radiograph. FINDINGS: Very low lung volumes. Stable cardiomediastinal silhouette with normal heart size. No pneumothorax. No pleural effusion. Moderate right basilar scarring versus atelectasis. No pulmonary edema. No acute consolidative airspace disease. IMPRESSION: Very low lung volumes. Moderate right basilar scarring versus atelectasis. Electronically Signed   By: Ilona Sorrel M.D.   On: 10/08/2017 20:00   Ir Guided Niel Hummer W Catheter Placement  Result Date: 10/17/2017 CLINICAL DATA:  End-stage liver disease with cirrhosis, chronic hepatitis-C infection and history of hepatocellular carcinoma. Request for tunneled peritoneal drainage catheter placement for palliative drainage purposes. EXAM: INSERTION OF TUNNELED PERITONEAL DRAINAGE CATHETER ANESTHESIA/SEDATION: 2.0 mg IV Versed; 100 mcg IV Fentanyl. Total Moderate Sedation Time 18 minutes. The patient's level of consciousness and physiologic status were continuously monitored during the procedure by Radiology nursing. MEDICATIONS: 2 g IV Ancef. Antibiotic was administered in an appropriate time interval for the procedure. FLUOROSCOPY TIME:  Less than 6 seconds. PROCEDURE: The procedure, risks, benefits, and alternatives were explained to the patient. Questions regarding the procedure were encouraged and answered. The patient understands and consents to the procedure. A time-out was performed prior to initiating the procedure. The right abdominal wall was prepped with Betadine in a sterile fashion, and a sterile drape was applied covering the operative field. A sterile gown and sterile gloves were used for the procedure. Local anesthesia was provided with 1% Lidocaine. Ultrasound image documentation was performed. Fluoroscopy during the procedure and fluoroscopic spot radiograph confirms appropriate catheter position. After creating a small skin incision, a 19 gauge needle was advanced into the peritoneal cavity under  ultrasound guidance. A guide wire was then advanced under fluoroscopy into the peritoneal cavity. Peritoneal access was dilated serially and a 16-French peel-away sheath placed. A PleurX tunneled catheter was placed. This was tunneled from an incision 5 cm below the peritoneal access to the access site. The catheter was advanced through the peel-away sheath. The sheath was then removed. Final catheter positioning was confirmed with a fluoroscopic spot image. The peritoneal access incision was closed with subcutaneous 3-0 Monocryl and subcuticular 4-0 Vicryl. Dermabond was applied to the incision. A Prolene retention suture was applied at the catheter exit site. Large volume paracentesis was performed through the new catheter utilizing drainage bottles. COMPLICATIONS: None. FINDINGS: The catheter was placed via the right abdominal wall. Approximately 8 liters of ascites was able to be removed after catheter placement. IMPRESSION: Placement of tunneled peritoneal drainage catheter via right abdominal approach. 8 liters of ascites was removed today after catheter placement. Electronically Signed   By: Aletta Edouard M.D.   On: 10/17/2017 10:32   US Paracentesis  Result Date: 10/11/2017 Joaquim Nam, PA-C     10/11/2017  1:19 PM PROCEDURE SUMMARY: Successful image-guided paracentesis from the right lower lateral abdomen. Yielded 15.5 liters of golden yellow fluid. No immediate complications - procedure was stopped at 15.5 liters due to concern for developing hypotension in setting of poor renal function without albumin administration per ordering provider. Residual fluid remains in abdomen post-procedure, patient reports improvement in symptoms. He denies any complaints. Patient tolerated well. Specimen was not sent for labs. Joaquim Nam PA-C 10/11/2017 12:21 PM   US Paracentesis  Result Date: 10/09/2017 INDICATION: Cirrhosis, hepatitis-C, hepatocellular carcinoma, acute renal insufficiency,  recurrent ascites. Request for diagnostic and therapeutic paracentesis up to  8 liters. EXAM: ULTRASOUND GUIDED PARACENTESIS MEDICATIONS: 1% lidocaine 10 mL COMPLICATIONS: None immediate. PROCEDURE: Informed written consent was obtained from the patient after a discussion of the risks, benefits and alternatives to treatment. A timeout was performed prior to the initiation of the procedure. Initial ultrasound scanning demonstrates a large amount of ascites within the right lower abdominal quadrant. The right lower abdomen was prepped and draped in the usual sterile fashion. 1% lidocaine with epinephrine was used for local anesthesia. Following this, a 19 gauge, 7-cm, Yueh catheter was introduced. An ultrasound image was saved for documentation purposes. The paracentesis was performed. The catheter was removed and a dressing was applied. The patient tolerated the procedure well without immediate post procedural complication. FINDINGS: A total of approximately 8 L of clear yellow fluid was removed. Samples were sent to the laboratory as requested by the clinical team. IMPRESSION: Successful ultrasound-guided paracentesis yielding 8 liters of peritoneal fluid. Read by: Gareth Eagle, PA-C Electronically Signed   By: Corrie Mckusick D.O.   On: 10/09/2017 08:59      Subjective: Patient seen and examined the bedside this morning.  No new issues since yesterday.  Remains weak .BP noted to be low this morning.  Discussed about discharge planning to residential hospice today.  Discharge Exam: Vitals:   10/17/17 1300 10/18/17 0542  BP: 112/71 (!) 79/69  Pulse: 65 84  Resp: 16 16  Temp: 98.8 F (37.1 C) 97.7 F (36.5 C)  SpO2: 98% 97%   Vitals:   10/17/17 0955 10/17/17 1000 10/17/17 1300 10/18/17 0542  BP: 113/79 110/74 112/71 (!) 79/69  Pulse: (!) 51 (!) 59 65 84  Resp: 15 15 16 16   Temp:   98.8 F (37.1 C) 97.7 F (36.5 C)  TempSrc:   Oral Oral  SpO2: 94% 97% 98% 97%  Weight:      Height:         General: Pt is alert, awake, chronically looking, weak,cachetic Cardiovascular: RRR, S1/S2 +, no rubs, no gallops Respiratory: CTA bilaterally, no wheezing, no rhonchi Abdominal: Soft, NT, ascites, peritoneal catheter Extremities: no edema, no cyanosis    The results of significant diagnostics from this hospitalization (including imaging, microbiology, ancillary and laboratory) are listed below for reference.     Microbiology: Recent Results (from the past 240 hour(s))  Culture, body fluid-bottle     Status: None   Collection Time: 10/09/17  8:19 AM  Result Value Ref Range Status   Specimen Description PERITONEAL  Final   Special Requests NONE  Final   Culture   Final    NO GROWTH 5 DAYS Performed at [facility], [address], [place], [place] [address]    Report Status 10/14/2017 FINAL  Final  Gram stain     Status: None   Collection Time: 10/09/17  8:19 AM  Result Value Ref Range Status   Specimen Description PERITONEAL  Final   Special Requests NONE  Final   Gram Stain   Final    RARE WBC PRESENT, PREDOMINANTLY MONONUCLEAR NO ORGANISMS SEEN Performed at [facility], [place] [address], [place], [place] [address]    Report Status 10/09/2017 FINAL  Final     Labs: BNP (last 3 results) Recent Labs    01/05/17 0035 10/08/17 1820  BNP 105.2* 25.4   Basic Metabolic Panel: Recent Labs  Lab 10/12/17 0412  10/13/17 0407 10/15/17 0429 10/16/17 0432 10/17/17 0842 10/18/17 0402  NA 127*  --  127* 122* 123* 122*  121*  K 5.8*   < > 4.4 4.6 5.5* 4.2 3.7  CL 97*  --  94* 90* 89* 88* 88*  CO2 24  --  26 25 27 27 28   GLUCOSE 112*  --  115* 109* 119* 93 103*  BUN 45*  --  45* 48* 54* 55* 50*  CREATININE 2.56*  --  2.37* 2.19* 2.24* 1.99* 2.12*  CALCIUM 8.5*  --  8.1* 8.2* 8.6* 8.4* 7.9*  MG 2.3  --   --   --   --   --   --    < > = values in this interval not displayed.   Liver Function Tests: No results for input(s): AST, ALT, ALKPHOS, BILITOT,  PROT, ALBUMIN in the last 168 hours. No results for input(s): LIPASE, AMYLASE in the last 168 hours. No results for input(s): AMMONIA in the last 168 hours. CBC: Recent Labs  Lab 10/12/17 0412  WBC 4.7  HGB 10.7*  HCT 29.6*  MCV 93.7  PLT 80*   Cardiac Enzymes: No results for input(s): CKTOTAL, CKMB, CKMBINDEX, TROPONINI in the last 168 hours. BNP: Invalid input(s): POCBNP CBG: No results for input(s): GLUCAP in the last 168 hours. D-Dimer No results for input(s): DDIMER in the last 72 hours. Hgb A1c No results for input(s): HGBA1C in the last 72 hours. Lipid Profile No results for input(s): CHOL, HDL, LDLCALC, TRIG, CHOLHDL, LDLDIRECT in the last 72 hours. Thyroid function studies No results for input(s): TSH, T4TOTAL, T3FREE, THYROIDAB in the last 72 hours.  Invalid input(s): FREET3 Anemia work up No results for input(s): VITAMINB12, FOLATE, FERRITIN, TIBC, IRON, RETICCTPCT in the last 72 hours. Urinalysis    Component Value Date/Time   COLORURINE AMBER (A) 08/10/2017 0524   APPEARANCEUR CLEAR 08/10/2017 0524   LABSPEC 1.020 08/10/2017 0524   PHURINE 5.0 08/10/2017 0524   GLUCOSEU NEGATIVE 08/10/2017 0524   HGBUR NEGATIVE 08/10/2017 0524   BILIRUBINUR NEGATIVE 08/10/2017 0524   KETONESUR NEGATIVE 08/10/2017 0524   PROTEINUR NEGATIVE 08/10/2017 0524   NITRITE NEGATIVE 08/10/2017 0524   LEUKOCYTESUR NEGATIVE 08/10/2017 0524   Sepsis Labs Invalid input(s): PROCALCITONIN,  WBC,  LACTICIDVEN Microbiology Recent Results (from the past 240 hour(s))  Culture, body fluid-bottle     Status: None   Collection Time: 10/09/17  8:19 AM  Result Value Ref Range Status   Specimen Description PERITONEAL  Final   Special Requests NONE  Final   Culture   Final    NO GROWTH 5 DAYS Performed at [facility], [address] [place], [place] [address]    Report Status 10/14/2017 FINAL  Final  Gram stain     Status: None   Collection Time: 10/09/17  8:19 AM  Result Value  Ref Range Status   Specimen Description PERITONEAL  Final   Special Requests NONE  Final   Gram Stain   Final    RARE WBC PRESENT, PREDOMINANTLY MONONUCLEAR NO ORGANISMS SEEN Performed at [facility], [place] [address], [place], [place] [address]    Report Status 10/09/2017 FINAL  Final    Please note: You were cared for by a hospitalist during your hospital stay. Once you are discharged, your primary care physician will handle any further medical issues. Please note that NO REFILLS for any discharge medications will be authorized once you are discharged, as it is imperative that you return to your primary care physician (or establish a relationship with a primary care physician if you do not  have one) for your post hospital discharge needs so that they can reassess your need for medications and monitor your lab values.    Time coordinating discharge: 40 minutes  SIGNED:   Shelly Coss, MD  [facility] 10/18/2017, 11:29 AM Pager [phone]  If 7PM-7AM, please contact night-coverage [email or web address] Password [email or web address]

## 2017-10-18 NOTE — Progress Notes (Signed)
[facility]  Met with patient to complete paper work for [employer] transfer today. He was alert and oriented and spoke with his sister by phone during this visit. He is relieved that [employer] will waive his room and board charge based on his income.   Discharge summary has been sent.   RN please call report to [phone].  Thank you,  Erling Conte, LCSW [phone]

## 2017-10-18 NOTE — Progress Notes (Signed)
CSW following to assist with patient's discharge to [facility]. Paperwork complete.  PTAR contacted, patient's sister Henry Russel) notified. Patient's RN can call report to [phone], packet complete. CSW signing off, no other needs identified at this time.  Abundio Miu, [place] Social Worker [facility] Cell#: [phone]

## 2017-10-18 NOTE — Progress Notes (Signed)
Referring Physician(s): Adhikari,A  Supervising Physician: Daryll Brod  Patient Status:  [facility] - In-pt  Chief Complaint: Cirrhosis, hepatocellular carcinoma, recurrent ascites   Subjective: Pt awaiting transfer to [facility]; no acute changes   Allergies: Bee venom  Medications: Prior to Admission medications   Medication Sig Start Date End Date Taking? Authorizing Provider  furosemide (LASIX) 20 MG tablet Take 1 tablet (20 mg total) by mouth daily. 08/17/17  Yes Irene Pap N, DO  lactulose (CHRONULAC) 10 GM/15ML solution Take 30 mLs (20 g total) by mouth daily. 08/17/17  Yes Kayleen Memos, DO  spironolactone (ALDACTONE) 25 MG tablet Take 1 tablet (25 mg total) by mouth daily. 08/17/17  Yes Kayleen Memos, DO  lactose free nutrition (BOOST) LIQD Take 237 mLs by mouth 3 (three) times daily between meals. Patient not taking: Reported on 10/08/2017 08/16/17   Kayleen Memos, DO  pantoprazole (PROTONIX) 40 MG tablet Take 1 tablet (40 mg total) by mouth daily. Patient not taking: Reported on 10/08/2017 08/16/17   Kayleen Memos, DO  tamsulosin (FLOMAX) 0.4 MG CAPS capsule Take 1 capsule (0.4 mg total) by mouth daily. 10/19/17   Shelly Coss, MD     Vital Signs: BP (!) 79/69 (BP Location: Left Arm)   Pulse 84   Temp 97.7 F (36.5 C) (Oral)   Resp 16   Ht 5\' 9"  (1.753 m)   Wt 156 lb 11.2 oz (71.1 kg)   SpO2 97%   BMI 23.14 kg/m   Physical Exam RLQ drain intact, insertion site with small amt blood on sponge gauze, abd sl dist, mildly tender  Imaging: Ir Lenise Arena W Catheter Placement  Result Date: 10/17/2017 CLINICAL DATA:  End-stage liver disease with cirrhosis, chronic hepatitis-C infection and history of hepatocellular carcinoma. Request for tunneled peritoneal drainage catheter placement for palliative drainage purposes. EXAM: INSERTION OF TUNNELED PERITONEAL DRAINAGE CATHETER ANESTHESIA/SEDATION: 2.0 mg IV Versed; 100 mcg IV Fentanyl. Total Moderate Sedation Time  18 minutes. The patient's level of consciousness and physiologic status were continuously monitored during the procedure by Radiology nursing. MEDICATIONS: 2 g IV Ancef. Antibiotic was administered in an appropriate time interval for the procedure. FLUOROSCOPY TIME:  Less than 6 seconds. PROCEDURE: The procedure, risks, benefits, and alternatives were explained to the patient. Questions regarding the procedure were encouraged and answered. The patient understands and consents to the procedure. A time-out was performed prior to initiating the procedure. The right abdominal wall was prepped with Betadine in a sterile fashion, and a sterile drape was applied covering the operative field. A sterile gown and sterile gloves were used for the procedure. Local anesthesia was provided with 1% Lidocaine. Ultrasound image documentation was performed. Fluoroscopy during the procedure and fluoroscopic spot radiograph confirms appropriate catheter position. After creating a small skin incision, a 19 gauge needle was advanced into the peritoneal cavity under ultrasound guidance. A guide wire was then advanced under fluoroscopy into the peritoneal cavity. Peritoneal access was dilated serially and a 16-French peel-away sheath placed. A PleurX tunneled catheter was placed. This was tunneled from an incision 5 cm below the peritoneal access to the access site. The catheter was advanced through the peel-away sheath. The sheath was then removed. Final catheter positioning was confirmed with a fluoroscopic spot image. The peritoneal access incision was closed with subcutaneous 3-0 Monocryl and subcuticular 4-0 Vicryl. Dermabond was applied to the incision. A Prolene retention suture was applied at the catheter exit site. Large volume paracentesis was performed through  the new catheter utilizing drainage bottles. COMPLICATIONS: None. FINDINGS: The catheter was placed via the right abdominal wall. Approximately 8 liters of ascites was  able to be removed after catheter placement. IMPRESSION: Placement of tunneled peritoneal drainage catheter via right abdominal approach. 8 liters of ascites was removed today after catheter placement. Electronically Signed   By: Aletta Edouard M.D.   On: 10/17/2017 10:32    Labs:  CBC: Recent Labs    10/09/17 0414 10/10/17 0440 10/11/17 0356 10/12/17 0412  WBC 3.1* 4.3 4.1 4.7  HGB 10.1* 10.8* 10.6* 10.7*  HCT 28.3* 30.2* 29.7* 29.6*  PLT 107* 91* 93* 80*    COAGS: Recent Labs    01/05/17 0032  04/02/17 0459 08/09/17 1836 10/08/17 1938 10/09/17 0414  INR  --    < > 1.85 1.71 1.29 1.34  APTT 28  --   --   --   --   --    < > = values in this interval not displayed.    BMP: Recent Labs    10/15/17 0429 10/16/17 0432 10/17/17 0842 10/18/17 0402  NA 122* 123* 122* 121*  K 4.6 5.5* 4.2 3.7  CL 90* 89* 88* 88*  CO2 25 27 27 28   GLUCOSE 109* 119* 93 103*  BUN 48* 54* 55* 50*  CALCIUM 8.2* 8.6* 8.4* 7.9*  CREATININE 2.19* 2.24* 1.99* 2.12*  GFRNONAA 30* 29* 34* 31*  GFRAA 35* 34* 39* 36*    LIVER FUNCTION TESTS: Recent Labs    08/14/17 0533 10/08/17 1835 10/09/17 0414 10/10/17 0440  BILITOT 3.1* 3.4* 2.3* 2.5*  AST 34 73* 75* 64*  ALT 12 25 28 23   ALKPHOS 24* 65 61 56  PROT 6.1* 7.6 7.8 7.2  ALBUMIN 4.3 3.2* 3.5 3.0*    Assessment and Plan: Pt with ESLD/cirrhosis/hep C, recurrent ascites; s/p perit drain 9/16; afebrile; drain ascites prn; other plans as per primary/hospice services   Electronically Signed: D. Rowe Robert, PA-C 10/18/2017, 4:05 PM   I spent a total of 15 minutes at the the patient's bedside AND on the patient's hospital floor or unit, greater than 50% of which was counseling/coordinating care for peritoneal drain    Patient ID: Jerry Mcintyre, male   DOB: May 25, 1955, 62 y.o.   MRN: [id number]

## 2017-11-01 DEATH — deceased

## 2018-12-02 IMAGING — CR DG CHEST 2V
2 series · 2 of 2 positions shown · non-contrast
Comparison: 08/09/2017 chest radiograph.

CLINICAL DATA: Dyspnea, ascites

EXAM:
CHEST - 2 VIEW

[w chest lat]
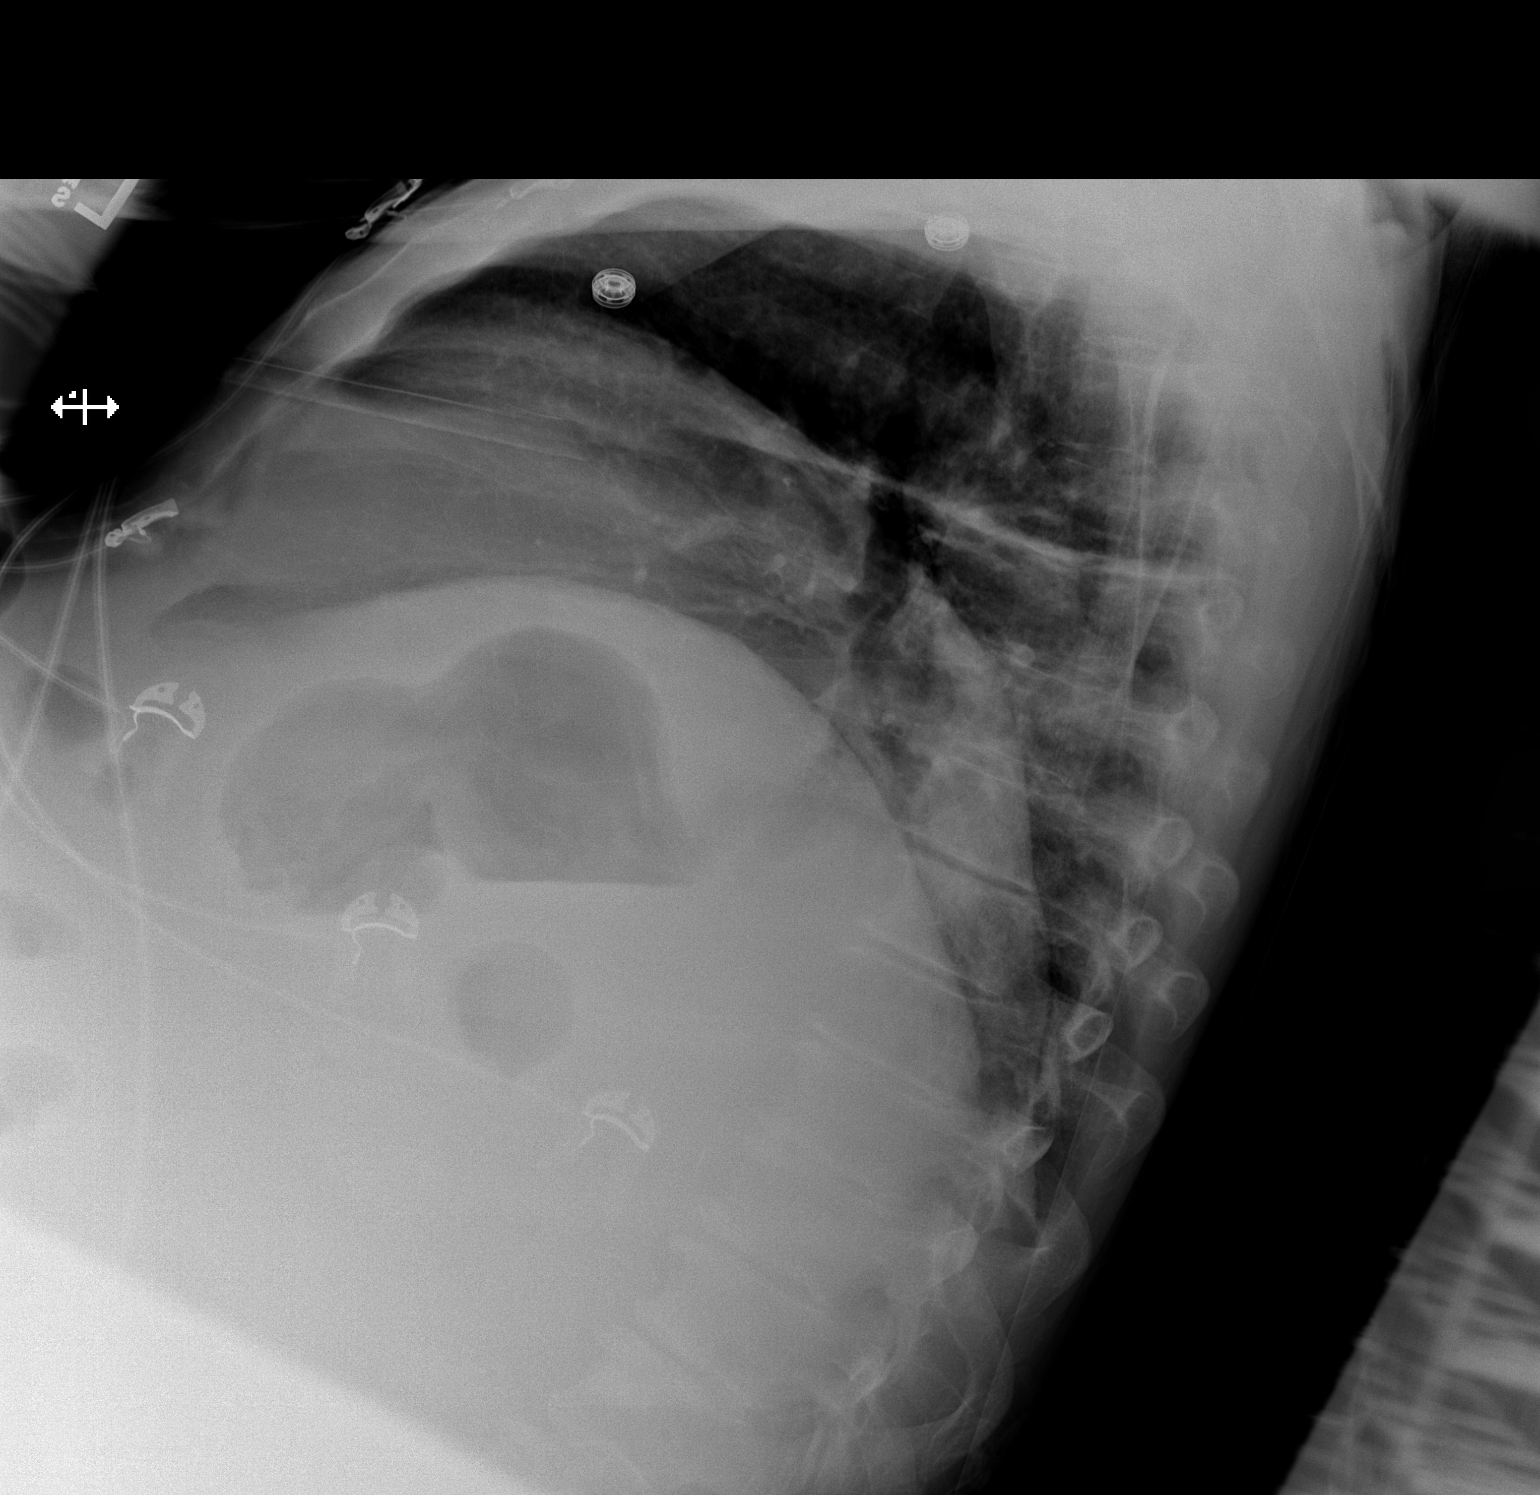

[x chest ap]
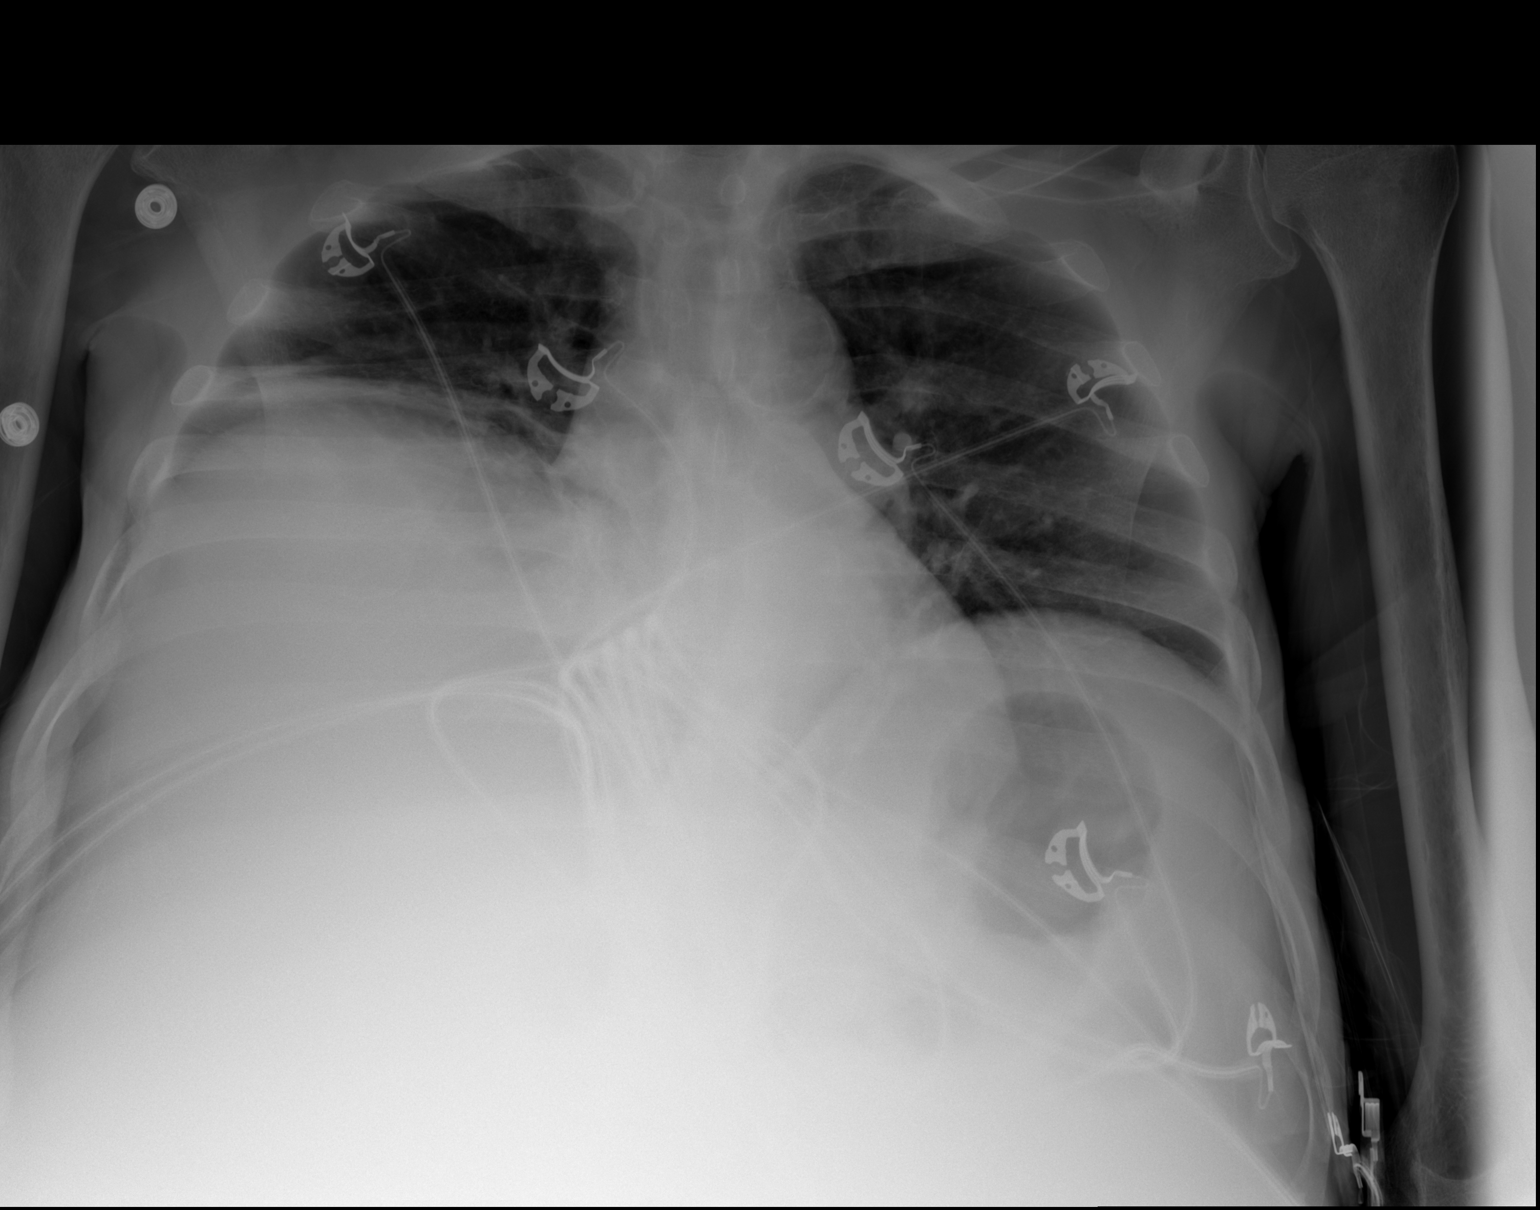

[2 of 2 positions shown; findings below may reference images not displayed]

FINDINGS: Very low lung volumes. Stable cardiomediastinal silhouette with
normal heart size. No pneumothorax. No pleural effusion. Moderate
right basilar scarring versus atelectasis. No pulmonary edema. No
acute consolidative airspace disease.
IMPRESSION: Very low lung volumes. Moderate right basilar scarring versus
atelectasis.
